# Patient Record
Sex: Female | Born: 1980 | Race: White | Hispanic: No | Smoking: Current every day smoker
Health system: Southern US, Community
[De-identification: ages and names within clinical notes are randomized; demographics above are authoritative.]

## PROBLEM LIST (undated history)

## (undated) DIAGNOSIS — M797 Fibromyalgia: Secondary | ICD-10-CM

## (undated) DIAGNOSIS — J45909 Unspecified asthma, uncomplicated: Secondary | ICD-10-CM

## (undated) DIAGNOSIS — F319 Bipolar disorder, unspecified: Secondary | ICD-10-CM

## (undated) DIAGNOSIS — F4310 Post-traumatic stress disorder, unspecified: Secondary | ICD-10-CM

## (undated) DIAGNOSIS — G2581 Restless legs syndrome: Secondary | ICD-10-CM

## (undated) DIAGNOSIS — F603 Borderline personality disorder: Secondary | ICD-10-CM

## (undated) DIAGNOSIS — M329 Systemic lupus erythematosus, unspecified: Secondary | ICD-10-CM

## (undated) DIAGNOSIS — F419 Anxiety disorder, unspecified: Secondary | ICD-10-CM

## (undated) DIAGNOSIS — J020 Streptococcal pharyngitis: Secondary | ICD-10-CM

## (undated) DIAGNOSIS — F32A Depression, unspecified: Secondary | ICD-10-CM

## (undated) DIAGNOSIS — R42 Dizziness and giddiness: Secondary | ICD-10-CM

## (undated) DIAGNOSIS — H6690 Otitis media, unspecified, unspecified ear: Secondary | ICD-10-CM

## (undated) DIAGNOSIS — IMO0002 Reserved for concepts with insufficient information to code with codable children: Secondary | ICD-10-CM

## (undated) DIAGNOSIS — F329 Major depressive disorder, single episode, unspecified: Secondary | ICD-10-CM

## (undated) DIAGNOSIS — M1990 Unspecified osteoarthritis, unspecified site: Secondary | ICD-10-CM

## (undated) DIAGNOSIS — N920 Excessive and frequent menstruation with regular cycle: Secondary | ICD-10-CM

## (undated) HISTORY — PX: TYMPANOSTOMY: SHX2586

## (undated) HISTORY — PX: TUBAL LIGATION: SHX77

## (undated) HISTORY — DX: Excessive and frequent menstruation with regular cycle: N92.0

## (undated) HISTORY — PX: CHOLECYSTECTOMY: SHX55

## (undated) HISTORY — PX: ABDOMINAL HYSTERECTOMY: SHX81

## (undated) HISTORY — PX: TONSILLECTOMY: SUR1361

## (undated) HISTORY — DX: Fibromyalgia: M79.7

## (undated) HISTORY — DX: Restless legs syndrome: G25.81

---

## 2008-07-17 ENCOUNTER — Emergency Department (HOSPITAL_BASED_OUTPATIENT_CLINIC_OR_DEPARTMENT_OTHER): Admission: EM | Admit: 2008-07-17 | Discharge: 2008-07-17 | Payer: Self-pay | Admitting: Emergency Medicine

## 2008-07-17 ENCOUNTER — Ambulatory Visit: Payer: Self-pay | Admitting: Diagnostic Radiology

## 2009-02-28 ENCOUNTER — Emergency Department (HOSPITAL_BASED_OUTPATIENT_CLINIC_OR_DEPARTMENT_OTHER): Admission: EM | Admit: 2009-02-28 | Discharge: 2009-03-01 | Payer: Self-pay | Admitting: Emergency Medicine

## 2009-03-01 ENCOUNTER — Ambulatory Visit: Payer: Self-pay | Admitting: Interventional Radiology

## 2010-01-23 ENCOUNTER — Emergency Department (HOSPITAL_BASED_OUTPATIENT_CLINIC_OR_DEPARTMENT_OTHER): Admission: EM | Admit: 2010-01-23 | Discharge: 2010-01-24 | Payer: Self-pay | Admitting: Emergency Medicine

## 2010-01-23 ENCOUNTER — Ambulatory Visit: Payer: Self-pay | Admitting: Diagnostic Radiology

## 2010-06-04 ENCOUNTER — Emergency Department (HOSPITAL_BASED_OUTPATIENT_CLINIC_OR_DEPARTMENT_OTHER)
Admission: EM | Admit: 2010-06-04 | Discharge: 2010-06-04 | Disposition: A | Discharge status: Home / Self Care | Payer: Self-pay | Attending: Emergency Medicine | Admitting: Emergency Medicine

## 2010-06-04 DIAGNOSIS — F319 Bipolar disorder, unspecified: Secondary | ICD-10-CM | POA: Insufficient documentation

## 2010-06-04 DIAGNOSIS — J069 Acute upper respiratory infection, unspecified: Secondary | ICD-10-CM | POA: Insufficient documentation

## 2010-06-04 DIAGNOSIS — J029 Acute pharyngitis, unspecified: Secondary | ICD-10-CM | POA: Insufficient documentation

## 2010-06-04 DIAGNOSIS — J3489 Other specified disorders of nose and nasal sinuses: Secondary | ICD-10-CM | POA: Insufficient documentation

## 2010-06-04 DIAGNOSIS — F17200 Nicotine dependence, unspecified, uncomplicated: Secondary | ICD-10-CM | POA: Insufficient documentation

## 2010-06-06 LAB — URINALYSIS, ROUTINE W REFLEX MICROSCOPIC
Bilirubin Urine: NEGATIVE
Glucose, UA: NEGATIVE mg/dL
Hgb urine dipstick: NEGATIVE
Nitrite: NEGATIVE
Specific Gravity, Urine: 1.01 (ref 1.005–1.030)
pH: 5.5 (ref 5.0–8.0)

## 2010-06-06 LAB — URINE MICROSCOPIC-ADD ON

## 2010-06-06 LAB — DIFFERENTIAL
Basophils Relative: 1 % (ref 0–1)
Eosinophils Absolute: 0.5 10*3/uL (ref 0.0–0.7)
Lymphs Abs: 2.8 10*3/uL (ref 0.7–4.0)
Neutrophils Relative %: 60 % (ref 43–77)

## 2010-06-06 LAB — COMPREHENSIVE METABOLIC PANEL
ALT: 20 U/L (ref 0–35)
AST: 20 U/L (ref 0–37)
CO2: 24 mEq/L (ref 19–32)
Calcium: 10.1 mg/dL (ref 8.4–10.5)
GFR calc Af Amer: >60 mL/min (ref >60)
Sodium: 146 mEq/L — ABNORMAL HIGH (ref 135–145)
Total Protein: 8.7 g/dL — ABNORMAL HIGH (ref 6.0–8.3)

## 2010-06-06 LAB — PREGNANCY, URINE: Preg Test, Ur: NEGATIVE

## 2010-06-06 LAB — WET PREP, GENITAL: Trich, Wet Prep: NONE SEEN

## 2010-06-06 LAB — CBC
MCV: 90.5 fL (ref 78.0–100.0)
Platelets: 326 10*3/uL (ref 150–400)
WBC: 10.4 10*3/uL (ref 4.0–10.5)

## 2010-06-10 ENCOUNTER — Emergency Department (HOSPITAL_BASED_OUTPATIENT_CLINIC_OR_DEPARTMENT_OTHER)
Admission: EM | Admit: 2010-06-10 | Discharge: 2010-06-10 | Disposition: A | Discharge status: Home / Self Care | Payer: Self-pay | Attending: Emergency Medicine | Admitting: Emergency Medicine

## 2010-06-10 DIAGNOSIS — F319 Bipolar disorder, unspecified: Secondary | ICD-10-CM | POA: Insufficient documentation

## 2010-06-10 DIAGNOSIS — H9209 Otalgia, unspecified ear: Secondary | ICD-10-CM | POA: Insufficient documentation

## 2010-07-28 ENCOUNTER — Emergency Department (HOSPITAL_BASED_OUTPATIENT_CLINIC_OR_DEPARTMENT_OTHER)
Admission: EM | Admit: 2010-07-28 | Discharge: 2010-07-28 | Disposition: A | Discharge status: Home / Self Care | Payer: Self-pay | Attending: Emergency Medicine | Admitting: Emergency Medicine

## 2010-07-28 DIAGNOSIS — R51 Headache: Secondary | ICD-10-CM | POA: Insufficient documentation

## 2010-07-28 DIAGNOSIS — F319 Bipolar disorder, unspecified: Secondary | ICD-10-CM | POA: Insufficient documentation

## 2010-10-31 ENCOUNTER — Emergency Department (HOSPITAL_BASED_OUTPATIENT_CLINIC_OR_DEPARTMENT_OTHER)
Admission: EM | Admit: 2010-10-31 | Discharge: 2010-10-31 | Disposition: A | Discharge status: Home / Self Care | Payer: Self-pay | Attending: Emergency Medicine | Admitting: Emergency Medicine

## 2010-10-31 DIAGNOSIS — R110 Nausea: Secondary | ICD-10-CM | POA: Insufficient documentation

## 2010-10-31 DIAGNOSIS — F17200 Nicotine dependence, unspecified, uncomplicated: Secondary | ICD-10-CM | POA: Insufficient documentation

## 2010-10-31 DIAGNOSIS — R51 Headache: Secondary | ICD-10-CM | POA: Insufficient documentation

## 2010-10-31 DIAGNOSIS — F3289 Other specified depressive episodes: Secondary | ICD-10-CM | POA: Insufficient documentation

## 2010-10-31 DIAGNOSIS — F329 Major depressive disorder, single episode, unspecified: Secondary | ICD-10-CM | POA: Insufficient documentation

## 2010-10-31 HISTORY — DX: Depression, unspecified: F32.A

## 2010-10-31 HISTORY — DX: Major depressive disorder, single episode, unspecified: F32.9

## 2010-10-31 MED ORDER — METOCLOPRAMIDE HCL 5 MG/ML IJ SOLN
10.0000 mg | Freq: Once | INTRAMUSCULAR | Status: AC
Start: 2010-10-31 — End: 2010-10-31
  Administered 2010-10-31: 10 mg via INTRAMUSCULAR
  Filled 2010-10-31: qty 2

## 2010-10-31 MED ORDER — KETOROLAC TROMETHAMINE 60 MG/2ML IM SOLN
60.0000 mg | Freq: Once | INTRAMUSCULAR | Status: AC
Start: 2010-10-31 — End: 2010-10-31
  Administered 2010-10-31: 60 mg via INTRAMUSCULAR
  Filled 2010-10-31: qty 2

## 2010-10-31 MED ORDER — DIPHENHYDRAMINE HCL 50 MG/ML IJ SOLN
25.0000 mg | Freq: Once | INTRAMUSCULAR | Status: AC
  Administered 2010-10-31: 50 mg via INTRAMUSCULAR
  Filled 2010-10-31: qty 1

## 2010-10-31 NOTE — ED Provider Notes (Signed)
Medical screening examination/treatment/procedure(s) were performed by non-physician practitioner and as supervising physician I was immediately available for consultation/collaboration.   Eraina Winnie B. Bernette [name], MD 10/31/10 1610

## 2010-10-31 NOTE — ED Notes (Signed)
Migraine headache since yesterday; nausea and light sensitivity

## 2010-10-31 NOTE — ED Provider Notes (Signed)
History     CSN: [id number] Arrival date & time: 10/31/2010  5:40 PM  Chief Complaint  Patient presents with  . Migraine   Patient is a 30 y.o. female presenting with headaches. The history is provided by the patient. No language interpreter was used.  Headache  This is a recurrent problem. The current episode started 6 to 12 hours ago. The problem occurs constantly. The problem has not changed since onset.The headache is associated with nothing. The pain is located in the right unilateral region. The quality of the pain is described as throbbing. The pain is moderate. The pain does not radiate. Associated symptoms include a fever, chest pressure and nausea. Pertinent negatives include no syncope and no vomiting. She has tried aspirin for the symptoms. The treatment provided no relief.    Past Medical History  Diagnosis Date  . Migraine   . Depression     Past Surgical History  Procedure Date  . Tonsillectomy   . Cholecystectomy   . Exploratory laparotomy   . Tubal ligation     No family history on file.  History  Substance Use Topics  . Smoking status: Current Everyday Smoker -- 1.0 packs/day  . Smokeless tobacco: Not on file  . Alcohol Use: No    OB History    Grav Para Term Preterm Abortions TAB SAB Ect Mult Living                  Review of Systems  Constitutional: Positive for fever.  Cardiovascular: Negative for syncope.  Gastrointestinal: Positive for nausea. Negative for vomiting.  Neurological: Positive for headaches.  All other systems reviewed and are negative.    Physical Exam  BP 128/70  Pulse 81  Temp(Src) 99.2 F (37.3 C) (Oral)  Resp 16  Ht 5\' 1"  (1.549 m)  Wt 165 lb (74.844 kg)  BMI 31.18 kg/m2  SpO2 99%  LMP 10/24/2010  Physical Exam  Nursing note and vitals reviewed. HENT:  Head: Normocephalic and atraumatic.  Eyes: Pupils are equal, round, and reactive to light.  Neck: Normal range of motion. Neck supple.  Cardiovascular:  Regular rhythm.   Pulmonary/Chest: Effort normal and breath sounds normal.  Abdominal: Soft.  Musculoskeletal: Normal range of motion.  Neurological: She is alert.  Skin: Skin is warm and dry.    ED Course  Procedures  MDM Pt is feeling a lot better at this time      Teressa Lower, NP 10/31/10 1840

## 2011-03-05 ENCOUNTER — Emergency Department (HOSPITAL_BASED_OUTPATIENT_CLINIC_OR_DEPARTMENT_OTHER)
Admission: EM | Admit: 2011-03-05 | Discharge: 2011-03-05 | Disposition: A | Discharge status: Home / Self Care | Payer: Self-pay | Attending: Emergency Medicine | Admitting: Emergency Medicine

## 2011-03-05 ENCOUNTER — Encounter (HOSPITAL_BASED_OUTPATIENT_CLINIC_OR_DEPARTMENT_OTHER): Payer: Self-pay

## 2011-03-05 DIAGNOSIS — H9209 Otalgia, unspecified ear: Secondary | ICD-10-CM | POA: Insufficient documentation

## 2011-03-05 DIAGNOSIS — F17200 Nicotine dependence, unspecified, uncomplicated: Secondary | ICD-10-CM | POA: Insufficient documentation

## 2011-03-05 DIAGNOSIS — J069 Acute upper respiratory infection, unspecified: Secondary | ICD-10-CM | POA: Insufficient documentation

## 2011-03-05 MED ORDER — OXYMETAZOLINE HCL 0.05 % NA SOLN
NASAL | Status: AC
  Administered 2011-03-05: 1 via NASAL
  Filled 2011-03-05: qty 15

## 2011-03-05 MED ORDER — OXYMETAZOLINE HCL 0.05 % NA SOLN
1.0000 | Freq: Once | NASAL | Status: AC
  Administered 2011-03-05: 1 via NASAL

## 2011-03-05 NOTE — ED Provider Notes (Signed)
History    This chart was scribed for Hilario Quarry, MD, MD by Smitty Pluck. The patient was seen in room MHCT1 and the patient's care was started at 3:09PM.   CSN: [id number]  Arrival date & time 03/05/11  1419   First MD Initiated Contact with Patient 03/05/11 1506      Chief Complaint  Patient presents with  . Otalgia  . URI    (Consider location/radiation/quality/duration/timing/severity/associated sxs/prior treatment) The history is provided by the patient.   Tiffany Whitney is a 30 y.o. female who presents to the Emergency Department complaining of moderate right otalgia and persistent cough onset 6 days ago. Pt  Has been using Mucinex for cough with minor relief. The symptoms have been constant since the onset. There is no radiation. Pt is taking Abilify, clafifil and prestik.   Past Medical History  Diagnosis Date  . Migraine   . Depression     Past Surgical History  Procedure Date  . Tonsillectomy   . Cholecystectomy   . Exploratory laparotomy   . Tubal ligation     No family history on file.  History  Substance Use Topics  . Smoking status: Current Everyday Smoker -- 1.0 packs/day  . Smokeless tobacco: Not on file  . Alcohol Use: No    OB History    Grav Para Term Preterm Abortions TAB SAB Ect Mult Living                  Review of Systems  All other systems reviewed and are negative.  10 Systems reviewed and are negative for acute change except as noted in the HPI.   Allergies  Morphine and related  Home Medications   Current Outpatient Rx  Name Route Sig Dispense Refill  . ARIPIPRAZOLE 5 MG PO TABS Oral Take 5 mg by mouth daily.      Marlin Canary HEADACHE PO Oral Take 1 packet by mouth once.      . DESVENLAFAXINE SUCCINATE ER 50 MG PO TB24 Oral Take 50 mg by mouth daily.      . QUETIAPINE FUMARATE 100 MG PO TABS Oral Take 150 mg by mouth at bedtime.        BP 137/85  Pulse 96  Temp(Src) 97.7 F (36.5 C) (Oral)  Resp 16  Ht 5\' 1"  (1.549  m)  Wt 200 lb (90.719 kg)  BMI 37.79 kg/m2  SpO2 97%  LMP 03/03/2011  Physical Exam  Nursing note and vitals reviewed. Constitutional: She is oriented to person, place, and time. She appears well-developed and well-nourished. No distress.  HENT:  Head: Normocephalic and atraumatic.  Eyes: Conjunctivae and EOM are normal. Pupils are equal, round, and reactive to light.  Neck: Normal range of motion. Neck supple.  Cardiovascular: Normal rate, regular rhythm and normal heart sounds.   Pulmonary/Chest: Effort normal and breath sounds normal. No respiratory distress.  Abdominal: She exhibits no distension.  Musculoskeletal: Normal range of motion. She exhibits no edema.  Neurological: She is alert and oriented to person, place, and time.  Skin: Skin is warm and dry.  Psychiatric: She has a normal mood and affect. Her behavior is normal.    ED Course  Procedures (including critical care time)  DIAGNOSTIC STUDIES: Oxygen Saturation is 98% on room air, normal by my interpretation.    COORDINATION OF CARE:    Labs Reviewed - No data to display No results found.   No diagnosis found.    Hilario Quarry, MD  I personally performed the services described in this documentation, which was scribed in my presence. The recorded information has been reviewed and considered.      Hilario Quarry, MD 03/05/11 [phone]

## 2011-03-05 NOTE — ED Notes (Signed)
Right earache, cough started 12/25

## 2011-05-04 ENCOUNTER — Emergency Department (HOSPITAL_BASED_OUTPATIENT_CLINIC_OR_DEPARTMENT_OTHER)
Admission: EM | Admit: 2011-05-04 | Discharge: 2011-05-04 | Disposition: A | Discharge status: Home / Self Care | Payer: Self-pay | Attending: Emergency Medicine | Admitting: Emergency Medicine

## 2011-05-04 ENCOUNTER — Encounter (HOSPITAL_BASED_OUTPATIENT_CLINIC_OR_DEPARTMENT_OTHER): Payer: Self-pay | Admitting: Emergency Medicine

## 2011-05-04 DIAGNOSIS — H9209 Otalgia, unspecified ear: Secondary | ICD-10-CM | POA: Insufficient documentation

## 2011-05-04 DIAGNOSIS — H6690 Otitis media, unspecified, unspecified ear: Secondary | ICD-10-CM | POA: Insufficient documentation

## 2011-05-04 DIAGNOSIS — F17200 Nicotine dependence, unspecified, uncomplicated: Secondary | ICD-10-CM | POA: Insufficient documentation

## 2011-05-04 DIAGNOSIS — J029 Acute pharyngitis, unspecified: Secondary | ICD-10-CM | POA: Insufficient documentation

## 2011-05-04 MED ORDER — AMOXICILLIN 500 MG PO CAPS: 500.0000 mg | ORAL_CAPSULE | Freq: Three times a day (TID) | ORAL | Status: AC

## 2011-05-04 MED ORDER — HYDROCODONE-ACETAMINOPHEN 5-325 MG PO TABS: 2.0000 | ORAL_TABLET | ORAL | Status: AC | PRN

## 2011-05-04 NOTE — ED Notes (Signed)
Pt c/o sore throat, right ear pain, congestion and fever since mon.

## 2011-05-04 NOTE — ED Provider Notes (Signed)
History     CSN: [id number]  Arrival date & time 05/04/11  1859   First MD Initiated Contact with Patient 05/04/11 1925      Chief Complaint  Patient presents with  . Sore Throat  . Fever  . Nasal Congestion  . Otalgia    (Consider location/radiation/quality/duration/timing/severity/associated sxs/prior treatment) Patient is a 31 y.o. female presenting with pharyngitis, fever, and ear pain. The history is provided by the patient. No language interpreter was used.  Sore Throat This is a new problem. The current episode started in the past 7 days. The problem occurs daily. The problem has been gradually worsening. Associated symptoms include a fever and a sore throat. The symptoms are aggravated by nothing. She has tried nothing for the symptoms. The treatment provided moderate relief.  Fever Primary symptoms of the febrile illness include fever.  Otalgia Associated symptoms include sore throat.   Pt complains of pain in bilat ears right greater than left.   Pt complains of a sorethroat Past Medical History  Diagnosis Date  . Migraine   . Depression     Past Surgical History  Procedure Date  . Tonsillectomy   . Cholecystectomy   . Exploratory laparotomy   . Tubal ligation   . Exploratory laparotomy     No family history on file.  History  Substance Use Topics  . Smoking status: Current Everyday Smoker -- 1.0 packs/day  . Smokeless tobacco: Not on file  . Alcohol Use: No    OB History    Grav Para Term Preterm Abortions TAB SAB Ect Mult Living                  Review of Systems  Constitutional: Positive for fever.  HENT: Positive for ear pain and sore throat.   All other systems reviewed and are negative.    Allergies  Morphine and related  Home Medications   Current Outpatient Rx  Name Route Sig Dispense Refill  . ARIPIPRAZOLE 5 MG PO TABS Oral Take 5 mg by mouth daily.      Marlin Canary HEADACHE PO Oral Take 1 packet by mouth once.      . ASPIRIN  EFFERVESCENT 325 MG PO TBEF Oral Take 325 mg by mouth every 6 (six) hours as needed. Patient took this medication for flu symptoms.    . DESVENLAFAXINE SUCCINATE ER 50 MG PO TB24 Oral Take 50 mg by mouth daily.      . QUETIAPINE FUMARATE 100 MG PO TABS Oral Take 150 mg by mouth at bedtime.        BP 117/76  Pulse 100  Temp(Src) 98.5 F (36.9 C) (Oral)  Resp 18  Wt 192 lb (87.091 kg)  SpO2 100%  Physical Exam  Vitals reviewed. Constitutional: She is oriented to person, place, and time. She appears well-developed and well-nourished.  HENT:  Head: Normocephalic and atraumatic.  Nose: Nose normal.  Mouth/Throat: No oropharyngeal exudate.       Right tm erythematous, fluid,  Throat erythematous  Eyes: Conjunctivae and EOM are normal. Pupils are equal, round, and reactive to light.  Neck: Normal range of motion. Neck supple.  Cardiovascular: Normal rate.   Pulmonary/Chest: Effort normal.  Abdominal: Soft.  Musculoskeletal: Normal range of motion.  Neurological: She is alert and oriented to person, place, and time. She has normal reflexes.  Skin: Skin is warm.  Psychiatric: She has a normal mood and affect.    ED Course  Procedures (including critical care time)  Labs Reviewed - No data to display No results found.   No diagnosis found.    MDM   Pt given 2 vicodin for pain.  Pt given rx for amoxicillian and hydrocodone  Langston Masker, [place] 05/04/11 2013

## 2011-05-04 NOTE — Discharge Instructions (Signed)

## 2011-05-04 NOTE — ED Provider Notes (Signed)
Medical screening examination/treatment/procedure(s) were performed by non-physician practitioner and as supervising physician I was immediately available for consultation/collaboration.   Denissa Cozart A Winfred Redel, MD 05/04/11 2349 

## 2011-06-03 ENCOUNTER — Encounter (HOSPITAL_BASED_OUTPATIENT_CLINIC_OR_DEPARTMENT_OTHER): Payer: Self-pay | Admitting: *Deleted

## 2011-06-03 ENCOUNTER — Emergency Department (HOSPITAL_BASED_OUTPATIENT_CLINIC_OR_DEPARTMENT_OTHER)
Admission: EM | Admit: 2011-06-03 | Discharge: 2011-06-03 | Disposition: A | Discharge status: Home / Self Care | Payer: Self-pay | Attending: Emergency Medicine | Admitting: Emergency Medicine

## 2011-06-03 DIAGNOSIS — G43909 Migraine, unspecified, not intractable, without status migrainosus: Secondary | ICD-10-CM | POA: Insufficient documentation

## 2011-06-03 MED ORDER — DIPHENHYDRAMINE HCL 50 MG/ML IJ SOLN
25.0000 mg | Freq: Once | INTRAMUSCULAR | Status: AC
  Administered 2011-06-03: 25 mg via INTRAVENOUS
  Filled 2011-06-03: qty 1

## 2011-06-03 MED ORDER — SODIUM CHLORIDE 0.9 % IV SOLN
Freq: Once | INTRAVENOUS | Status: AC
  Administered 2011-06-03: 03:00:00 via INTRAVENOUS

## 2011-06-03 MED ORDER — METOCLOPRAMIDE HCL 5 MG/ML IJ SOLN
10.0000 mg | Freq: Once | INTRAMUSCULAR | Status: AC
  Administered 2011-06-03: 10 mg via INTRAVENOUS
  Filled 2011-06-03: qty 2

## 2011-06-03 MED ORDER — KETOROLAC TROMETHAMINE 30 MG/ML IJ SOLN
30.0000 mg | Freq: Once | INTRAMUSCULAR | Status: AC
  Administered 2011-06-03: 30 mg via INTRAVENOUS
  Filled 2011-06-03: qty 1

## 2011-06-03 NOTE — Discharge Instructions (Signed)

## 2011-06-03 NOTE — ED Notes (Signed)
Patient states that she has a migraine. Took her migraine m.edicine at 8pm and that it has not helped her pain.

## 2011-06-03 NOTE — ED Provider Notes (Signed)
History     CSN: [id number]  Arrival date & time 06/03/11  0211   First MD Initiated Contact with Patient 06/03/11 0245      Chief Complaint  Patient presents with  . Headache    (Consider location/radiation/quality/duration/timing/severity/associated sxs/prior treatment) HPI Is a 31 year old white female with a history of migraines. She developed a migraine headache yesterday evening about 6:30 PM. It is on the right side of her head and is characterized as a throbbing pain. It is moderate to severe. She took Fioricet at 8 PM and went to bed. When she awoke the headache was still present so she presents here for further treatment. The pain is exacerbated by to light. She is nauseated but not vomiting. There's no focal neurologic deficit.  Past Medical History  Diagnosis Date  . Migraine   . Depression     Past Surgical History  Procedure Date  . Tonsillectomy   . Cholecystectomy   . Exploratory laparotomy   . Tubal ligation   . Exploratory laparotomy     No family history on file.  History  Substance Use Topics  . Smoking status: Current Everyday Smoker -- 1.0 packs/day  . Smokeless tobacco: Not on file  . Alcohol Use: No    OB History    Grav Para Term Preterm Abortions TAB SAB Ect Mult Living                  Review of Systems  All other systems reviewed and are negative.    Allergies  Morphine and related  Home Medications   Current Outpatient Rx  Name Route Sig Dispense Refill  . BUTALBITAL-APAP-CAFF-COD 50-325-40-30 MG PO CAPS Oral Take 1 capsule by mouth every 4 (four) hours as needed.    Marland Kitchen PROCHLORPERAZINE MALEATE 5 MG PO TABS Oral Take 5 mg by mouth every 6 (six) hours as needed.    . ARIPIPRAZOLE 5 MG PO TABS Oral Take 5 mg by mouth daily.      Marlin Canary HEADACHE PO Oral Take 1 packet by mouth once.      . ASPIRIN EFFERVESCENT 325 MG PO TBEF Oral Take 325 mg by mouth every 6 (six) hours as needed. Patient took this medication for flu symptoms.     . DESVENLAFAXINE SUCCINATE ER 50 MG PO TB24 Oral Take 50 mg by mouth daily.      . QUETIAPINE FUMARATE 100 MG PO TABS Oral Take 150 mg by mouth at bedtime.        BP 119/74  Pulse 89  Temp(Src) 98.2 F (36.8 C) (Oral)  Resp 18  SpO2 99%  Physical Exam General: Well-developed, well-nourished female in no acute distress; appearance consistent with age of record HENT: normocephalic, atraumatic Eyes: pupils equal round and reactive to light; extraocular muscles intact; photophobia Neck: supple Heart: regular rate and rhythm Lungs: clear to auscultation bilaterally Abdomen: soft; nondistended; nontender Extremities: No deformity; full range of motion Neurologic: Awake, alert and oriented; motor function intact in all extremities and symmetric; no facial droop Skin: Warm and dry     ED Course  Procedures (including critical care time)    MDM  3:31 AM Feels better after IV meds.   Hanley Seamen, MD 06/03/11 [phone]

## 2011-07-15 ENCOUNTER — Emergency Department (HOSPITAL_BASED_OUTPATIENT_CLINIC_OR_DEPARTMENT_OTHER)
Admission: EM | Admit: 2011-07-15 | Discharge: 2011-07-15 | Disposition: A | Discharge status: Home / Self Care | Payer: Self-pay | Attending: Emergency Medicine | Admitting: Emergency Medicine

## 2011-07-15 ENCOUNTER — Encounter (HOSPITAL_BASED_OUTPATIENT_CLINIC_OR_DEPARTMENT_OTHER): Payer: Self-pay | Admitting: *Deleted

## 2011-07-15 DIAGNOSIS — F17200 Nicotine dependence, unspecified, uncomplicated: Secondary | ICD-10-CM | POA: Insufficient documentation

## 2011-07-15 DIAGNOSIS — R112 Nausea with vomiting, unspecified: Secondary | ICD-10-CM | POA: Insufficient documentation

## 2011-07-15 DIAGNOSIS — R51 Headache: Secondary | ICD-10-CM | POA: Insufficient documentation

## 2011-07-15 LAB — URINALYSIS, ROUTINE W REFLEX MICROSCOPIC
Bilirubin Urine: NEGATIVE
Nitrite: NEGATIVE
Protein, ur: NEGATIVE mg/dL
Specific Gravity, Urine: 1.022 (ref 1.005–1.030)
Urobilinogen, UA: 0.2 mg/dL (ref 0.0–1.0)

## 2011-07-15 LAB — URINE MICROSCOPIC-ADD ON

## 2011-07-15 LAB — BASIC METABOLIC PANEL
BUN: 10 mg/dL (ref 6–23)
Calcium: 10.3 mg/dL (ref 8.4–10.5)
GFR calc Af Amer: >90 mL/min (ref >90)
GFR calc non Af Amer: 84 mL/min — ABNORMAL LOW (ref >90)
Potassium: 3.6 mEq/L (ref 3.5–5.1)
Sodium: 140 mEq/L (ref 135–145)

## 2011-07-15 LAB — DIFFERENTIAL
Basophils Relative: 1 % (ref 0–1)
Eosinophils Absolute: 0.9 10*3/uL — ABNORMAL HIGH (ref 0.0–0.7)
Neutrophils Relative %: 57 % (ref 43–77)

## 2011-07-15 LAB — CBC
MCH: 31.2 pg (ref 26.0–34.0)
MCHC: 35.1 g/dL (ref 30.0–36.0)
Platelets: 352 10*3/uL (ref 150–400)

## 2011-07-15 MED ORDER — KETOROLAC TROMETHAMINE 30 MG/ML IJ SOLN
30.0000 mg | Freq: Once | INTRAMUSCULAR | Status: AC
  Administered 2011-07-15: 30 mg via INTRAVENOUS
  Filled 2011-07-15: qty 1

## 2011-07-15 MED ORDER — ONDANSETRON HCL 4 MG/2ML IJ SOLN
4.0000 mg | Freq: Once | INTRAMUSCULAR | Status: AC
  Administered 2011-07-15: 4 mg via INTRAVENOUS
  Filled 2011-07-15: qty 2

## 2011-07-15 MED ORDER — KETOROLAC TROMETHAMINE 60 MG/2ML IM SOLN: 60.0000 mg | Freq: Once | INTRAMUSCULAR | Status: DC

## 2011-07-15 MED ORDER — DIPHENHYDRAMINE HCL 50 MG/ML IJ SOLN
25.0000 mg | Freq: Once | INTRAMUSCULAR | Status: AC
  Administered 2011-07-15: 25 mg via INTRAVENOUS
  Filled 2011-07-15: qty 1

## 2011-07-15 MED ORDER — ONDANSETRON 8 MG PO TBDP: 8.0000 mg | ORAL_TABLET | Freq: Three times a day (TID) | ORAL | Status: AC | PRN

## 2011-07-15 MED ORDER — SODIUM CHLORIDE 0.9 % IV BOLUS (SEPSIS)
1000.0000 mL | Freq: Once | INTRAVENOUS | Status: AC
  Administered 2011-07-15: 1000 mL via INTRAVENOUS

## 2011-07-15 NOTE — Discharge Instructions (Signed)
Nausea and Vomiting  Nausea is a sick feeling that often comes before throwing up (vomiting). Vomiting is a reflex where stomach contents come out of your mouth. Vomiting can cause severe loss of body fluids (dehydration). Children and elderly adults can become dehydrated quickly, especially if they also have diarrhea. Nausea and vomiting are symptoms of a condition or disease. It is important to find the cause of your symptoms.  CAUSES    Direct irritation of the stomach lining. This irritation can result from increased acid production (gastroesophageal reflux disease), infection, food poisoning, taking certain medicines (such as nonsteroidal anti-inflammatory drugs), alcohol use, or tobacco use.   Signals from the brain.These signals could be caused by a headache, heat exposure, an inner ear disturbance, increased pressure in the brain from injury, infection, a tumor, or a concussion, pain, emotional stimulus, or metabolic problems.   An obstruction in the gastrointestinal tract (bowel obstruction).   Illnesses such as diabetes, hepatitis, gallbladder problems, appendicitis, kidney problems, cancer, sepsis, atypical symptoms of a heart attack, or eating disorders.   Medical treatments such as chemotherapy and radiation.   Receiving medicine that makes you sleep (general anesthetic) during surgery.  DIAGNOSIS  Your caregiver may ask for tests to be done if the problems do not improve after a few days. Tests may also be done if symptoms are severe or if the reason for the nausea and vomiting is not clear. Tests may include:   Urine tests.   Blood tests.   Stool tests.   Cultures (to look for evidence of infection).   X-rays or other imaging studies.  Test results can help your caregiver make decisions about treatment or the need for additional tests.  TREATMENT  You need to stay well hydrated. Drink frequently but in small amounts.You may wish to drink water, sports drinks, clear broth, or eat frozen  ice pops or gelatin dessert to help stay hydrated.When you eat, eating slowly may help prevent nausea.There are also some antinausea medicines that may help prevent nausea.  HOME CARE INSTRUCTIONS    Take all medicine as directed by your caregiver.   If you do not have an appetite, do not force yourself to eat. However, you must continue to drink fluids.   If you have an appetite, eat a normal diet unless your caregiver tells you differently.   Eat a variety of complex carbohydrates (rice, wheat, potatoes, bread), lean meats, yogurt, fruits, and vegetables.   Avoid high-fat foods because they are more difficult to digest.   Drink enough water and fluids to keep your urine clear or pale yellow.   If you are dehydrated, ask your caregiver for specific rehydration instructions. Signs of dehydration may include:   Severe thirst.   Dry lips and mouth.   Dizziness.   Dark urine.   Decreasing urine frequency and amount.   Confusion.   Rapid breathing or pulse.  SEEK IMMEDIATE MEDICAL CARE IF:    You have blood or brown flecks (like coffee grounds) in your vomit.   You have black or bloody stools.   You have a severe headache or stiff neck.   You are confused.   You have severe abdominal pain.   You have chest pain or trouble breathing.   You do not urinate at least once every 8 hours.   You develop cold or clammy skin.   You continue to vomit for longer than 24 to 48 hours.   You have a fever.  MAKE SURE YOU:      Understand these instructions.   Will watch your condition.   Will get help right away if you are not doing well or get worse.  Document Released: 02/20/2005 Document Revised: 02/09/2011 Document Reviewed: 07/20/2010  [employer] Patient Information 2012 [employer]

## 2011-07-15 NOTE — ED Notes (Signed)
Pt states that she has been vomiting since last PM and today has" a really bad headache" states that "i took a pregnancy test last night and it was negative" her MP is not late

## 2011-07-15 NOTE — ED Provider Notes (Signed)
History   .This chart was scribed for Tiffany Quarry, MD by Shari Heritage. The patient was seen in room MH02/MH02. Patient's care was started at 2118.  CSN: [id number]  Arrival date & time 07/15/11  2118   First MD Initiated Contact with Patient 07/15/11 2149      Chief Complaint  Patient presents with  . Emesis  . Headache     Patient is a 31 y.o. female presenting with vomiting and headaches. The history is provided by the patient. No language interpreter was used.  Emesis  Associated symptoms include headaches.  Headache  Associated symptoms include nausea and vomiting.   Shaquasha Gerstel is a 31 y.o. female who presents to the Emergency Department complaining of sudden, persistent, episodic emesis onset yesterday evening. Patient reports an associated nHA onset 1 hour ago. Patient says she has been taking Lamictal recently for her bipolar episodes, but her physician advised her to stop because it was causing stiff neck. Patient denies diarrhea, chest pain, abdominal pain, neck pain, fever, visual disturbance and chills. Patient with h/o of migraine, depression and bipolar disorder. Patient is a current everyday smoker who reports that she smokes 1 pack a day.  Past Medical History  Diagnosis Date  . Migraine   . Depression     Past Surgical History  Procedure Date  . Tonsillectomy   . Cholecystectomy   . Exploratory laparotomy   . Tubal ligation   . Exploratory laparotomy     History reviewed. No pertinent family history.  History  Substance Use Topics  . Smoking status: Current Everyday Smoker -- 1.0 packs/day  . Smokeless tobacco: Not on file  . Alcohol Use: No    OB History    Grav Para Term Preterm Abortions TAB SAB Ect Mult Living                  Review of Systems  Gastrointestinal: Positive for nausea and vomiting.  Neurological: Positive for headaches.  All other systems reviewed and are negative.   Allergies  Morphine and related  Home Medications    Current Outpatient Rx  Name Route Sig Dispense Refill  . ARIPIPRAZOLE 5 MG PO TABS Oral Take 5 mg by mouth daily.      Marlin Canary HEADACHE PO Oral Take 1 packet by mouth once.      . ASPIRIN EFFERVESCENT 325 MG PO TBEF Oral Take 325 mg by mouth every 6 (six) hours as needed. Patient took this medication for flu symptoms.    . DESVENLAFAXINE SUCCINATE ER 50 MG PO TB24 Oral Take 50 mg by mouth daily.      Marland Kitchen LAMOTRIGINE 25 MG PO TABS Oral Take 25 mg by mouth daily.    . QUETIAPINE FUMARATE 100 MG PO TABS Oral Take 150 mg by mouth at bedtime.       BP 137/83  Pulse 94  Temp(Src) 99 F (37.2 C) (Oral)  Resp 20  SpO2 100%  Physical Exam  Nursing note and vitals reviewed. Constitutional: She is oriented to person, place, and time. She appears well-developed and well-nourished.  HENT:  Head: Normocephalic and atraumatic.  Eyes: Conjunctivae and EOM are normal. Pupils are equal, round, and reactive to light.  Neck: Normal range of motion. Neck supple.  Cardiovascular: Normal rate and regular rhythm.   Pulmonary/Chest: Effort normal and breath sounds normal.  Abdominal: Soft. Bowel sounds are normal.  Musculoskeletal: Normal range of motion.  Neurological: She is alert and oriented to person, place,  and time.  Skin: Skin is warm and dry.  Psychiatric: She has a normal mood and affect.    ED Course  Procedures (including critical care time) DIAGNOSTIC STUDIES: Oxygen Saturation is 100% on room air, normal by my interpretation.    COORDINATION OF CARE: 9:52PM- Patient informed of current plan for treatment and evaluation and agrees with plan at this time.     Labs Reviewed  CBC - Abnormal; Notable for the following:    WBC 15.4 (*)    All other components within normal limits  DIFFERENTIAL - Abnormal; Notable for the following:    Neutro Abs 8.8 (*)    Lymphs Abs 4.4 (*)    Monocytes Absolute 1.2 (*)    Eosinophils Relative 6 (*)    Eosinophils Absolute 0.9 (*)    All other  components within normal limits  BASIC METABOLIC PANEL - Abnormal; Notable for the following:    GFR calc non Af Amer 84 (*)    All other components within normal limits  URINALYSIS, ROUTINE W REFLEX MICROSCOPIC  PREGNANCY, URINE   No results found.   No diagnosis found.    Patient feels improved after medications. She has not any further vomiting here. She will be given Zofran for use at home. I personally performed the services described in this documentation, which was scribed in my presence. The recorded information has been reviewed and considered.        Tiffany Quarry, MD 07/15/11 [phone]

## 2011-08-25 ENCOUNTER — Encounter (HOSPITAL_BASED_OUTPATIENT_CLINIC_OR_DEPARTMENT_OTHER): Payer: Self-pay | Admitting: Emergency Medicine

## 2011-08-25 ENCOUNTER — Emergency Department (HOSPITAL_BASED_OUTPATIENT_CLINIC_OR_DEPARTMENT_OTHER)
Admission: EM | Admit: 2011-08-25 | Discharge: 2011-08-25 | Disposition: A | Discharge status: Home / Self Care | Payer: Self-pay | Attending: Emergency Medicine | Admitting: Emergency Medicine

## 2011-08-25 DIAGNOSIS — Z7982 Long term (current) use of aspirin: Secondary | ICD-10-CM | POA: Insufficient documentation

## 2011-08-25 DIAGNOSIS — N72 Inflammatory disease of cervix uteri: Secondary | ICD-10-CM | POA: Insufficient documentation

## 2011-08-25 DIAGNOSIS — F17200 Nicotine dependence, unspecified, uncomplicated: Secondary | ICD-10-CM | POA: Insufficient documentation

## 2011-08-25 DIAGNOSIS — F319 Bipolar disorder, unspecified: Secondary | ICD-10-CM | POA: Insufficient documentation

## 2011-08-25 HISTORY — DX: Bipolar disorder, unspecified: F31.9

## 2011-08-25 LAB — URINALYSIS, ROUTINE W REFLEX MICROSCOPIC
Glucose, UA: NEGATIVE mg/dL
Hgb urine dipstick: NEGATIVE
Specific Gravity, Urine: 1.018 (ref 1.005–1.030)
pH: 6.5 (ref 5.0–8.0)

## 2011-08-25 LAB — WET PREP, GENITAL: Trich, Wet Prep: NONE SEEN

## 2011-08-25 LAB — GC/CHLAMYDIA PROBE AMP, GENITAL
Chlamydia, DNA Probe: NEGATIVE
GC Probe Amp, Genital: NEGATIVE

## 2011-08-25 LAB — DIFFERENTIAL
Eosinophils Absolute: 0.9 10*3/uL — ABNORMAL HIGH (ref 0.0–0.7)
Lymphocytes Relative: 29 % (ref 12–46)
Lymphs Abs: 4.2 10*3/uL — ABNORMAL HIGH (ref 0.7–4.0)
Monocytes Relative: 8 % (ref 3–12)
Neutro Abs: 8.5 10*3/uL — ABNORMAL HIGH (ref 1.7–7.7)
Neutrophils Relative %: 57 % (ref 43–77)

## 2011-08-25 LAB — BASIC METABOLIC PANEL
CO2: 23 mEq/L (ref 19–32)
Chloride: 105 mEq/L (ref 96–112)
Glucose, Bld: 118 mg/dL — ABNORMAL HIGH (ref 70–99)
Potassium: 3.3 mEq/L — ABNORMAL LOW (ref 3.5–5.1)
Sodium: 138 mEq/L (ref 135–145)

## 2011-08-25 LAB — URINE MICROSCOPIC-ADD ON

## 2011-08-25 LAB — RPR: RPR Ser Ql: NONREACTIVE

## 2011-08-25 LAB — CBC
Hemoglobin: 14.8 g/dL (ref 12.0–15.0)
MCH: 31.9 pg (ref 26.0–34.0)
RBC: 4.64 MIL/uL (ref 3.87–5.11)
WBC: 14.9 10*3/uL — ABNORMAL HIGH (ref 4.0–10.5)

## 2011-08-25 MED ORDER — ONDANSETRON HCL 4 MG/2ML IJ SOLN
4.0000 mg | Freq: Once | INTRAMUSCULAR | Status: AC
  Administered 2011-08-25: 4 mg via INTRAVENOUS
  Filled 2011-08-25: qty 2

## 2011-08-25 MED ORDER — SODIUM CHLORIDE 0.9 % IV SOLN: 1000.0000 mL | INTRAVENOUS | Status: DC

## 2011-08-25 MED ORDER — HYDROCODONE-ACETAMINOPHEN 5-325 MG PO TABS: 1.0000 | ORAL_TABLET | Freq: Four times a day (QID) | ORAL | Status: AC | PRN

## 2011-08-25 MED ORDER — FENTANYL CITRATE 0.05 MG/ML IJ SOLN
100.0000 ug | Freq: Once | INTRAMUSCULAR | Status: AC
  Administered 2011-08-25: 100 ug via INTRAVENOUS
  Filled 2011-08-25: qty 2

## 2011-08-25 MED ORDER — DOXYCYCLINE HYCLATE 100 MG PO TABS: 100.0000 mg | ORAL_TABLET | Freq: Two times a day (BID) | ORAL | Status: AC

## 2011-08-25 MED ORDER — LIDOCAINE HCL (PF) 1 % IJ SOLN
INTRAMUSCULAR | Status: AC
  Administered 2011-08-25: 5 mL
  Filled 2011-08-25: qty 5

## 2011-08-25 MED ORDER — SODIUM CHLORIDE 0.9 % IV SOLN
1000.0000 mL | Freq: Once | INTRAVENOUS | Status: AC
  Administered 2011-08-25: 1000 mL via INTRAVENOUS

## 2011-08-25 MED ORDER — NAPROXEN 500 MG PO TABS: 500.0000 mg | ORAL_TABLET | Freq: Two times a day (BID) | ORAL | Status: DC

## 2011-08-25 MED ORDER — CEFTRIAXONE SODIUM 250 MG IJ SOLR
250.0000 mg | Freq: Once | INTRAMUSCULAR | Status: AC
  Administered 2011-08-25: 250 mg via INTRAMUSCULAR
  Filled 2011-08-25: qty 250

## 2011-08-25 NOTE — ED Notes (Signed)
Pt reports left sided abdominal pain, that resolved. Pt went to sleep then awoke from sleep with severe left lower abdominal pain

## 2011-08-25 NOTE — ED Provider Notes (Signed)
History     CSN: [id number]  Arrival date & time 08/25/11  0134   First MD Initiated Contact with Patient 08/25/11 0150      Chief Complaint  Patient presents with  . Abdominal Pain    (Consider location/radiation/quality/duration/timing/severity/associated sxs/prior treatment) Patient is a 31 y.o. female presenting with abdominal pain. The history is provided by the patient.  Abdominal Pain The primary symptoms of the illness include abdominal pain. The primary symptoms of the illness do not include nausea, vomiting, diarrhea, dysuria, vaginal discharge or vaginal bleeding. The onset of the illness was sudden.  The patient states that she believes she is currently not pregnant. The patient has not had a change in bowel habit. Symptoms associated with the illness do not include urgency, frequency or back pain.  Pt has had trouble with ovarian cysts as well as "scar tissue".  Past Medical History  Diagnosis Date  . Migraine   . Depression   . Bipolar 1 disorder     Past Surgical History  Procedure Date  . Tonsillectomy   . Cholecystectomy   . Exploratory laparotomy   . Tubal ligation   . Exploratory laparotomy     History reviewed. No pertinent family history.  History  Substance Use Topics  . Smoking status: Current Everyday Smoker -- 1.0 packs/day  . Smokeless tobacco: Not on file  . Alcohol Use: No    OB History    Grav Para Term Preterm Abortions TAB SAB Ect Mult Living                  Review of Systems  Gastrointestinal: Positive for abdominal pain. Negative for nausea, vomiting and diarrhea.  Genitourinary: Negative for dysuria, urgency, frequency, vaginal bleeding and vaginal discharge.  Musculoskeletal: Negative for back pain.  All other systems reviewed and are negative.    Allergies  Morphine and related  Home Medications   Current Outpatient Rx  Name Route Sig Dispense Refill  . ARIPIPRAZOLE 5 MG PO TABS Oral Take 5 mg by mouth daily.        Marlin Canary HEADACHE PO Oral Take 1 packet by mouth once.      . ASPIRIN EFFERVESCENT 325 MG PO TBEF Oral Take 325 mg by mouth every 6 (six) hours as needed. Patient took this medication for flu symptoms.    . DESVENLAFAXINE SUCCINATE ER 50 MG PO TB24 Oral Take 50 mg by mouth daily.      Marland Kitchen LAMOTRIGINE 25 MG PO TABS Oral Take 25 mg by mouth daily.    . QUETIAPINE FUMARATE 100 MG PO TABS Oral Take 150 mg by mouth at bedtime.       BP 128/78  Pulse 89  Temp 98.6 F (37 C) (Oral)  Resp 18  SpO2 100%  LMP 08/04/2011  Physical Exam  Nursing note and vitals reviewed. Constitutional: She appears well-developed and well-nourished. No distress.  HENT:  Head: Normocephalic and atraumatic.  Right Ear: External ear normal.  Left Ear: External ear normal.  Eyes: Conjunctivae are normal. Right eye exhibits no discharge. Left eye exhibits no discharge. No scleral icterus.  Neck: Neck supple. No tracheal deviation present.  Cardiovascular: Normal rate, regular rhythm and intact distal pulses.   Pulmonary/Chest: Effort normal and breath sounds normal. No stridor. No respiratory distress. She has no wheezes. She has no rales.  Abdominal: Soft. Bowel sounds are normal. She exhibits no distension. There is tenderness in the left lower quadrant. There is no rigidity, no rebound,  no guarding and no CVA tenderness. No hernia.  Genitourinary: Uterus normal. There is no rash on the right labia. There is no rash on the left labia. Cervix exhibits discharge. Cervix exhibits no motion tenderness. Right adnexum displays no mass, no tenderness and no fullness. Left adnexum displays no mass, no tenderness and no fullness. No erythema or tenderness around the vagina. No signs of injury around the vagina. Vaginal discharge found.  Musculoskeletal: She exhibits no edema and no tenderness.  Neurological: She is alert. She has normal strength. No sensory deficit. Cranial nerve deficit:  no gross defecits noted. She exhibits  normal muscle tone. She displays no seizure activity. Coordination normal.  Skin: Skin is warm and dry. No rash noted.  Psychiatric: She has a normal mood and affect.    ED Course  Procedures (including critical care time)  Medications  0.9 %  sodium chloride infusion (1000 mL Intravenous New Bag/Given 08/25/11 0208)    Followed by  0.9 %  sodium chloride infusion (not administered)  HYDROcodone-acetaminophen (NORCO) 5-325 MG per tablet (not administered)  naproxen (NAPROSYN) 500 MG tablet (not administered)  doxycycline (VIBRA-TABS) 100 MG tablet (not administered)  ondansetron (ZOFRAN) injection 4 mg (4 mg Intravenous Given 08/25/11 0208)  fentaNYL (SUBLIMAZE) injection 100 mcg (100 mcg Intravenous Given 08/25/11 0208)  cefTRIAXone (ROCEPHIN) injection 250 mg (250 mg Intramuscular Given 08/25/11 0313)  lidocaine (XYLOCAINE) 1 % injection (5 mL  Given 08/25/11 0313)    Labs Reviewed  CBC - Abnormal; Notable for the following:    WBC 14.9 (*)     MCHC 36.1 (*)     All other components within normal limits  DIFFERENTIAL - Abnormal; Notable for the following:    Neutro Abs 8.5 (*)     Lymphs Abs 4.2 (*)     Monocytes Absolute 1.1 (*)     Eosinophils Relative 6 (*)     Eosinophils Absolute 0.9 (*)     All other components within normal limits  URINALYSIS, ROUTINE W REFLEX MICROSCOPIC - Abnormal; Notable for the following:    APPearance CLOUDY (*)     Leukocytes, UA MODERATE (*)     All other components within normal limits  BASIC METABOLIC PANEL - Abnormal; Notable for the following:    Potassium 3.3 (*)     Glucose, Bld 118 (*)     GFR calc non Af Amer 74 (*)     GFR calc Af Amer 86 (*)     All other components within normal limits  WET PREP, GENITAL - Abnormal; Notable for the following:    Yeast Wet Prep HPF POC FEW (*)     Clue Cells Wet Prep HPF POC TOO NUMEROUS TO COUNT (*)     WBC, Wet Prep HPF POC TOO NUMEROUS TO COUNT (*)  BACTERIA- TOO NUMEROUS TO COUNT   All other  components within normal limits  URINE MICROSCOPIC-ADD ON - Abnormal; Notable for the following:    Squamous Epithelial / LPF MANY (*)     Bacteria, UA MANY (*)     All other components within normal limits  PREGNANCY, URINE  GC/CHLAMYDIA PROBE AMP, GENITAL  RPR   No results found.   1. Cervicitis       MDM  Suspect her symptoms are related to cervicitis.  Doubt diverticulitis.  Not consistent with TOA, torsion.  Findings discussed with patient as well as need for follow up.        Celene Kras, MD 08/25/11  0315 

## 2011-08-25 NOTE — Discharge Instructions (Signed)
Cervicitis   Cervicitis is a soreness and swelling (inflammation) of the cervix. Your cervix is located at the bottom of your uterus which opens up to the vagina.   CAUSES   Sexually transmitted infections (STIs).   Allergic reaction.   Medicines or birth control devices that are put in the vagina.   Injury to the cervix.   Bacterial infections.   SYMPTOMS   There may be no symptoms. If symptoms occur, they may include:   Grey, white, yellow, or bad smelling vaginal discharge.   Pain or itching of the area outside the vagina.   Painful sexual intercourse.   Lower abdominal or lower back pain, especially during intercourse.   Frequent urination.   Abnormal vaginal bleeding between periods, after sexual intercourse, or after menopause.   Pressure or a heavy feeling in the pelvis.   DIAGNOSIS   Diagnosis is made after a pelvic exam. Other tests may include:   Examination of any discharge under a microscope (wet prep).   A Pap test.   TREATMENT   Treatment will depend on the cause of cervicitis. If it is caused by an STI, both you and your partner will need to be treated. Antibiotic medicines will be given.   HOME CARE INSTRUCTIONS   Do not have sexual intercourse until your caregiver says it is okay.   Do not have sexual intercourse until your partner has been treated if your cervicitis is caused by an STI.   Take your antibiotics as directed. Finish them even if you start to feel better.   SEEK IMMEDIATE MEDICAL CARE IF:   Your symptoms come back.   You have a fever.   You experience any problems that may be related to the medicine you are taking.   MAKE SURE YOU:   Understand these instructions.   Will watch your condition.   Will get help right away if you are not doing well or get worse.   Document Released: 02/20/2005 Document Revised: 02/09/2011 Document Reviewed: 09/19/2010   [employer] Patient Information 2012 [employer].

## 2011-09-13 ENCOUNTER — Emergency Department (HOSPITAL_BASED_OUTPATIENT_CLINIC_OR_DEPARTMENT_OTHER)
Admission: EM | Admit: 2011-09-13 | Discharge: 2011-09-14 | Disposition: A | Discharge status: Home / Self Care | Payer: Self-pay | Attending: Emergency Medicine | Admitting: Emergency Medicine

## 2011-09-13 ENCOUNTER — Encounter (HOSPITAL_BASED_OUTPATIENT_CLINIC_OR_DEPARTMENT_OTHER): Payer: Self-pay | Admitting: *Deleted

## 2011-09-13 DIAGNOSIS — F3130 Bipolar disorder, current episode depressed, mild or moderate severity, unspecified: Secondary | ICD-10-CM | POA: Insufficient documentation

## 2011-09-13 DIAGNOSIS — F17200 Nicotine dependence, unspecified, uncomplicated: Secondary | ICD-10-CM | POA: Insufficient documentation

## 2011-09-13 DIAGNOSIS — G43909 Migraine, unspecified, not intractable, without status migrainosus: Secondary | ICD-10-CM | POA: Insufficient documentation

## 2011-09-13 MED ORDER — KETOROLAC TROMETHAMINE 30 MG/ML IJ SOLN
30.0000 mg | Freq: Once | INTRAMUSCULAR | Status: AC
  Administered 2011-09-13: 30 mg via INTRAVENOUS
  Filled 2011-09-13: qty 1

## 2011-09-13 MED ORDER — ONDANSETRON HCL 4 MG/2ML IJ SOLN
4.0000 mg | Freq: Once | INTRAMUSCULAR | Status: AC
  Administered 2011-09-13: 4 mg via INTRAVENOUS
  Filled 2011-09-13: qty 2

## 2011-09-13 MED ORDER — SODIUM CHLORIDE 0.9 % IV BOLUS (SEPSIS)
1000.0000 mL | Freq: Once | INTRAVENOUS | Status: AC
  Administered 2011-09-13: 1000 mL via INTRAVENOUS

## 2011-09-13 NOTE — ED Provider Notes (Signed)
History     CSN: [id number]  Arrival date & time 09/13/11  2122   First MD Initiated Contact with Patient 09/13/11 2322      Chief Complaint  Patient presents with  . Migraine    (Consider location/radiation/quality/duration/timing/severity/associated sxs/prior treatment) HPI This is a 31 year old white female with a history of a migraine headache since yesterday. The headache is described as throbbing or pulsating pain initiating behind her right ear but radiating to the entire head. She rates it about a 7/10. There's been associated nausea but no vomiting. She has had photophobia. Consistent with prior migraines. She has no focal neurologic deficit associated with it. She has not taken anything for it but requests IV Toradol.  Past Medical History  Diagnosis Date  . Migraine   . Depression   . Bipolar 1 disorder     Past Surgical History  Procedure Date  . Tonsillectomy   . Cholecystectomy   . Exploratory laparotomy   . Tubal ligation   . Exploratory laparotomy     History reviewed. No pertinent family history.  History  Substance Use Topics  . Smoking status: Current Everyday Smoker -- 1.0 packs/day  . Smokeless tobacco: Not on file  . Alcohol Use: No    OB History    Grav Para Term Preterm Abortions TAB SAB Ect Mult Living                  Review of Systems  All other systems reviewed and are negative.    Allergies  Morphine and related  Home Medications   Current Outpatient Rx  Name Route Sig Dispense Refill  . ARIPIPRAZOLE 5 MG PO TABS Oral Take 5 mg by mouth daily.     Marlin Canary HEADACHE PO Oral Take 1 packet by mouth once. Patient used this medication for pain.    Marland Kitchen CETIRIZINE HCL 10 MG PO TABS Oral Take 10 mg by mouth daily. Patient uses this medication for her allergies.    . TOPIRAMATE 25 MG PO TABS Oral Take 25 mg by mouth 2 (two) times daily.      Pulse 72  Temp 98.1 F (36.7 C) (Oral)  Resp 16  Ht 5\' 1"  (1.549 m)  Wt 180 lb (81.647  kg)  BMI 34.01 kg/m2  SpO2 99%  LMP 08/30/2011  Physical Exam General: Well-developed, well-nourished female in no acute distress; appearance consistent with age of record HENT: normocephalic, atraumatic Eyes: pupils equal round and reactive to light; extraocular muscles intact Neck: supple Heart: regular rate and rhythm Lungs: clear to auscultation bilaterally Abdomen: soft; nondistended Extremities: No deformity; full range of motion; pulses normal Neurologic: Awake, alert and oriented; motor function intact in all extremities and symmetric; no facial droop Skin: Warm and dry Psychiatric: Flat affect    ED Course  Procedures (including critical care time)    MDM  12:51 AM Pain now a 1/10 after IV fluid bolus and IV Toradol.        Hanley Seamen, MD 09/14/11 [phone]

## 2011-09-13 NOTE — ED Notes (Signed)
Pt c/o migraine x 2 days.  ?

## 2011-09-13 NOTE — ED Notes (Signed)
MD at bedside. 

## 2011-09-14 NOTE — ED Notes (Signed)
MD at bedside. 

## 2011-10-15 ENCOUNTER — Encounter (HOSPITAL_BASED_OUTPATIENT_CLINIC_OR_DEPARTMENT_OTHER): Payer: Self-pay | Admitting: *Deleted

## 2011-10-15 ENCOUNTER — Emergency Department (HOSPITAL_BASED_OUTPATIENT_CLINIC_OR_DEPARTMENT_OTHER)
Admission: EM | Admit: 2011-10-15 | Discharge: 2011-10-15 | Disposition: A | Discharge status: Home / Self Care | Payer: Self-pay | Attending: Emergency Medicine | Admitting: Emergency Medicine

## 2011-10-15 DIAGNOSIS — K0889 Other specified disorders of teeth and supporting structures: Secondary | ICD-10-CM

## 2011-10-15 DIAGNOSIS — K089 Disorder of teeth and supporting structures, unspecified: Secondary | ICD-10-CM | POA: Insufficient documentation

## 2011-10-15 DIAGNOSIS — F319 Bipolar disorder, unspecified: Secondary | ICD-10-CM | POA: Insufficient documentation

## 2011-10-15 DIAGNOSIS — K029 Dental caries, unspecified: Secondary | ICD-10-CM

## 2011-10-15 DIAGNOSIS — F17200 Nicotine dependence, unspecified, uncomplicated: Secondary | ICD-10-CM | POA: Insufficient documentation

## 2011-10-15 MED ORDER — NAPROXEN 250 MG PO TABS
500.0000 mg | ORAL_TABLET | Freq: Once | ORAL | Status: AC
  Administered 2011-10-15: 500 mg via ORAL
  Filled 2011-10-15: qty 2

## 2011-10-15 MED ORDER — HYDROCODONE-ACETAMINOPHEN 5-325 MG PO TABS
1.0000 | ORAL_TABLET | Freq: Once | ORAL | Status: AC
  Administered 2011-10-15: 1 via ORAL
  Filled 2011-10-15: qty 1

## 2011-10-15 MED ORDER — HYDROCODONE-ACETAMINOPHEN 5-325 MG PO TABS: 1.0000 | ORAL_TABLET | Freq: Four times a day (QID) | ORAL | Status: AC | PRN

## 2011-10-15 MED ORDER — PENICILLIN V POTASSIUM 500 MG PO TABS: 500.0000 mg | ORAL_TABLET | Freq: Four times a day (QID) | ORAL | Status: AC

## 2011-10-15 NOTE — ED Notes (Signed)
Pt woke an hour ago with tooth pain and gum swelling on right upper side denies fevers or N/V

## 2011-10-15 NOTE — ED Provider Notes (Signed)
History     CSN: [id number]  Arrival date & time 10/15/11  0143   First MD Initiated Contact with Patient 10/15/11 0210      Chief Complaint  Patient presents with  . Dental Pain    (Consider location/radiation/quality/duration/timing/severity/associated sxs/prior treatment) HPI This is a 31 year old white female who woke up about an hour ago with severe toothache. Toothache is located in the right upper jaw. It is worse with palpation or percussion of the tooth adjacent jaw. There is no associated lymphadenopathy. She denies fever or chills. There's been no nausea or vomiting. She has a Education officer, community but cannot afford to see him. She has not taken anything for this pain.  Past Medical History  Diagnosis Date  . Migraine   . Depression   . Bipolar 1 disorder     Past Surgical History  Procedure Date  . Tonsillectomy   . Cholecystectomy   . Exploratory laparotomy   . Tubal ligation   . Exploratory laparotomy     History reviewed. No pertinent family history.  History  Substance Use Topics  . Smoking status: Current Everyday Smoker -- 1.0 packs/day  . Smokeless tobacco: Not on file  . Alcohol Use: No    OB History    Grav Para Term Preterm Abortions TAB SAB Ect Mult Living                  Review of Systems  All other systems reviewed and are negative.    Allergies  Morphine and related  Home Medications   Current Outpatient Rx  Name Route Sig Dispense Refill  . DESVENLAFAXINE SUCCINATE ER 50 MG PO TB24 Oral Take 50 mg by mouth daily.    . ARIPIPRAZOLE 5 MG PO TABS Oral Take 5 mg by mouth daily.     Marlin Canary HEADACHE PO Oral Take 1 packet by mouth once. Patient used this medication for pain.    Marland Kitchen CETIRIZINE HCL 10 MG PO TABS Oral Take 10 mg by mouth daily. Patient uses this medication for her allergies.    . TOPIRAMATE 25 MG PO TABS Oral Take 25 mg by mouth 2 (two) times daily.      BP 119/75  Pulse 80  Temp 97.7 F (36.5 C) (Oral)  Resp 18  SpO2 100%   LMP 10/01/2011  Physical Exam General: Well-developed, well-nourished female in no acute distress; appearance consistent with age of record HENT: normocephalic, atraumatic; multiple missing teeth and widespread delta K. notably of the right upper teeth; tenderness to percussion of right upper jaw Eyes: pupils equal round and reactive to light; extraocular muscles intact Neck: supple; no Heart: regular rate and rhythm Lungs: Normal respiratory effort and excursion Abdomen: soft; nondistended Extremities: No deformity; full range of motion Neurologic: Awake, alert and oriented; motor function intact in all extremities and symmetric; no facial droop Skin: Warm and dry     ED Course  Procedures (including critical care time)     MDM          Hanley Seamen, MD 10/15/11 4098

## 2011-11-14 ENCOUNTER — Encounter (HOSPITAL_BASED_OUTPATIENT_CLINIC_OR_DEPARTMENT_OTHER): Payer: Self-pay | Admitting: Emergency Medicine

## 2011-11-14 ENCOUNTER — Emergency Department (HOSPITAL_BASED_OUTPATIENT_CLINIC_OR_DEPARTMENT_OTHER)
Admission: EM | Admit: 2011-11-14 | Discharge: 2011-11-14 | Disposition: A | Discharge status: Home / Self Care | Payer: Self-pay | Attending: Emergency Medicine | Admitting: Emergency Medicine

## 2011-11-14 DIAGNOSIS — Z885 Allergy status to narcotic agent status: Secondary | ICD-10-CM | POA: Insufficient documentation

## 2011-11-14 DIAGNOSIS — H9209 Otalgia, unspecified ear: Secondary | ICD-10-CM

## 2011-11-14 DIAGNOSIS — F319 Bipolar disorder, unspecified: Secondary | ICD-10-CM | POA: Insufficient documentation

## 2011-11-14 DIAGNOSIS — F17200 Nicotine dependence, unspecified, uncomplicated: Secondary | ICD-10-CM | POA: Insufficient documentation

## 2011-11-14 DIAGNOSIS — K130 Diseases of lips: Secondary | ICD-10-CM

## 2011-11-14 HISTORY — DX: Streptococcal pharyngitis: J02.0

## 2011-11-14 HISTORY — DX: Otitis media, unspecified, unspecified ear: H66.90

## 2011-11-14 MED ORDER — ANTIPYRINE-BENZOCAINE 5.4-1.4 % OT SOLN
3.0000 [drp] | Freq: Once | OTIC | Status: AC
  Administered 2011-11-14: 4 [drp] via OTIC
  Filled 2011-11-14: qty 10

## 2011-11-14 MED ORDER — AMOXICILLIN 500 MG PO CAPS: 500.0000 mg | ORAL_CAPSULE | Freq: Three times a day (TID) | ORAL | Status: AC

## 2011-11-14 NOTE — ED Provider Notes (Addendum)
History     CSN: [id number]  Arrival date & time 11/14/11  0241   First MD Initiated Contact with Patient 11/14/11 0335      Chief Complaint  Patient presents with  . Otalgia    (Consider location/radiation/quality/duration/timing/severity/associated sxs/prior treatment) HPI Comments: Swallowing makes ear hurt worse.  No recent swimming.  Patient is a 31 y.o. female presenting with ear pain. The history is provided by the patient.  Otalgia The current episode started yesterday. There is pain in the right ear. The problem occurs constantly. The problem has been gradually worsening. The pain is moderate. Pertinent negatives include no ear discharge, no headaches, no sore throat, no vomiting, no neck pain, no cough and no rash. Her past medical history is significant for tympanostomy tube.    Past Medical History  Diagnosis Date  . Migraine   . Depression   . Bipolar 1 disorder   . Otitis media   . Strep throat     Past Surgical History  Procedure Date  . Tonsillectomy   . Cholecystectomy   . Exploratory laparotomy   . Tubal ligation   . Exploratory laparotomy   . Tympanostomy     History reviewed. No pertinent family history.  History  Substance Use Topics  . Smoking status: Current Everyday Smoker -- 1.0 packs/day  . Smokeless tobacco: Not on file  . Alcohol Use: No    OB History    Grav Para Term Preterm Abortions TAB SAB Ect Mult Living                  Review of Systems  Constitutional: Positive for fever. Negative for chills.  HENT: Positive for ear pain. Negative for sore throat, trouble swallowing, neck pain and ear discharge.   Respiratory: Negative for cough.   Gastrointestinal: Negative for nausea and vomiting.  Skin: Negative for rash.  Neurological: Negative for headaches.    Allergies  Morphine and related  Home Medications   Current Outpatient Rx  Name Route Sig Dispense Refill  . AMOXICILLIN 500 MG PO CAPS Oral Take 1 capsule (500  mg total) by mouth 3 (three) times daily. 21 capsule 0  . ARIPIPRAZOLE 5 MG PO TABS Oral Take 5 mg by mouth daily.     Marlin Canary HEADACHE PO Oral Take 1 packet by mouth once. Patient used this medication for pain.    Marland Kitchen CETIRIZINE HCL 10 MG PO TABS Oral Take 10 mg by mouth daily. Patient uses this medication for her allergies.    . DESVENLAFAXINE SUCCINATE ER 50 MG PO TB24 Oral Take 50 mg by mouth daily.    . TOPIRAMATE 25 MG PO TABS Oral Take 25 mg by mouth 2 (two) times daily.      BP 107/75  Pulse 72  Temp 97.6 F (36.4 C) (Oral)  Resp 18  Ht 5\' 1"  (1.549 m)  Wt 200 lb (90.719 kg)  BMI 37.79 kg/m2  SpO2 99%  Physical Exam  Nursing note and vitals reviewed. Constitutional: She appears well-developed and well-nourished. No distress.  HENT:  Head: Normocephalic and atraumatic.  Right Ear: Hearing and tympanic membrane normal. No drainage. No mastoid tenderness. No middle ear effusion.  Left Ear: Hearing and tympanic membrane normal. No drainage. No mastoid tenderness.  No middle ear effusion.  Mouth/Throat:    Eyes: Pupils are equal, round, and reactive to light. Right eye exhibits no discharge.  Neck: Normal range of motion.  Lymphadenopathy:    She has no cervical  adenopathy.  Skin: She is not diaphoretic.    ED Course  Procedures (including critical care time)  Labs Reviewed - No data to display No results found.   1. Pain in ear   2. Angular cheilitis       MDM  Auralgan, will give amox.  No fever, no mastoid tenderness.        Gavin Pound. Oletta Lamas, MD 11/14/11 1610  Gavin Pound. Oletta Lamas, MD 11/14/11 [phone]

## 2011-11-14 NOTE — ED Notes (Signed)
MD at bedside. 

## 2011-11-14 NOTE — Discharge Instructions (Signed)
 Eardrops You have been diagnosed with a condition requiring you to put drops of medication into your outer ear. HOME CARE INSTRUCTIONS   Put drops in the affected ear as instructed. After putting the drops in, you will need to lay down with the affected ear facing up for ten minutes so the drops will remain in the ear canal and run down and fill the canal. Continue using eardrops for as long as directed by your caregiver.   Prior to getting up, put a cotton ball gently in your ear canal. Leave this plug out far enough so it can be easily removed. Do not attempt to push this down into the canal with a Q-tip or other instrument.   Do not irrigate or wash out your ears if you have had a perforated eardrum or mastoid surgery, or unless instructed to do so by your caregiver.   Keep appointments with your caregiver as instructed.   Finish all medications, or use for the length of time as instructed. Continue the drops even if your problem seems to be doing well after a couple days, or continue as instructed.  Return to see your caregiver if you are unsuccessful with the above instructions for home care.  SEEK MEDICAL CARE IF:  You become worse or develop increasing pain.   You notice any unusual drainage from your ear.   You develop hearing difficulties.   You have any other questions or concerns.  MAKE SURE YOU:   Understand these instructions.   Will watch your condition.   Will get help right away if you are not doing well or get worse.  Document Released: 02/14/2001 Document Revised: 02/09/2011 Document Reviewed: 09/08/2008 [facility] Patient Information 2012 [place], [place].    Otalgia The most common reason for this in children is an infection of the middle ear. Pain from the middle ear is usually caused by a build-up of fluid and pressure behind the eardrum. Pain from an earache can be sharp, dull, or burning. The pain may be temporary or constant. The middle ear is connected to  the nasal passages by a short narrow tube called the Eustachian tube. The Eustachian tube allows fluid to drain out of the middle ear, and helps keep the pressure in your ear equalized. CAUSES  A cold or allergy can block the Eustachian tube with inflammation and the build-up of secretions. This is especially likely in small children, because their Eustachian tube is shorter and more horizontal. When the Eustachian tube closes, the normal flow of fluid from the middle ear is stopped. Fluid can accumulate and cause stuffiness, pain, hearing loss, and an ear infection if germs start growing in this area. SYMPTOMS  The symptoms of an ear infection may include fever, ear pain, fussiness, increased crying, and irritability. Many children will have temporary and minor hearing loss during and right after an ear infection. Permanent hearing loss is rare, but the risk increases the more infections a child has. Other causes of ear pain include retained water in the outer ear canal from swimming and bathing. Ear pain in adults is less likely to be from an ear infection. Ear pain may be referred from other locations. Referred pain may be from the joint between your jaw and the skull. It may also come from a tooth problem or problems in the neck. Other causes of ear pain include:  A foreign body in the ear.   Outer ear infection.   Sinus infections.   Impacted ear wax.  Ear injury.   Arthritis of the jaw or TMJ problems.   Middle ear infection.   Tooth infections.   Sore throat with pain to the ears.  DIAGNOSIS  Your caregiver can usually make the diagnosis by examining you. Sometimes other special studies, including x-rays and lab work may be necessary. TREATMENT   If antibiotics were prescribed, use them as directed and finish them even if you or your child's symptoms seem to be improved.   Sometimes PE tubes are needed in children. These are little plastic tubes which are put into the eardrum  during a simple surgical procedure. They allow fluid to drain easier and allow the pressure in the middle ear to equalize. This helps relieve the ear pain caused by pressure changes.  HOME CARE INSTRUCTIONS   Only take over-the-counter or prescription medicines for pain, discomfort, or fever as directed by your caregiver. DO NOT GIVE CHILDREN ASPIRIN because of the association of Reye's Syndrome in children taking aspirin.   Use a cold pack applied to the outer ear for 15 to 20 minutes, 3 to 4 times per day or as needed may reduce pain. Do not apply ice directly to the skin. You may cause frost bite.   Over-the-counter ear drops used as directed may be effective. Your caregiver may sometimes prescribe ear drops.   Resting in an upright position may help reduce pressure in the middle ear and relieve pain.   Ear pain caused by rapidly descending from high altitudes can be relieved by swallowing or chewing gum. Allowing infants to suck on a bottle during airplane travel can help.   Do not smoke in the house or near children. If you are unable to quit smoking, smoke outside.   Control allergies.  SEEK IMMEDIATE MEDICAL CARE IF:   You or your child are becoming sicker.   Pain or fever relief is not obtained with medicine.   You or your child's symptoms (pain, fever, or irritability) do not improve within 24 to 48 hours or as instructed.   Severe pain suddenly stops hurting. This may indicate a ruptured eardrum.   You or your children develop new problems such as severe headaches, stiff neck, difficulty swallowing, or swelling of the face or around the ear.  Document Released: 10/08/2003 Document Revised: 02/09/2011 Document Reviewed: 02/12/2008 [facility] Patient Information 2012 [place], [place].     Try Lamisil cream from pharmacy on corners of lips.

## 2011-11-14 NOTE — ED Notes (Signed)
Pt c/o right sided ear pain

## 2011-11-20 ENCOUNTER — Emergency Department (HOSPITAL_BASED_OUTPATIENT_CLINIC_OR_DEPARTMENT_OTHER)
Admission: EM | Admit: 2011-11-20 | Discharge: 2011-11-20 | Disposition: A | Discharge status: Home / Self Care | Payer: Self-pay | Attending: Emergency Medicine | Admitting: Emergency Medicine

## 2011-11-20 ENCOUNTER — Encounter (HOSPITAL_BASED_OUTPATIENT_CLINIC_OR_DEPARTMENT_OTHER): Payer: Self-pay | Admitting: *Deleted

## 2011-11-20 DIAGNOSIS — Z885 Allergy status to narcotic agent status: Secondary | ICD-10-CM | POA: Insufficient documentation

## 2011-11-20 DIAGNOSIS — F319 Bipolar disorder, unspecified: Secondary | ICD-10-CM | POA: Insufficient documentation

## 2011-11-20 DIAGNOSIS — F17200 Nicotine dependence, unspecified, uncomplicated: Secondary | ICD-10-CM | POA: Insufficient documentation

## 2011-11-20 DIAGNOSIS — J069 Acute upper respiratory infection, unspecified: Secondary | ICD-10-CM | POA: Insufficient documentation

## 2011-11-20 NOTE — ED Notes (Signed)
Pt amb to room 6 with quick steady gait in nad. Pt reports cough, congestion since Friday.

## 2011-11-20 NOTE — ED Provider Notes (Signed)
History     CSN: [id number]  Arrival date & time 11/20/11  0907   First MD Initiated Contact with Patient 11/20/11 [phone]      Chief Complaint  Patient presents with  . Cough  . Nasal Congestion    (Consider location/radiation/quality/duration/timing/severity/associated sxs/prior treatment) Patient is a 31 y.o. female presenting with cough. The history is provided by the patient.  Cough This is a new problem. Episode onset: 3 days ago. The problem occurs constantly. The problem has not changed since onset.The cough is non-productive. The maximum temperature recorded prior to her arrival was 100 to 100.9 F. Associated symptoms include ear congestion and rhinorrhea. Pertinent negatives include no chills. She has tried decongestants and cough syrup for the symptoms. The treatment provided mild relief. She is a smoker.    Past Medical History  Diagnosis Date  . Migraine   . Depression   . Bipolar 1 disorder   . Otitis media   . Strep throat     Past Surgical History  Procedure Date  . Tonsillectomy   . Cholecystectomy   . Exploratory laparotomy   . Tubal ligation   . Exploratory laparotomy   . Tympanostomy     History reviewed. No pertinent family history.  History  Substance Use Topics  . Smoking status: Current Every Day Smoker -- 1.0 packs/day  . Smokeless tobacco: Not on file  . Alcohol Use: No    OB History    Grav Para Term Preterm Abortions TAB SAB Ect Mult Living                  Review of Systems  Constitutional: Negative for chills.  HENT: Positive for rhinorrhea.   Respiratory: Positive for cough.   All other systems reviewed and are negative.    Allergies  Morphine and related  Home Medications   Current Outpatient Rx  Name Route Sig Dispense Refill  . AMOXICILLIN 500 MG PO CAPS Oral Take 1 capsule (500 mg total) by mouth 3 (three) times daily. 21 capsule 0  . ARIPIPRAZOLE 5 MG PO TABS Oral Take 5 mg by mouth daily.     Marlin Canary HEADACHE PO  Oral Take 1 packet by mouth once. Patient used this medication for pain.    Marland Kitchen CETIRIZINE HCL 10 MG PO TABS Oral Take 10 mg by mouth daily. Patient uses this medication for her allergies.    . DESVENLAFAXINE SUCCINATE ER 50 MG PO TB24 Oral Take 50 mg by mouth daily.    . TOPIRAMATE 25 MG PO TABS Oral Take 25 mg by mouth 2 (two) times daily.      BP 127/67  Pulse 90  Temp 98.6 F (37 C)  Resp 20  LMP 10/30/2011  Physical Exam  Nursing note and vitals reviewed. Constitutional: She is oriented to person, place, and time. She appears well-developed and well-nourished. No distress.  HENT:  Head: Normocephalic and atraumatic.  Mouth/Throat: Oropharynx is clear and moist. No oropharyngeal exudate.       Bilateral TM's clear  Neck: Normal range of motion. Neck supple.  Cardiovascular: Normal rate and regular rhythm.  Exam reveals no gallop and no friction rub.   No murmur heard. Pulmonary/Chest: Effort normal and breath sounds normal. No respiratory distress. She has no wheezes.  Abdominal: Soft. Bowel sounds are normal. She exhibits no distension. There is no tenderness.  Musculoskeletal: Normal range of motion.  Neurological: She is alert and oriented to person, place, and time.  Skin: Skin  is warm and dry. She is not diaphoretic.    ED Course  Procedures (including critical care time)  Labs Reviewed - No data to display No results found.   No diagnosis found.    MDM  Symptoms most likely viral in nature.  Patient advised to take otc meds, rest, and give time.  Follow up with pcp if not improving in the next week.        Geoffery Lyons, MD 11/20/11 1010

## 2011-12-02 ENCOUNTER — Emergency Department (HOSPITAL_BASED_OUTPATIENT_CLINIC_OR_DEPARTMENT_OTHER)
Admission: EM | Admit: 2011-12-02 | Discharge: 2011-12-02 | Disposition: A | Discharge status: Home / Self Care | Payer: Self-pay | Attending: Emergency Medicine | Admitting: Emergency Medicine

## 2011-12-02 ENCOUNTER — Encounter (HOSPITAL_BASED_OUTPATIENT_CLINIC_OR_DEPARTMENT_OTHER): Payer: Self-pay | Admitting: *Deleted

## 2011-12-02 DIAGNOSIS — G43909 Migraine, unspecified, not intractable, without status migrainosus: Secondary | ICD-10-CM | POA: Insufficient documentation

## 2011-12-02 DIAGNOSIS — F319 Bipolar disorder, unspecified: Secondary | ICD-10-CM | POA: Insufficient documentation

## 2011-12-02 DIAGNOSIS — F17200 Nicotine dependence, unspecified, uncomplicated: Secondary | ICD-10-CM | POA: Insufficient documentation

## 2011-12-02 MED ORDER — DIPHENHYDRAMINE HCL 50 MG/ML IJ SOLN
25.0000 mg | Freq: Once | INTRAMUSCULAR | Status: AC
  Administered 2011-12-02: 25 mg via INTRAVENOUS
  Filled 2011-12-02: qty 1

## 2011-12-02 MED ORDER — DEXAMETHASONE SODIUM PHOSPHATE 10 MG/ML IJ SOLN
10.0000 mg | Freq: Once | INTRAMUSCULAR | Status: AC
Start: 2011-12-02 — End: 2011-12-02
  Administered 2011-12-02: 10 mg via INTRAVENOUS
  Filled 2011-12-02: qty 1

## 2011-12-02 MED ORDER — SODIUM CHLORIDE 0.9 % IV BOLUS (SEPSIS)
1000.0000 mL | Freq: Once | INTRAVENOUS | Status: AC
  Administered 2011-12-02: 1000 mL via INTRAVENOUS

## 2011-12-02 MED ORDER — METOCLOPRAMIDE HCL 5 MG/ML IJ SOLN
10.0000 mg | Freq: Once | INTRAMUSCULAR | Status: AC
  Administered 2011-12-02: 10 mg via INTRAVENOUS
  Filled 2011-12-02: qty 2

## 2011-12-02 NOTE — ED Notes (Signed)
D/c home with ride- states she feels better, did not want remainder of NS bolus

## 2011-12-02 NOTE — ED Provider Notes (Addendum)
History  This chart was scribed for Tiffany Sprout, MD by Shari Heritage. [name] was seen in room MH02/MH02. Tiffany Whitney's care was started at 1817.     CSN: [id number]  Arrival date & time 12/02/11  1710   First MD Initiated Contact with Tiffany Whitney 12/02/11 1817      Chief Complaint  Tiffany Whitney presents with  . Migraine    Tiffany Whitney is a 31 y.o. female presenting with migraines. The history is provided by [name]. No language interpreter was used.  Migraine This is a recurrent problem. The current episode started 6 to 12 hours ago. The problem occurs constantly. The problem has not changed since onset.Associated symptoms include headaches. Nothing aggravates the symptoms. Nothing relieves the symptoms. She has tried nothing for the symptoms.    Tiffany Whitney is a 31 y.o. female who presents to the Emergency Department complaining of a moderate to severe, left-sided, non-radiating migraine onset 7.5 hours ago. There is associated photophobia and nausea. Tiffany Whitney says that she gets migraines often and that this HA is similar in severity to past migraines, but that they are usually on the right side. Tiffany Whitney denies numbness, weakness or vomiting. Other medical history includes depression and bipolar 1 disorder.  Past Medical History  Diagnosis Date  . Migraine   . Depression   . Bipolar 1 disorder   . Otitis media   . Strep throat     Past Surgical History  Procedure Date  . Tonsillectomy   . Cholecystectomy   . Exploratory laparotomy   . Tubal ligation   . Exploratory laparotomy   . Tympanostomy     No family history on file.  History  Substance Use Topics  . Smoking status: Current Every Day Smoker -- 1.0 packs/day    Types: Cigarettes  . Smokeless tobacco: Never Used  . Alcohol Use: No    OB History    Grav Para Term Preterm Abortions TAB SAB Ect Mult Living                  Review of Systems  Constitutional: Negative.   Eyes: Positive for photophobia.    Respiratory: Negative.   Cardiovascular: Negative.   Gastrointestinal: Positive for nausea. Negative for vomiting.  Genitourinary: Negative.   Musculoskeletal: Negative.   Skin: Negative.   Neurological: Positive for headaches. Negative for weakness and numbness.  Psychiatric/Behavioral: Negative.   All other systems reviewed and are negative.    Allergies  Morphine and related  Home Medications   Current Outpatient Rx  Name Route Sig Dispense Refill  . ARIPIPRAZOLE 5 MG PO TABS Oral Take 5 mg by mouth daily.     Marlin Canary HEADACHE PO Oral Take 1 packet by mouth once. Tiffany Whitney used this medication for pain.    Marland Kitchen CETIRIZINE HCL 10 MG PO TABS Oral Take 10 mg by mouth daily. Tiffany Whitney uses this medication for her allergies.    . DESVENLAFAXINE SUCCINATE ER 50 MG PO TB24 Oral Take 100 mg by mouth daily.     . TOPIRAMATE 25 MG PO TABS Oral Take 50 mg by mouth 2 (two) times daily.       BP 117/65  Pulse 78  Temp 98 F (36.7 C) (Oral)  Resp 18  Ht 5\' 1"  (1.549 m)  Wt 200 lb (90.719 kg)  BMI 37.79 kg/m2  SpO2 100%  LMP 11/28/2011  Physical Exam  Constitutional: She is oriented to person, place, and time. She appears well-developed and well-nourished.  HENT:  Head: Normocephalic and atraumatic.  Right Ear: Hearing and external ear normal.  Left Ear: Hearing and external ear normal.  Eyes: Conjunctivae normal and EOM are normal. Pupils are equal, round, and reactive to light.       Photophobia.  Neck: Normal range of motion. Neck supple. No spinous process tenderness and no muscular tenderness present.  Cardiovascular: Normal rate, normal heart sounds and intact distal pulses.   Pulmonary/Chest: Effort normal. She has no wheezes. She has no rales.  Abdominal: Soft. She exhibits no distension. There is no tenderness. There is no rebound and no guarding.  Lymphadenopathy:    She has no cervical adenopathy.  Neurological: She is alert and oriented to person, place, and time. She  has normal strength. No cranial nerve deficit or sensory deficit. Coordination and gait normal. GCS eye subscore is 4. GCS verbal subscore is 5. GCS motor subscore is 6.       Normal sensation. Normal motor.   Skin: Skin is warm and dry.    ED Course  Procedures (including critical care time) DIAGNOSTIC STUDIES: Oxygen Saturation is 100% on room air, normal by my interpretation.    COORDINATION OF CARE: 6:27pm- Tiffany Whitney informed of current plan for treatment and evaluation and agrees with plan at this time.      Labs Reviewed - No data to display No results found.   No diagnosis found.    MDM   Pt with typical migraine HA without sx suggestive of SAH(sudden onset, worst of life, or deficits), infection, or cavernous vein thrombosis.  Normal neuro exam and vital signs. Will give HA cocktail and on re-eval.  6:59 PM Pt significantly improved and pt is requesting to go home.   I personally performed the services described in this documentation, which was scribed in my presence.  The recorded information has been reviewed and considered.    Tiffany Sprout, MD 12/02/11 1610  Tiffany Sprout, MD 12/02/11 9604  Tiffany Sprout, MD 12/02/11 1859

## 2011-12-02 NOTE — ED Notes (Signed)
Pt reports mha since 1100 with nausea and light senstivity- denies vomiting

## 2011-12-17 ENCOUNTER — Emergency Department (HOSPITAL_BASED_OUTPATIENT_CLINIC_OR_DEPARTMENT_OTHER)
Admission: EM | Admit: 2011-12-17 | Discharge: 2011-12-17 | Disposition: A | Discharge status: Home / Self Care | Payer: Self-pay | Attending: Emergency Medicine | Admitting: Emergency Medicine

## 2011-12-17 ENCOUNTER — Encounter (HOSPITAL_BASED_OUTPATIENT_CLINIC_OR_DEPARTMENT_OTHER): Payer: Self-pay | Admitting: *Deleted

## 2011-12-17 DIAGNOSIS — N72 Inflammatory disease of cervix uteri: Secondary | ICD-10-CM | POA: Insufficient documentation

## 2011-12-17 DIAGNOSIS — F319 Bipolar disorder, unspecified: Secondary | ICD-10-CM | POA: Insufficient documentation

## 2011-12-17 DIAGNOSIS — Z9089 Acquired absence of other organs: Secondary | ICD-10-CM | POA: Insufficient documentation

## 2011-12-17 LAB — WET PREP, GENITAL: Trich, Wet Prep: NONE SEEN

## 2011-12-17 LAB — URINALYSIS, ROUTINE W REFLEX MICROSCOPIC
Glucose, UA: NEGATIVE mg/dL
pH: 6.5 (ref 5.0–8.0)

## 2011-12-17 LAB — BASIC METABOLIC PANEL
BUN: 10 mg/dL (ref 6–23)
Chloride: 107 mEq/L (ref 96–112)
GFR calc Af Amer: 86 mL/min — ABNORMAL LOW (ref >90)
Potassium: 3.6 mEq/L (ref 3.5–5.1)

## 2011-12-17 LAB — CBC WITH DIFFERENTIAL/PLATELET
Basophils Relative: 1 % (ref 0–1)
Hemoglobin: 13.6 g/dL (ref 12.0–15.0)
Lymphs Abs: 2 10*3/uL (ref 0.7–4.0)
MCHC: 34.9 g/dL (ref 30.0–36.0)
Monocytes Relative: 8 % (ref 3–12)
Neutro Abs: 7.4 10*3/uL (ref 1.7–7.7)
Neutrophils Relative %: 67 % (ref 43–77)
RBC: 4.44 MIL/uL (ref 3.87–5.11)

## 2011-12-17 LAB — PREGNANCY, URINE: Preg Test, Ur: NEGATIVE

## 2011-12-17 LAB — URINE MICROSCOPIC-ADD ON

## 2011-12-17 MED ORDER — ONDANSETRON HCL 4 MG/2ML IJ SOLN
4.0000 mg | Freq: Once | INTRAMUSCULAR | Status: AC
  Administered 2011-12-17: 4 mg via INTRAVENOUS
  Filled 2011-12-17: qty 2

## 2011-12-17 MED ORDER — SODIUM CHLORIDE 0.9 % IV SOLN
INTRAVENOUS | Status: DC
  Administered 2011-12-17: 10:00:00 via INTRAVENOUS

## 2011-12-17 MED ORDER — DOXYCYCLINE HYCLATE 100 MG PO CAPS: 100.0000 mg | ORAL_CAPSULE | Freq: Two times a day (BID) | ORAL | Status: DC

## 2011-12-17 MED ORDER — DEXTROSE 5 % IV SOLN
1.0000 g | INTRAVENOUS | Status: DC
  Administered 2011-12-17 (×2): 1 g via INTRAVENOUS
  Filled 2011-12-17: qty 10

## 2011-12-17 MED ORDER — FLUCONAZOLE 100 MG PO TABS
150.0000 mg | ORAL_TABLET | Freq: Once | ORAL | Status: AC
  Administered 2011-12-17: 150 mg via ORAL
  Filled 2011-12-17: qty 1

## 2011-12-17 MED ORDER — NAPROXEN 500 MG PO TABS: 500.0000 mg | ORAL_TABLET | Freq: Two times a day (BID) | ORAL | Status: DC

## 2011-12-17 MED ORDER — FENTANYL CITRATE 0.05 MG/ML IJ SOLN
50.0000 ug | Freq: Once | INTRAMUSCULAR | Status: AC
  Administered 2011-12-17: 50 ug via INTRAVENOUS
  Filled 2011-12-17: qty 2

## 2011-12-17 NOTE — ED Notes (Signed)
Pt presents to ED today with RLQ pain that started around 830 this am.  Pt reports "unable to stand up straight" Pt reports no changes in meses and no injury

## 2011-12-17 NOTE — ED Provider Notes (Signed)
History     CSN: [id number] Arrival date & time 12/17/11  0906  First MD Initiated Contact with Patient 12/17/11 [phone]    Chief Complaint  Patient presents with  . Abdominal Pain    HPI Patient presents to emergency room with moderate to severe right lower abdominal pain that began this morning. Patient states she felt fine last night. When she woke up this morning and tried to stand up she had severe pain in her right lower abdomen. It increased with movement. She has not had any trouble with nausea, vomiting, or diarrhea. She has had some slight vaginal discharge. Her last menstrual period was 2 weeks ago and somewhat heavier than normal. She does have history of gallbladder surgery no history of appendectomy Past Medical History  Diagnosis Date  . Migraine   . Depression   . Bipolar 1 disorder   . Otitis media   . Strep throat     Past Surgical History  Procedure Date  . Tonsillectomy   . Cholecystectomy   . Exploratory laparotomy   . Tubal ligation   . Exploratory laparotomy   . Tympanostomy     History reviewed. No pertinent family history.  History  Substance Use Topics  . Smoking status: Current Every Day Smoker -- 1.0 packs/day    Types: Cigarettes  . Smokeless tobacco: Never Used  . Alcohol Use: No    OB History    Grav Para Term Preterm Abortions TAB SAB Ect Mult Living                  Review of Systems  Gastrointestinal: Negative for diarrhea and constipation.  Genitourinary: Negative for dysuria.  All other systems reviewed and are negative.    Allergies  Morphine and related  Home Medications   Current Outpatient Rx  Name Route Sig Dispense Refill  . CLONAZEPAM 0.5 MG PO TABS Oral Take 0.5 mg by mouth 2 (two) times daily as needed.    . ARIPIPRAZOLE 5 MG PO TABS Oral Take 5 mg by mouth daily.     Marlin Canary HEADACHE PO Oral Take 1 packet by mouth once. Patient used this medication for pain.    Marland Kitchen CETIRIZINE HCL 10 MG PO TABS Oral Take 10 mg by  mouth daily. Patient uses this medication for her allergies.    . DESVENLAFAXINE SUCCINATE ER 50 MG PO TB24 Oral Take 100 mg by mouth daily.     . TOPIRAMATE 25 MG PO TABS Oral Take 75 mg by mouth 2 (two) times daily.       BP 123/64  Pulse 87  Temp 98.4 F (36.9 C) (Oral)  Resp 18  Ht 5\' 1"  (1.549 m)  Wt 179 lb (81.194 kg)  BMI 33.82 kg/m2  SpO2 97%  LMP 11/28/2011  Physical Exam  Nursing note and vitals reviewed. Constitutional: No distress.       Obese  HENT:  Head: Normocephalic and atraumatic.  Right Ear: External ear normal.  Left Ear: External ear normal.  Eyes: Conjunctivae normal are normal. Right eye exhibits no discharge. Left eye exhibits no discharge. No scleral icterus.  Neck: Neck supple. No tracheal deviation present.  Cardiovascular: Normal rate, regular rhythm and intact distal pulses.   Pulmonary/Chest: Effort normal and breath sounds normal. No stridor. No respiratory distress. She has no wheezes. She has no rales.  Abdominal: Soft. Bowel sounds are normal. She exhibits no distension and no mass. There is tenderness in the right lower quadrant. There  is no rigidity, no rebound and no guarding. No hernia. Hernia confirmed negative in the right inguinal area and confirmed negative in the left inguinal area.  Genitourinary: Uterus is tender. Cervix exhibits discharge. Cervix exhibits no motion tenderness. Right adnexum displays tenderness. Right adnexum displays no mass and no fullness. Left adnexum displays tenderness. Left adnexum displays no mass and no fullness. No tenderness around the vagina. No foreign body around the vagina. Vaginal discharge found.  Musculoskeletal: She exhibits no edema and no tenderness.  Neurological: She is alert. She has normal strength. No sensory deficit. Cranial nerve deficit:  no gross defecits noted. She exhibits normal muscle tone. She displays no seizure activity. Coordination normal.  Skin: Skin is warm and dry. No rash noted.    Psychiatric: She has a normal mood and affect.    ED Course  Procedures (including critical care time)  Labs Reviewed  URINALYSIS, ROUTINE W REFLEX MICROSCOPIC - Abnormal; Notable for the following:    Specific Gravity, Urine 1.004 (*)     Leukocytes, UA SMALL (*)     All other components within normal limits  CBC WITH DIFFERENTIAL - Abnormal; Notable for the following:    WBC 11.0 (*)     Eosinophils Relative 6 (*)     All other components within normal limits  BASIC METABOLIC PANEL - Abnormal; Notable for the following:    Glucose, Bld 120 (*)     GFR calc non Af Amer 74 (*)     GFR calc Af Amer 86 (*)     All other components within normal limits  WET PREP, GENITAL - Abnormal; Notable for the following:    Yeast Wet Prep HPF POC MODERATE (*)     Clue Cells Wet Prep HPF POC TOO NUMEROUS TO COUNT (*)     WBC, Wet Prep HPF POC MODERATE (*)     All other components within normal limits  URINE MICROSCOPIC-ADD ON - Abnormal; Notable for the following:    Squamous Epithelial / LPF FEW (*)     Bacteria, UA FEW (*)     All other components within normal limits  PREGNANCY, URINE  GC/CHLAMYDIA PROBE AMP, GENITAL   No results found.   1. Cervicitis       MDM  The patient's symptoms are consistent with a cervicitis and possible early PID. She has no adnexal fullness I doubt tubo-ovarian abscess. I doubt appendicitis. Patient was discharged home with prescriptions for antibiotics. I do recommend followup with a gynecologist in the next week. Warning signs were discussed with prompt return to the emergency department. At this time there does not appear to be any evidence of an acute emergency medical condition and the patient appears stable for discharge with appropriate outpatient follow up.         Celene Kras, MD 12/17/11 1055

## 2011-12-18 ENCOUNTER — Encounter (HOSPITAL_BASED_OUTPATIENT_CLINIC_OR_DEPARTMENT_OTHER): Payer: Self-pay | Admitting: *Deleted

## 2011-12-18 ENCOUNTER — Emergency Department (HOSPITAL_BASED_OUTPATIENT_CLINIC_OR_DEPARTMENT_OTHER)
Admission: EM | Admit: 2011-12-18 | Discharge: 2011-12-18 | Disposition: A | Discharge status: Home / Self Care | Payer: Self-pay | Attending: Emergency Medicine | Admitting: Emergency Medicine

## 2011-12-18 ENCOUNTER — Emergency Department (HOSPITAL_BASED_OUTPATIENT_CLINIC_OR_DEPARTMENT_OTHER): Payer: Self-pay

## 2011-12-18 DIAGNOSIS — F3130 Bipolar disorder, current episode depressed, mild or moderate severity, unspecified: Secondary | ICD-10-CM | POA: Insufficient documentation

## 2011-12-18 DIAGNOSIS — N739 Female pelvic inflammatory disease, unspecified: Secondary | ICD-10-CM | POA: Insufficient documentation

## 2011-12-18 DIAGNOSIS — R109 Unspecified abdominal pain: Secondary | ICD-10-CM | POA: Insufficient documentation

## 2011-12-18 DIAGNOSIS — E669 Obesity, unspecified: Secondary | ICD-10-CM | POA: Insufficient documentation

## 2011-12-18 DIAGNOSIS — Z79899 Other long term (current) drug therapy: Secondary | ICD-10-CM | POA: Insufficient documentation

## 2011-12-18 DIAGNOSIS — N730 Acute parametritis and pelvic cellulitis: Secondary | ICD-10-CM

## 2011-12-18 LAB — COMPREHENSIVE METABOLIC PANEL
BUN: 10 mg/dL (ref 6–23)
CO2: 21 mEq/L (ref 19–32)
Calcium: 9.7 mg/dL (ref 8.4–10.5)
Creatinine, Ser: 1.1 mg/dL (ref 0.50–1.10)
GFR calc Af Amer: 77 mL/min — ABNORMAL LOW (ref >90)
GFR calc non Af Amer: 66 mL/min — ABNORMAL LOW (ref >90)
Glucose, Bld: 95 mg/dL (ref 70–99)
Total Protein: 7.2 g/dL (ref 6.0–8.3)

## 2011-12-18 LAB — CBC WITH DIFFERENTIAL/PLATELET
Eosinophils Absolute: 0.9 10*3/uL — ABNORMAL HIGH (ref 0.0–0.7)
Eosinophils Relative: 8 % — ABNORMAL HIGH (ref 0–5)
HCT: 38.7 % (ref 36.0–46.0)
Hemoglobin: 13.5 g/dL (ref 12.0–15.0)
Lymphs Abs: 2.9 10*3/uL (ref 0.7–4.0)
MCH: 31 pg (ref 26.0–34.0)
MCV: 88.8 fL (ref 78.0–100.0)
Monocytes Absolute: 0.8 10*3/uL (ref 0.1–1.0)
Monocytes Relative: 8 % (ref 3–12)
Platelets: 301 10*3/uL (ref 150–400)
RBC: 4.36 MIL/uL (ref 3.87–5.11)

## 2011-12-18 LAB — URINALYSIS, ROUTINE W REFLEX MICROSCOPIC
Nitrite: NEGATIVE
Specific Gravity, Urine: 1.006 (ref 1.005–1.030)
Urobilinogen, UA: 0.2 mg/dL (ref 0.0–1.0)
pH: 5.5 (ref 5.0–8.0)

## 2011-12-18 LAB — GC/CHLAMYDIA PROBE AMP, GENITAL: Chlamydia, DNA Probe: NEGATIVE

## 2011-12-18 LAB — URINE MICROSCOPIC-ADD ON: RBC / HPF: NONE SEEN RBC/hpf (ref <3)

## 2011-12-18 MED ORDER — DEXTROSE 5 % IV SOLN
2.0000 g | Freq: Once | INTRAVENOUS | Status: AC
  Administered 2011-12-18: 2 g via INTRAVENOUS
  Filled 2011-12-18: qty 2

## 2011-12-18 MED ORDER — OXYCODONE-ACETAMINOPHEN 5-325 MG PO TABS: 1.0000 | ORAL_TABLET | ORAL | Status: DC | PRN

## 2011-12-18 MED ORDER — HYDROMORPHONE HCL PF 1 MG/ML IJ SOLN
1.0000 mg | Freq: Once | INTRAMUSCULAR | Status: AC
  Administered 2011-12-18: 1 mg via INTRAVENOUS
  Filled 2011-12-18: qty 1

## 2011-12-18 MED ORDER — ONDANSETRON HCL 4 MG/2ML IJ SOLN
4.0000 mg | Freq: Once | INTRAMUSCULAR | Status: AC
  Administered 2011-12-18: 4 mg via INTRAVENOUS
  Filled 2011-12-18: qty 2

## 2011-12-18 MED ORDER — SODIUM CHLORIDE 0.9 % IV BOLUS (SEPSIS)
1000.0000 mL | Freq: Once | INTRAVENOUS | Status: AC
  Administered 2011-12-18: 1000 mL via INTRAVENOUS

## 2011-12-18 NOTE — ED Provider Notes (Signed)
Ultrasound report shows no evidence of tubo-ovarian abscess. She is sent home with prescription for Percocet for pain and [facility]  Dione Booze, MD 12/18/11 1013

## 2011-12-18 NOTE — ED Notes (Signed)
Pt. C/o lower abd pain that started yesterday. Was seen here at [facility] yesterday for same symptoms. States pain is constant. Describes as sharp.  denies any vaginal bleeding, fevers, n/v.  Has been taking antibiotics and naproxen for same without relief. Denies any urinary symptoms.

## 2011-12-18 NOTE — ED Notes (Signed)
Patient transported to CT 

## 2011-12-18 NOTE — ED Notes (Signed)
Report given to next shift.

## 2011-12-18 NOTE — ED Notes (Signed)
Returned from u/s

## 2011-12-18 NOTE — ED Notes (Signed)
PAIN LEVEL HAS GONE UP --PER HUSBAND

## 2011-12-18 NOTE — ED Provider Notes (Signed)
History     CSN: [id number]  Arrival date & time 12/18/11  0614   First MD Initiated Contact with Patient 12/18/11 [phone]      Chief Complaint  Patient presents with  . Abdominal Pain    (Consider location/radiation/quality/duration/timing/severity/associated sxs/prior treatment) HPI The patient presents with abdominal pain. The pain began approximately one day ago, gradually.  Since onset the pain is become more pronounced, across the lower abdomen.  Pain is sharp.  The patient denies any concurrent vaginal bleeding, fever, nausea, vomiting. Notably, the patient was seen here less than 24 hours ago, following exam she was diagnosed with cervicitis, early PID.  She notes that since discharge she has been taking her doxycycline, and Naprosyn without any relief from her pain.  No clear exacerbating factors either.  Past Medical History  Diagnosis Date  . Migraine   . Depression   . Bipolar 1 disorder   . Otitis media   . Strep throat     Past Surgical History  Procedure Date  . Tonsillectomy   . Cholecystectomy   . Exploratory laparotomy   . Tubal ligation   . Exploratory laparotomy   . Tympanostomy     No family history on file.  History  Substance Use Topics  . Smoking status: Current Every Day Smoker -- 1.0 packs/day    Types: Cigarettes  . Smokeless tobacco: Never Used  . Alcohol Use: No    OB History    Grav Para Term Preterm Abortions TAB SAB Ect Mult Living                  Review of Systems  Constitutional:       HPI  HENT:       HPI otherwise negative  Eyes: Negative.   Respiratory:       HPI, otherwise negative  Cardiovascular:       HPI, otherwise nmegative  Gastrointestinal: Positive for abdominal pain. Negative for vomiting and diarrhea.  Genitourinary: Negative for dysuria, hematuria, flank pain, decreased urine volume, vaginal bleeding, vaginal discharge and pelvic pain.          Musculoskeletal:       HPI, otherwise negative  Skin:  Negative.   Neurological: Negative.     Allergies  Morphine and related  Home Medications   Current Outpatient Rx  Name Route Sig Dispense Refill  . ARIPIPRAZOLE 5 MG PO TABS Oral Take 5 mg by mouth daily.     Marlin Canary HEADACHE PO Oral Take 1 packet by mouth once. Patient used this medication for pain.    Marland Kitchen CETIRIZINE HCL 10 MG PO TABS Oral Take 10 mg by mouth daily. Patient uses this medication for her allergies.    Marland Kitchen CLONAZEPAM 0.5 MG PO TABS Oral Take 0.5 mg by mouth 2 (two) times daily as needed.    . DESVENLAFAXINE SUCCINATE ER 50 MG PO TB24 Oral Take 100 mg by mouth daily.     Marland Kitchen DOXYCYCLINE HYCLATE 100 MG PO CAPS Oral Take 1 capsule (100 mg total) by mouth 2 (two) times daily. 20 capsule 0  . NAPROXEN 500 MG PO TABS Oral Take 1 tablet (500 mg total) by mouth 2 (two) times daily. 30 tablet 0  . TOPIRAMATE 25 MG PO TABS Oral Take 75 mg by mouth 2 (two) times daily.       BP 125/72  Temp 98 F (36.7 C) (Oral)  Resp 18  Ht 5\' 1"  (1.549 m)  Wt 179 lb (  81.194 kg)  BMI 33.82 kg/m2  SpO2 97%  LMP 11/28/2011  Physical Exam  Nursing note and vitals reviewed. Constitutional: No distress.       Obese  HENT:  Head: Normocephalic and atraumatic.  Right Ear: External ear normal.  Left Ear: External ear normal.  Eyes: Conjunctivae normal are normal. Right eye exhibits no discharge. Left eye exhibits no discharge. No scleral icterus.  Neck: Neck supple. No tracheal deviation present.  Cardiovascular: Normal rate, regular rhythm and intact distal pulses.   Pulmonary/Chest: Effort normal and breath sounds normal. No stridor. No respiratory distress. She has no wheezes. She has no rales.  Abdominal: Soft. Bowel sounds are normal. She exhibits no distension and no mass. There is tenderness in the right lower quadrant, suprapubic area and left lower quadrant. There is no rigidity, no rebound and no guarding. No hernia.  Genitourinary:       We discussed the need for additional pelvic exam  for thoroughness.  The patient described pain during the last eval, and defers repeat exam. Given the patient's genital exam within 24 hours this was not repeated  Musculoskeletal: She exhibits no edema and no tenderness.  Lymphadenopathy:       Right: No inguinal adenopathy present.       Left: No inguinal adenopathy present.  Neurological: She is alert. She has normal strength. No sensory deficit. Cranial nerve deficit:  no gross defecits noted. She exhibits normal muscle tone. She displays no seizure activity. Coordination normal.  Skin: Skin is warm and dry. No rash noted.  Psychiatric: She has a normal mood and affect.    ED Course  Procedures (including critical care time)   Labs Reviewed  CBC WITH DIFFERENTIAL  COMPREHENSIVE METABOLIC PANEL  URINALYSIS, ROUTINE W REFLEX MICROSCOPIC   No results found.   No diagnosis found.    MDM  This patient presents with persistent pain after a recent evaluation resulted in a diagnosis of cervicitis with concern for early PID.  On exam the patient is very uncomfortable appearing, though she is unremarkable vital signs.  The patient's denial of any fever, no chills, no vomiting, and other new complaints is somewhat reassuring.  However given the persistency of her symptoms she will be evaluated for tubo-ovarian abscess, severe PID.  Patient signed out to Dr. Preston Fleeting.  On chart review the [place] was not c/w TOA, and the patient was d/c w Gyne F/U.    Gerhard Munch, MD 12/20/11 1505

## 2012-01-03 ENCOUNTER — Emergency Department (HOSPITAL_BASED_OUTPATIENT_CLINIC_OR_DEPARTMENT_OTHER)
Admission: EM | Admit: 2012-01-03 | Discharge: 2012-01-03 | Disposition: A | Discharge status: Home / Self Care | Payer: Self-pay | Attending: Emergency Medicine | Admitting: Emergency Medicine

## 2012-01-03 ENCOUNTER — Encounter (HOSPITAL_BASED_OUTPATIENT_CLINIC_OR_DEPARTMENT_OTHER): Payer: Self-pay | Admitting: *Deleted

## 2012-01-03 DIAGNOSIS — R110 Nausea: Secondary | ICD-10-CM | POA: Insufficient documentation

## 2012-01-03 DIAGNOSIS — Z8619 Personal history of other infectious and parasitic diseases: Secondary | ICD-10-CM | POA: Insufficient documentation

## 2012-01-03 DIAGNOSIS — F3289 Other specified depressive episodes: Secondary | ICD-10-CM | POA: Insufficient documentation

## 2012-01-03 DIAGNOSIS — G43909 Migraine, unspecified, not intractable, without status migrainosus: Secondary | ICD-10-CM | POA: Insufficient documentation

## 2012-01-03 DIAGNOSIS — F17200 Nicotine dependence, unspecified, uncomplicated: Secondary | ICD-10-CM | POA: Insufficient documentation

## 2012-01-03 DIAGNOSIS — F329 Major depressive disorder, single episode, unspecified: Secondary | ICD-10-CM | POA: Insufficient documentation

## 2012-01-03 DIAGNOSIS — F319 Bipolar disorder, unspecified: Secondary | ICD-10-CM | POA: Insufficient documentation

## 2012-01-03 MED ORDER — SODIUM CHLORIDE 0.9 % IV BOLUS (SEPSIS)
1000.0000 mL | Freq: Once | INTRAVENOUS | Status: AC
  Administered 2012-01-03: 1000 mL via INTRAVENOUS

## 2012-01-03 MED ORDER — METOCLOPRAMIDE HCL 5 MG/ML IJ SOLN
10.0000 mg | Freq: Once | INTRAMUSCULAR | Status: AC
  Administered 2012-01-03: 10 mg via INTRAVENOUS
  Filled 2012-01-03: qty 2

## 2012-01-03 MED ORDER — KETOROLAC TROMETHAMINE 30 MG/ML IJ SOLN
30.0000 mg | Freq: Once | INTRAMUSCULAR | Status: AC
  Administered 2012-01-03: 30 mg via INTRAVENOUS
  Filled 2012-01-03: qty 1

## 2012-01-03 MED ORDER — DIPHENHYDRAMINE HCL 50 MG/ML IJ SOLN
25.0000 mg | Freq: Once | INTRAMUSCULAR | Status: AC
  Administered 2012-01-03: 25 mg via INTRAVENOUS
  Filled 2012-01-03: qty 1

## 2012-01-03 NOTE — ED Notes (Signed)
C/o migraine headache since yesterday with no relief from meds. Denies any vomiting but does have periods of nausea.

## 2012-01-03 NOTE — ED Provider Notes (Signed)
History     CSN: [id number]  Arrival date & time 01/03/12  0307   First MD Initiated Contact with Patient 01/03/12 [phone]      Chief Complaint  Patient presents with  . Migraine    (Consider location/radiation/quality/duration/timing/severity/associated sxs/prior treatment) HPI This is a 31 year old female with history of migraines. She developed a headache yesterday consistent with previous migraines. She took Excedrin Migraine with only partial relief. Her pain continues to be moderate to severe. She has photophobia and nausea but no vomiting. She's had no focal neurologic deficits.  Past Medical History  Diagnosis Date  . Migraine   . Depression   . Bipolar 1 disorder   . Otitis media   . Strep throat     Past Surgical History  Procedure Date  . Tonsillectomy   . Cholecystectomy   . Exploratory laparotomy   . Tubal ligation   . Exploratory laparotomy   . Tympanostomy     History reviewed. No pertinent family history.  History  Substance Use Topics  . Smoking status: Current Every Day Smoker -- 1.0 packs/day    Types: Cigarettes  . Smokeless tobacco: Never Used  . Alcohol Use: No    OB History    Grav Para Term Preterm Abortions TAB SAB Ect Mult Living                  Review of Systems  All other systems reviewed and are negative.    Allergies  Morphine and related  Home Medications   Current Outpatient Rx  Name Route Sig Dispense Refill  . ARIPIPRAZOLE 5 MG PO TABS Oral Take 5 mg by mouth daily.     Marlin Canary HEADACHE PO Oral Take 1 packet by mouth once. Patient used this medication for pain.    Marland Kitchen CETIRIZINE HCL 10 MG PO TABS Oral Take 10 mg by mouth daily. Patient uses this medication for her allergies.    Marland Kitchen CLONAZEPAM 0.5 MG PO TABS Oral Take 0.5 mg by mouth 2 (two) times daily as needed.    . DESVENLAFAXINE SUCCINATE ER 50 MG PO TB24 Oral Take 100 mg by mouth daily.     . TOPIRAMATE 25 MG PO TABS Oral Take 75 mg by mouth 2 (two) times daily.      Marland Kitchen DOXYCYCLINE HYCLATE 100 MG PO CAPS Oral Take 1 capsule (100 mg total) by mouth 2 (two) times daily. 20 capsule 0  . NAPROXEN 500 MG PO TABS Oral Take 1 tablet (500 mg total) by mouth 2 (two) times daily. 30 tablet 0  . OXYCODONE-ACETAMINOPHEN 5-325 MG PO TABS Oral Take 1 tablet by mouth every 4 (four) hours as needed for pain. 20 tablet 0    BP 121/69  Pulse 75  Temp 97.8 F (36.6 C) (Oral)  Resp 18  Ht 5\' 1"  (1.549 m)  Wt 180 lb (81.647 kg)  BMI 34.01 kg/m2  SpO2 99%  Physical Exam General: Well-developed, well-nourished female in no acute distress; appearance consistent with age of record HENT: normocephalic, atraumatic Eyes: pupils equal round and reactive to light; extraocular muscles intact; photophobia Neck: supple Heart: regular rate and rhythm Lungs: clear to auscultation bilaterally Abdomen: soft; nondistended; nontender; bowel sounds present Extremities: No deformity; full range of motion; pulses normal; no edema Neurologic: Awake, alert and oriented; motor function intact in all extremities and symmetric; no facial droop Skin: Warm and dry     ED Course  Procedures (including critical care time)  MDM  4:18 AM Headache and nausea resolved after IV medications and IV fluid bolus.        Hanley Seamen, MD 01/03/12 [phone]

## 2012-01-03 NOTE — ED Notes (Signed)
Pt reports migraine since yesterday. Reports photophobia and nausea. States that she gets migraines around her menses cycle. Pt reports headache 8/10 at this time. Took Excedrin migraine with no relief.

## 2012-01-19 ENCOUNTER — Encounter (HOSPITAL_BASED_OUTPATIENT_CLINIC_OR_DEPARTMENT_OTHER): Payer: Self-pay | Admitting: Emergency Medicine

## 2012-01-19 ENCOUNTER — Emergency Department (HOSPITAL_BASED_OUTPATIENT_CLINIC_OR_DEPARTMENT_OTHER)
Admission: EM | Admit: 2012-01-19 | Discharge: 2012-01-19 | Disposition: A | Discharge status: Home / Self Care | Payer: Self-pay | Attending: Emergency Medicine | Admitting: Emergency Medicine

## 2012-01-19 DIAGNOSIS — Z79899 Other long term (current) drug therapy: Secondary | ICD-10-CM | POA: Insufficient documentation

## 2012-01-19 DIAGNOSIS — F3289 Other specified depressive episodes: Secondary | ICD-10-CM | POA: Insufficient documentation

## 2012-01-19 DIAGNOSIS — J069 Acute upper respiratory infection, unspecified: Secondary | ICD-10-CM | POA: Insufficient documentation

## 2012-01-19 DIAGNOSIS — F329 Major depressive disorder, single episode, unspecified: Secondary | ICD-10-CM | POA: Insufficient documentation

## 2012-01-19 DIAGNOSIS — Z9089 Acquired absence of other organs: Secondary | ICD-10-CM | POA: Insufficient documentation

## 2012-01-19 DIAGNOSIS — J029 Acute pharyngitis, unspecified: Secondary | ICD-10-CM | POA: Insufficient documentation

## 2012-01-19 DIAGNOSIS — F309 Manic episode, unspecified: Secondary | ICD-10-CM | POA: Insufficient documentation

## 2012-01-19 DIAGNOSIS — H9209 Otalgia, unspecified ear: Secondary | ICD-10-CM | POA: Insufficient documentation

## 2012-01-19 DIAGNOSIS — G43909 Migraine, unspecified, not intractable, without status migrainosus: Secondary | ICD-10-CM | POA: Insufficient documentation

## 2012-01-19 DIAGNOSIS — F17200 Nicotine dependence, unspecified, uncomplicated: Secondary | ICD-10-CM | POA: Insufficient documentation

## 2012-01-19 DIAGNOSIS — H9201 Otalgia, right ear: Secondary | ICD-10-CM

## 2012-01-19 LAB — RAPID STREP SCREEN (MED CTR MEBANE ONLY): Streptococcus, Group A Screen (Direct): NEGATIVE

## 2012-01-19 MED ORDER — ANTIPYRINE-BENZOCAINE 5.4-1.4 % OT SOLN: 3.0000 [drp] | Freq: Four times a day (QID) | OTIC | Status: DC | PRN

## 2012-01-19 MED ORDER — NAPROXEN 250 MG PO TABS
500.0000 mg | ORAL_TABLET | Freq: Once | ORAL | Status: AC
  Administered 2012-01-19: 500 mg via ORAL
  Filled 2012-01-19: qty 2

## 2012-01-19 MED ORDER — NAPROXEN 375 MG PO TABS: 375.0000 mg | ORAL_TABLET | Freq: Two times a day (BID) | ORAL | Status: DC

## 2012-01-19 NOTE — ED Notes (Signed)
MD at bedside. 

## 2012-01-19 NOTE — ED Provider Notes (Signed)
History     CSN: [id number]  Arrival date & time 01/19/12  0143   None     No chief complaint on file.   (Consider location/radiation/quality/duration/timing/severity/associated sxs/prior treatment) Patient is a 31 y.o. female presenting with pharyngitis and ear pain. The history is provided by the patient. No language interpreter was used.  Sore Throat This is a new problem. The current episode started 6 to 12 hours ago. The problem occurs constantly. The problem has not changed since onset.Pertinent negatives include no chest pain, no abdominal pain, no headaches and no shortness of breath. Nothing aggravates the symptoms. Nothing relieves the symptoms. She has tried nothing for the symptoms. The treatment provided no relief.  Otalgia This is a new problem. The current episode started 6 to 12 hours ago. There is pain in the right ear. The problem occurs constantly. The problem has not changed since onset.There has been no fever. The pain is severe. Associated symptoms include sore throat. Pertinent negatives include no ear discharge, no headaches, no rhinorrhea, no abdominal pain, no diarrhea, no vomiting, no neck pain, no cough and no rash. Her past medical history does not include chronic ear infection.  Also nasal congestion  Past Medical History  Diagnosis Date  . Migraine   . Depression   . Bipolar 1 disorder   . Otitis media   . Strep throat     Past Surgical History  Procedure Date  . Tonsillectomy   . Cholecystectomy   . Exploratory laparotomy   . Tubal ligation   . Exploratory laparotomy   . Tympanostomy     No family history on file.  History  Substance Use Topics  . Smoking status: Current Every Day Smoker -- 1.0 packs/day    Types: Cigarettes  . Smokeless tobacco: Never Used  . Alcohol Use: No    OB History    Grav Para Term Preterm Abortions TAB SAB Ect Mult Living                  Review of Systems  HENT: Positive for ear pain, congestion and  sore throat. Negative for facial swelling, rhinorrhea, trouble swallowing, neck pain, neck stiffness, voice change and ear discharge.   Respiratory: Negative for cough and shortness of breath.   Cardiovascular: Negative for chest pain.  Gastrointestinal: Negative for vomiting, abdominal pain and diarrhea.  Skin: Negative for rash.  Neurological: Negative for headaches.  All other systems reviewed and are negative.    Allergies  Morphine and related and Reglan  Home Medications   Current Outpatient Rx  Name  Route  Sig  Dispense  Refill  . ARIPIPRAZOLE 5 MG PO TABS   Oral   Take 5 mg by mouth daily.          Marlin Canary HEADACHE PO   Oral   Take 1 packet by mouth once. Patient used this medication for pain.         Marland Kitchen CETIRIZINE HCL 10 MG PO TABS   Oral   Take 10 mg by mouth daily. Patient uses this medication for her allergies.         Marland Kitchen CLONAZEPAM 0.5 MG PO TABS   Oral   Take 0.5 mg by mouth 2 (two) times daily as needed.         . DESVENLAFAXINE SUCCINATE ER 50 MG PO TB24   Oral   Take 100 mg by mouth daily.          Marland Kitchen DOXYCYCLINE  HYCLATE 100 MG PO CAPS   Oral   Take 1 capsule (100 mg total) by mouth 2 (two) times daily.   20 capsule   0   . NAPROXEN 500 MG PO TABS   Oral   Take 1 tablet (500 mg total) by mouth 2 (two) times daily.   30 tablet   0   . OXYCODONE-ACETAMINOPHEN 5-325 MG PO TABS   Oral   Take 1 tablet by mouth every 4 (four) hours as needed for pain.   20 tablet   0   . TOPIRAMATE 25 MG PO TABS   Oral   Take 75 mg by mouth 2 (two) times daily.            There were no vitals taken for this visit.  Physical Exam  Constitutional: She is oriented to person, place, and time. She appears well-developed and well-nourished. No distress.  HENT:  Head: Normocephalic and atraumatic.  Right Ear: External ear normal. No mastoid tenderness. Tympanic membrane is not injected. No hemotympanum.  Left Ear: External ear normal. No mastoid  tenderness. Tympanic membrane is not injected. No hemotympanum.  Mouth/Throat: Oropharynx is clear and moist. No oropharyngeal exudate.  Eyes: Conjunctivae normal are normal. Pupils are equal, round, and reactive to light.  Neck: Normal range of motion. Neck supple. No tracheal deviation present.  Cardiovascular: Normal rate and regular rhythm.   Pulmonary/Chest: Effort normal and breath sounds normal. No stridor. She has no wheezes. She has no rales.  Abdominal: Soft. Bowel sounds are normal. There is no tenderness. There is no rebound and no guarding.  Musculoskeletal: Normal range of motion.  Lymphadenopathy:    She has no cervical adenopathy.  Neurological: She is alert and oriented to person, place, and time.  Skin: Skin is warm and dry. No rash noted.  Psychiatric: She has a normal mood and affect.    ED Course  Procedures (including critical care time)  Labs Reviewed - No data to display No results found.   No diagnosis found.    MDM   Will provide pain relief, symptomatic relief.  Lots of fluids.  Symptoms viral in nature.          Jasmine Awe, MD 01/19/12 [phone]

## 2012-01-19 NOTE — ED Notes (Signed)
Pt c/o sore throat, right ear pain and congestion.

## 2012-01-26 ENCOUNTER — Encounter (HOSPITAL_BASED_OUTPATIENT_CLINIC_OR_DEPARTMENT_OTHER): Payer: Self-pay | Admitting: *Deleted

## 2012-01-26 ENCOUNTER — Emergency Department (HOSPITAL_BASED_OUTPATIENT_CLINIC_OR_DEPARTMENT_OTHER)
Admission: EM | Admit: 2012-01-26 | Discharge: 2012-01-26 | Disposition: A | Discharge status: Home / Self Care | Payer: Self-pay | Attending: Emergency Medicine | Admitting: Emergency Medicine

## 2012-01-26 ENCOUNTER — Emergency Department (HOSPITAL_BASED_OUTPATIENT_CLINIC_OR_DEPARTMENT_OTHER): Payer: Self-pay

## 2012-01-26 DIAGNOSIS — F319 Bipolar disorder, unspecified: Secondary | ICD-10-CM | POA: Insufficient documentation

## 2012-01-26 DIAGNOSIS — Z79899 Other long term (current) drug therapy: Secondary | ICD-10-CM | POA: Insufficient documentation

## 2012-01-26 DIAGNOSIS — F3289 Other specified depressive episodes: Secondary | ICD-10-CM | POA: Insufficient documentation

## 2012-01-26 DIAGNOSIS — F329 Major depressive disorder, single episode, unspecified: Secondary | ICD-10-CM | POA: Insufficient documentation

## 2012-01-26 DIAGNOSIS — J40 Bronchitis, not specified as acute or chronic: Secondary | ICD-10-CM | POA: Insufficient documentation

## 2012-01-26 DIAGNOSIS — H6690 Otitis media, unspecified, unspecified ear: Secondary | ICD-10-CM | POA: Insufficient documentation

## 2012-01-26 DIAGNOSIS — F17200 Nicotine dependence, unspecified, uncomplicated: Secondary | ICD-10-CM | POA: Insufficient documentation

## 2012-01-26 DIAGNOSIS — R059 Cough, unspecified: Secondary | ICD-10-CM | POA: Insufficient documentation

## 2012-01-26 DIAGNOSIS — G43909 Migraine, unspecified, not intractable, without status migrainosus: Secondary | ICD-10-CM | POA: Insufficient documentation

## 2012-01-26 DIAGNOSIS — J45901 Unspecified asthma with (acute) exacerbation: Secondary | ICD-10-CM | POA: Insufficient documentation

## 2012-01-26 DIAGNOSIS — Z9889 Other specified postprocedural states: Secondary | ICD-10-CM | POA: Insufficient documentation

## 2012-01-26 DIAGNOSIS — R112 Nausea with vomiting, unspecified: Secondary | ICD-10-CM | POA: Insufficient documentation

## 2012-01-26 DIAGNOSIS — Z791 Long term (current) use of non-steroidal anti-inflammatories (NSAID): Secondary | ICD-10-CM | POA: Insufficient documentation

## 2012-01-26 DIAGNOSIS — R05 Cough: Secondary | ICD-10-CM | POA: Insufficient documentation

## 2012-01-26 DIAGNOSIS — H53149 Visual discomfort, unspecified: Secondary | ICD-10-CM | POA: Insufficient documentation

## 2012-01-26 HISTORY — DX: Unspecified asthma, uncomplicated: J45.909

## 2012-01-26 MED ORDER — GUAIFENESIN-CODEINE 100-10 MG/5ML PO SOLN
10.0000 mL | ORAL | Status: DC | PRN
  Administered 2012-01-26: 10 mL via ORAL
  Filled 2012-01-26: qty 10

## 2012-01-26 MED ORDER — ALBUTEROL SULFATE (5 MG/ML) 0.5% IN NEBU
5.0000 mg | INHALATION_SOLUTION | Freq: Once | RESPIRATORY_TRACT | Status: AC
  Administered 2012-01-26: 5 mg via RESPIRATORY_TRACT
  Filled 2012-01-26: qty 1

## 2012-01-26 MED ORDER — PROMETHAZINE HCL 25 MG/ML IJ SOLN
25.0000 mg | Freq: Once | INTRAMUSCULAR | Status: AC
  Administered 2012-01-26: 25 mg via INTRAVENOUS
  Filled 2012-01-26: qty 1

## 2012-01-26 MED ORDER — BENZONATATE 200 MG PO CAPS: 200.0000 mg | ORAL_CAPSULE | Freq: Three times a day (TID) | ORAL | Status: DC | PRN

## 2012-01-26 MED ORDER — KETOROLAC TROMETHAMINE 60 MG/2ML IM SOLN
60.0000 mg | Freq: Once | INTRAMUSCULAR | Status: AC
  Administered 2012-01-26: 60 mg via INTRAMUSCULAR
  Filled 2012-01-26: qty 2

## 2012-01-26 MED ORDER — SODIUM CHLORIDE 0.9 % IV BOLUS (SEPSIS)
1000.0000 mL | Freq: Once | INTRAVENOUS | Status: AC
  Administered 2012-01-26: 1000 mL via INTRAVENOUS

## 2012-01-26 MED ORDER — KETOROLAC TROMETHAMINE 30 MG/ML IJ SOLN
30.0000 mg | Freq: Once | INTRAMUSCULAR | Status: AC
  Administered 2012-01-26: 30 mg via INTRAVENOUS
  Filled 2012-01-26: qty 1

## 2012-01-26 MED ORDER — PROMETHAZINE HCL 25 MG/ML IJ SOLN
25.0000 mg | Freq: Once | INTRAMUSCULAR | Status: AC
  Administered 2012-01-26: 25 mg via INTRAMUSCULAR
  Filled 2012-01-26: qty 1

## 2012-01-26 MED ORDER — GUAIFENESIN-CODEINE 100-10 MG/5ML PO SOLN: 5.0000 mL | Freq: Three times a day (TID) | ORAL | Status: DC | PRN

## 2012-01-26 MED ORDER — PREDNISONE 10 MG PO TABS: ORAL_TABLET | ORAL | Status: DC

## 2012-01-26 MED ORDER — ALBUTEROL SULFATE HFA 108 (90 BASE) MCG/ACT IN AERS
2.0000 | INHALATION_SPRAY | RESPIRATORY_TRACT | Status: DC | PRN
  Filled 2012-01-26: qty 6.7

## 2012-01-26 NOTE — Progress Notes (Signed)
RT discussed with patient the correct technique using a MDI with a spacer.  Patient demonstrated understanding of how to use and MDI with a spacer correctly.

## 2012-01-26 NOTE — ED Provider Notes (Signed)
History     CSN: [id number]  Arrival date & time 01/26/12  1722   First MD Initiated Contact with Patient 01/26/12 1800      Chief Complaint  Patient presents with  . Shortness of Breath    (Consider location/radiation/quality/duration/timing/severity/associated sxs/prior treatment) Patient is a 31 y.o. female presenting with shortness of breath. The history is provided by the patient. No language interpreter was used.  Shortness of Breath  The current episode started 2 days ago. The onset was gradual. The problem occurs continuously. The problem has been gradually worsening. The problem is moderate. Nothing relieves the symptoms. Nothing aggravates the symptoms. Associated symptoms include shortness of breath. There was no intake of a foreign body. Her past medical history does not include asthma or past wheezing. Urine output has been normal. There were no sick contacts.  Pt complains of shortness of breath and cough.  Pt reports cough is triggering her migranes.  Past Medical History  Diagnosis Date  . Migraine   . Depression   . Bipolar 1 disorder   . Otitis media   . Strep throat   . Asthma     Past Surgical History  Procedure Date  . Tonsillectomy   . Cholecystectomy   . Exploratory laparotomy   . Tubal ligation   . Exploratory laparotomy   . Tympanostomy     No family history on file.  History  Substance Use Topics  . Smoking status: Current Every Day Smoker -- 1.0 packs/day    Types: Cigarettes  . Smokeless tobacco: Never Used  . Alcohol Use: No    OB History    Grav Para Term Preterm Abortions TAB SAB Ect Mult Living                  Review of Systems  Respiratory: Positive for shortness of breath.   All other systems reviewed and are negative.    Allergies  Morphine and related and Reglan  Home Medications   Current Outpatient Rx  Name  Route  Sig  Dispense  Refill  . ALBUTEROL SULFATE HFA 108 (90 BASE) MCG/ACT IN AERS   Inhalation  Inhale 2 puffs into the lungs every 6 (six) hours as needed.         . ANTIPYRINE-BENZOCAINE 5.4-1.4 % OT SOLN   Right Ear   Place 3 drops into the right ear 4 (four) times daily as needed for pain.   10 mL   0   . ARIPIPRAZOLE 5 MG PO TABS   Oral   Take 5 mg by mouth daily.          Marlin Canary HEADACHE PO   Oral   Take 1 packet by mouth once. Patient used this medication for pain.         Marland Kitchen BENZONATATE 200 MG PO CAPS   Oral   Take 1 capsule (200 mg total) by mouth 3 (three) times daily as needed for cough.   30 capsule   0   . CETIRIZINE HCL 10 MG PO TABS   Oral   Take 10 mg by mouth daily. Patient uses this medication for her allergies.         Marland Kitchen CLONAZEPAM 0.5 MG PO TABS   Oral   Take 0.5 mg by mouth 2 (two) times daily as needed.         . DESVENLAFAXINE SUCCINATE ER 50 MG PO TB24   Oral   Take 100 mg by mouth daily.          Marland Kitchen  DOXYCYCLINE HYCLATE 100 MG PO CAPS   Oral   Take 1 capsule (100 mg total) by mouth 2 (two) times daily.   20 capsule   0   . NAPROXEN 375 MG PO TABS   Oral   Take 1 tablet (375 mg total) by mouth 2 (two) times daily.   20 tablet   0   . NAPROXEN 500 MG PO TABS   Oral   Take 1 tablet (500 mg total) by mouth 2 (two) times daily.   30 tablet   0   . OXYCODONE-ACETAMINOPHEN 5-325 MG PO TABS   Oral   Take 1 tablet by mouth every 4 (four) hours as needed for pain.   20 tablet   0   . TOPIRAMATE 25 MG PO TABS   Oral   Take 75 mg by mouth 2 (two) times daily.            BP 132/79  Pulse 97  Temp 98.2 F (36.8 C) (Oral)  Resp 20  SpO2 100%  LMP 12/31/2011  Physical Exam  Nursing note and vitals reviewed. Constitutional: She is oriented to person, place, and time. She appears well-developed and well-nourished.  HENT:  Head: Normocephalic and atraumatic.  Eyes: Conjunctivae normal are normal. Pupils are equal, round, and reactive to light.  Neck: Normal range of motion. Neck supple.  Cardiovascular: Normal  rate.   Pulmonary/Chest: Effort normal and breath sounds normal.  Abdominal: Soft.  Musculoskeletal: Normal range of motion.  Neurological: She is alert and oriented to person, place, and time. She has normal reflexes.  Skin: Skin is warm.  Psychiatric: She has a normal mood and affect.    ED Course  Procedures (including critical care time)  Labs Reviewed - No data to display Dg Chest 2 View  01/26/2012  *RADIOLOGY REPORT*  Clinical Data: Headache and cough  CHEST - 2 VIEW  Comparison: None.  Findings: Lungs are clear. No pleural effusion or pneumothorax. The cardiomediastinal contours are within normal limits. The visualized bones and soft tissues are without significant appreciable abnormality. Surgical clips right upper quadrant.  IMPRESSION: No radiographic evidence of acute cardiopulmonary process.   Original Report Authenticated By: Jearld Lesch, M.D.     Pt is on albuterol and tessalon pearls no fever, vitals normal.    Pt given albuterol no change in exam but decreased coughing.  Pt given torodol and phenergan.  I will start pt on prednisone and give hyrodone cough medication No diagnosis found.    MDM         Elson Areas, PA 01/26/12 [phone]

## 2012-01-26 NOTE — ED Provider Notes (Signed)
Medical screening examination/treatment/procedure(s) were performed by non-physician practitioner and as supervising physician I was immediately available for consultation/collaboration.  Raeford Razor, MD 01/26/12 2003

## 2012-01-26 NOTE — ED Notes (Signed)
Pt c/o non-productive cough since Thursday and migraine type HA since this am.

## 2012-01-26 NOTE — ED Notes (Signed)
Patient states she was seen last night and dx with bronchitis.  States today the coughing has increased and she feels sob. C/O productive cough with yellow secretions.

## 2012-01-26 NOTE — ED Notes (Signed)
Patient transported to X-ray 

## 2012-01-26 NOTE — ED Provider Notes (Signed)
History     CSN: [id number]  Arrival date & time 01/26/12  0151   First MD Initiated Contact with Patient 01/26/12 0209      Chief Complaint  Patient presents with  . Migraine    (Consider location/radiation/quality/duration/timing/severity/associated sxs/prior treatment) HPI This is a 31 year old female with a history of migraines. She has been coughing since the day before yesterday which she states triggered a migraine yesterday morning. The pain is on the right side of her head, throbbing and consistent with prior migraines. She describes it as severe. It has been associated with photophobia, nausea and vomiting. She's not had a fever. She has had no focal neurologic deficit.  Past Medical History  Diagnosis Date  . Migraine   . Depression   . Bipolar 1 disorder   . Otitis media   . Strep throat     Past Surgical History  Procedure Date  . Tonsillectomy   . Cholecystectomy   . Exploratory laparotomy   . Tubal ligation   . Exploratory laparotomy   . Tympanostomy     No family history on file.  History  Substance Use Topics  . Smoking status: Current Every Day Smoker -- 1.0 packs/day    Types: Cigarettes  . Smokeless tobacco: Never Used  . Alcohol Use: No    OB History    Grav Para Term Preterm Abortions TAB SAB Ect Mult Living                  Review of Systems  All other systems reviewed and are negative.    Allergies  Morphine and related and Reglan  Home Medications   Current Outpatient Rx  Name  Route  Sig  Dispense  Refill  . ANTIPYRINE-BENZOCAINE 5.4-1.4 % OT SOLN   Right Ear   Place 3 drops into the right ear 4 (four) times daily as needed for pain.   10 mL   0   . ARIPIPRAZOLE 5 MG PO TABS   Oral   Take 5 mg by mouth daily.          Marlin Canary HEADACHE PO   Oral   Take 1 packet by mouth once. Patient used this medication for pain.         Marland Kitchen CETIRIZINE HCL 10 MG PO TABS   Oral   Take 10 mg by mouth daily. Patient uses this  medication for her allergies.         Marland Kitchen CLONAZEPAM 0.5 MG PO TABS   Oral   Take 0.5 mg by mouth 2 (two) times daily as needed.         . DESVENLAFAXINE SUCCINATE ER 50 MG PO TB24   Oral   Take 100 mg by mouth daily.          Marland Kitchen DOXYCYCLINE HYCLATE 100 MG PO CAPS   Oral   Take 1 capsule (100 mg total) by mouth 2 (two) times daily.   20 capsule   0   . NAPROXEN 375 MG PO TABS   Oral   Take 1 tablet (375 mg total) by mouth 2 (two) times daily.   20 tablet   0   . NAPROXEN 500 MG PO TABS   Oral   Take 1 tablet (500 mg total) by mouth 2 (two) times daily.   30 tablet   0   . OXYCODONE-ACETAMINOPHEN 5-325 MG PO TABS   Oral   Take 1 tablet by mouth every 4 (four) hours as  needed for pain.   20 tablet   0   . TOPIRAMATE 25 MG PO TABS   Oral   Take 75 mg by mouth 2 (two) times daily.            BP 121/76  Pulse 103  Temp 98.7 F (37.1 C) (Oral)  Resp 18  Wt 180 lb (81.647 kg)  SpO2 99%  LMP 12/31/2011  Physical Exam General: Well-developed, well-nourished female in no acute distress; appearance consistent with age of record HENT: normocephalic, atraumatic Eyes: pupils equal round and reactive to light; extraocular muscles intact Neck: supple Heart: regular rate and rhythm; no murmurs, rubs or gallops Lungs: clear to auscultation bilaterally; shallow breaths with cough on deep breathing Abdomen: soft; nondistended; nontender; bowel sounds present Extremities: No deformity; full range of motion; pulses normal; no edema Neurologic: Awake, alert and oriented; motor function intact in all extremities and symmetric; no facial droop Skin: Warm and dry     ED Course  Procedures (including critical care time)     MDM  Nursing notes and vitals signs, including pulse oximetry, reviewed.  Summary of this visit's results, reviewed by myself:  Labs:  No results found for this or any previous visit (from the past 24 hour(s)).  Imaging Studies: Dg Chest 2  View  01/26/2012  *RADIOLOGY REPORT*  Clinical Data: Headache and cough  CHEST - 2 VIEW  Comparison: None.  Findings: Lungs are clear. No pleural effusion or pneumothorax. The cardiomediastinal contours are within normal limits. The visualized bones and soft tissues are without significant appreciable abnormality. Surgical clips right upper quadrant.  IMPRESSION: No radiographic evidence of acute cardiopulmonary process.   Original Report Authenticated By: Jearld Lesch, M.D.     3:26 AM Patient feeling better after IV fluids and medications.          Hanley Seamen, MD 01/26/12 [phone]

## 2012-02-06 ENCOUNTER — Emergency Department (HOSPITAL_BASED_OUTPATIENT_CLINIC_OR_DEPARTMENT_OTHER): Payer: Medicaid Other

## 2012-02-06 ENCOUNTER — Encounter (HOSPITAL_BASED_OUTPATIENT_CLINIC_OR_DEPARTMENT_OTHER): Payer: Self-pay

## 2012-02-06 ENCOUNTER — Emergency Department (HOSPITAL_BASED_OUTPATIENT_CLINIC_OR_DEPARTMENT_OTHER)
Admission: EM | Admit: 2012-02-06 | Discharge: 2012-02-06 | Disposition: A | Discharge status: Home / Self Care | Payer: Medicaid Other | Attending: Emergency Medicine | Admitting: Emergency Medicine

## 2012-02-06 DIAGNOSIS — J45909 Unspecified asthma, uncomplicated: Secondary | ICD-10-CM | POA: Insufficient documentation

## 2012-02-06 DIAGNOSIS — F17200 Nicotine dependence, unspecified, uncomplicated: Secondary | ICD-10-CM | POA: Insufficient documentation

## 2012-02-06 DIAGNOSIS — Z79899 Other long term (current) drug therapy: Secondary | ICD-10-CM | POA: Insufficient documentation

## 2012-02-06 DIAGNOSIS — Z9889 Other specified postprocedural states: Secondary | ICD-10-CM | POA: Insufficient documentation

## 2012-02-06 DIAGNOSIS — F319 Bipolar disorder, unspecified: Secondary | ICD-10-CM | POA: Insufficient documentation

## 2012-02-06 DIAGNOSIS — F3289 Other specified depressive episodes: Secondary | ICD-10-CM | POA: Insufficient documentation

## 2012-02-06 DIAGNOSIS — R109 Unspecified abdominal pain: Secondary | ICD-10-CM | POA: Insufficient documentation

## 2012-02-06 DIAGNOSIS — Z791 Long term (current) use of non-steroidal anti-inflammatories (NSAID): Secondary | ICD-10-CM | POA: Insufficient documentation

## 2012-02-06 DIAGNOSIS — Z3202 Encounter for pregnancy test, result negative: Secondary | ICD-10-CM | POA: Insufficient documentation

## 2012-02-06 DIAGNOSIS — F329 Major depressive disorder, single episode, unspecified: Secondary | ICD-10-CM | POA: Insufficient documentation

## 2012-02-06 DIAGNOSIS — G43909 Migraine, unspecified, not intractable, without status migrainosus: Secondary | ICD-10-CM | POA: Insufficient documentation

## 2012-02-06 LAB — URINALYSIS, ROUTINE W REFLEX MICROSCOPIC
Ketones, ur: NEGATIVE mg/dL
Leukocytes, UA: NEGATIVE
Nitrite: NEGATIVE
Protein, ur: NEGATIVE mg/dL
pH: 5.5 (ref 5.0–8.0)

## 2012-02-06 LAB — CBC WITH DIFFERENTIAL/PLATELET
Basophils Relative: 1 % (ref 0–1)
Eosinophils Absolute: 0.7 10*3/uL (ref 0.0–0.7)
MCH: 30.9 pg (ref 26.0–34.0)
MCHC: 35.3 g/dL (ref 30.0–36.0)
Neutrophils Relative %: 62 % (ref 43–77)
Platelets: 358 10*3/uL (ref 150–400)
RBC: 4.56 MIL/uL (ref 3.87–5.11)

## 2012-02-06 LAB — WET PREP, GENITAL

## 2012-02-06 LAB — COMPREHENSIVE METABOLIC PANEL
ALT: 48 U/L — ABNORMAL HIGH (ref 0–35)
AST: 22 U/L (ref 0–37)
Albumin: 3.9 g/dL (ref 3.5–5.2)
Alkaline Phosphatase: 77 U/L (ref 39–117)
Potassium: 3.2 mEq/L — ABNORMAL LOW (ref 3.5–5.1)
Sodium: 141 mEq/L (ref 135–145)
Total Protein: 7.4 g/dL (ref 6.0–8.3)

## 2012-02-06 MED ORDER — ONDANSETRON HCL 4 MG/2ML IJ SOLN
4.0000 mg | Freq: Once | INTRAMUSCULAR | Status: AC
  Administered 2012-02-06: 4 mg via INTRAVENOUS
  Filled 2012-02-06: qty 2

## 2012-02-06 MED ORDER — SODIUM CHLORIDE 0.9 % IV SOLN
Freq: Once | INTRAVENOUS | Status: AC
  Administered 2012-02-06: 17:00:00 via INTRAVENOUS

## 2012-02-06 MED ORDER — HYDROCODONE-ACETAMINOPHEN 5-325 MG PO TABS: 2.0000 | ORAL_TABLET | ORAL | Status: DC | PRN

## 2012-02-06 MED ORDER — IOHEXOL 300 MG/ML  SOLN
100.0000 mL | Freq: Once | INTRAMUSCULAR | Status: AC | PRN
  Administered 2012-02-06: 100 mL via INTRAVENOUS

## 2012-02-06 MED ORDER — HYDROMORPHONE HCL PF 1 MG/ML IJ SOLN
1.0000 mg | Freq: Once | INTRAMUSCULAR | Status: AC
  Administered 2012-02-06: 1 mg via INTRAVENOUS
  Filled 2012-02-06: qty 1

## 2012-02-06 MED ORDER — IOHEXOL 300 MG/ML  SOLN
50.0000 mL | Freq: Once | INTRAMUSCULAR | Status: AC | PRN
  Administered 2012-02-06: 50 mL via ORAL

## 2012-02-06 MED ORDER — PROMETHAZINE HCL 25 MG PO TABS: 25.0000 mg | ORAL_TABLET | Freq: Four times a day (QID) | ORAL | Status: DC | PRN

## 2012-02-06 NOTE — ED Notes (Signed)
Patient transported to CT via stretcher.

## 2012-02-06 NOTE — ED Notes (Signed)
Family at bedside. 

## 2012-02-06 NOTE — ED Provider Notes (Signed)
History     CSN: [id number]  Arrival date & time 02/06/12  1531   First MD Initiated Contact with Patient 02/06/12 1544      Chief Complaint  Patient presents with  . Abdominal Pain    (Consider location/radiation/quality/duration/timing/severity/associated sxs/prior treatment) Patient is a 31 y.o. female presenting with abdominal pain. The history is provided by the patient. No language interpreter was used.  Abdominal Pain The primary symptoms of the illness include abdominal pain. The current episode started 13 to 24 hours ago. The onset of the illness was sudden. The problem has been rapidly worsening.  Associated with: nothing. The patient states that she believes she is currently not pregnant. Change in bowel habit: one loose stool. Risk factors: hx of recent pid diagnosis  cultures were negative. Additional symptoms associated with the illness include back pain. Significant associated medical issues do not include GERD.   Pt complains of left lower abdominal pain.  Pt reports pain began this morning. Past Medical History  Diagnosis Date  . Migraine   . Depression   . Bipolar 1 disorder   . Otitis media   . Strep throat   . Asthma     Past Surgical History  Procedure Date  . Tonsillectomy   . Cholecystectomy   . Exploratory laparotomy   . Tubal ligation   . Exploratory laparotomy   . Tympanostomy     No family history on file.  History  Substance Use Topics  . Smoking status: Current Every Day Smoker -- 1.0 packs/day    Types: Cigarettes  . Smokeless tobacco: Never Used  . Alcohol Use: No    OB History    Grav Para Term Preterm Abortions TAB SAB Ect Mult Living                  Review of Systems  Gastrointestinal: Positive for abdominal pain.  Musculoskeletal: Positive for back pain.  All other systems reviewed and are negative.    Allergies  Morphine and related and Reglan  Home Medications   Current Outpatient Rx  Name  Route  Sig   Dispense  Refill  . ALBUTEROL SULFATE HFA 108 (90 BASE) MCG/ACT IN AERS   Inhalation   Inhale 2 puffs into the lungs every 6 (six) hours as needed.         . ANTIPYRINE-BENZOCAINE 5.4-1.4 % OT SOLN   Right Ear   Place 3 drops into the right ear 4 (four) times daily as needed for pain.   10 mL   0   . ARIPIPRAZOLE 5 MG PO TABS   Oral   Take 5 mg by mouth daily.          Marlin Canary HEADACHE PO   Oral   Take 1 packet by mouth once. Patient used this medication for pain.         Marland Kitchen BENZONATATE 200 MG PO CAPS   Oral   Take 1 capsule (200 mg total) by mouth 3 (three) times daily as needed for cough.   30 capsule   0   . CETIRIZINE HCL 10 MG PO TABS   Oral   Take 10 mg by mouth daily. Patient uses this medication for her allergies.         Marland Kitchen CLONAZEPAM 0.5 MG PO TABS   Oral   Take 0.5 mg by mouth 2 (two) times daily as needed.         . DESVENLAFAXINE SUCCINATE ER 50 MG PO  TB24   Oral   Take 100 mg by mouth daily.          Marland Kitchen DOXYCYCLINE HYCLATE 100 MG PO CAPS   Oral   Take 1 capsule (100 mg total) by mouth 2 (two) times daily.   20 capsule   0   . GUAIFENESIN-CODEINE 100-10 MG/5ML PO SOLN   Oral   Take 5 mLs by mouth 3 (three) times daily as needed for cough.   120 mL   0   . NAPROXEN 375 MG PO TABS   Oral   Take 1 tablet (375 mg total) by mouth 2 (two) times daily.   20 tablet   0   . NAPROXEN 500 MG PO TABS   Oral   Take 1 tablet (500 mg total) by mouth 2 (two) times daily.   30 tablet   0   . OXYCODONE-ACETAMINOPHEN 5-325 MG PO TABS   Oral   Take 1 tablet by mouth every 4 (four) hours as needed for pain.   20 tablet   0   . PREDNISONE 10 MG PO TABS      6,5,4,3,2,1 taper   21 tablet   0   . TOPIRAMATE 25 MG PO TABS   Oral   Take 75 mg by mouth 2 (two) times daily.            BP 121/87  Pulse 121  Temp 98.3 F (36.8 C) (Oral)  Resp 25  Ht 5\' 1"  (1.549 m)  Wt 180 lb (81.647 kg)  BMI 34.01 kg/m2  SpO2 99%  LMP  01/30/2012  Physical Exam  Nursing note and vitals reviewed. Constitutional: She is oriented to person, place, and time. She appears well-developed and well-nourished.  HENT:  Head: Normocephalic.  Right Ear: External ear normal.  Left Ear: External ear normal.  Nose: Nose normal.  Mouth/Throat: Oropharynx is clear and moist.  Eyes: Conjunctivae normal and EOM are normal. Pupils are equal, round, and reactive to light.  Neck: Normal range of motion. Neck supple.  Cardiovascular: Normal rate and normal heart sounds.   Pulmonary/Chest: Effort normal and breath sounds normal.  Abdominal: Soft. Bowel sounds are normal. There is tenderness.  Musculoskeletal: Normal range of motion.  Neurological: She is alert and oriented to person, place, and time. She has normal reflexes.  Skin: Skin is warm.  Psychiatric: She has a normal mood and affect.    ED Course  Procedures (including critical care time)  Labs Reviewed  URINALYSIS, ROUTINE W REFLEX MICROSCOPIC - Abnormal; Notable for the following:    APPearance CLOUDY (*)     All other components within normal limits  PREGNANCY, URINE   No results found.   No diagnosis found.    MDM   Results for orders placed during the hospital encounter of 02/06/12  URINALYSIS, ROUTINE W REFLEX MICROSCOPIC      Component Value Range   Color, Urine YELLOW  YELLOW   APPearance CLOUDY (*) CLEAR   Specific Gravity, Urine 1.019  1.005 - 1.030   pH 5.5  5.0 - 8.0   Glucose, UA NEGATIVE  NEGATIVE mg/dL   Hgb urine dipstick NEGATIVE  NEGATIVE   Bilirubin Urine NEGATIVE  NEGATIVE   Ketones, ur NEGATIVE  NEGATIVE mg/dL   Protein, ur NEGATIVE  NEGATIVE mg/dL   Urobilinogen, UA 0.2  0.0 - 1.0 mg/dL   Nitrite NEGATIVE  NEGATIVE   Leukocytes, UA NEGATIVE  NEGATIVE  PREGNANCY, URINE      Component  Value Range   Preg Test, Ur NEGATIVE  NEGATIVE  CBC WITH DIFFERENTIAL      Component Value Range   WBC 15.4 (*) 4.0 - 10.5 K/uL   RBC 4.56  3.87 - 5.11  MIL/uL   Hemoglobin 14.1  12.0 - 15.0 g/dL   HCT 16.1  09.6 - 04.5 %   MCV 87.7  78.0 - 100.0 fL   MCH 30.9  26.0 - 34.0 pg   MCHC 35.3  30.0 - 36.0 g/dL   RDW 40.9  81.1 - 91.4 %   Platelets 358  150 - 400 K/uL   Neutrophils Relative 62  43 - 77 %   Neutro Abs 9.5 (*) 1.7 - 7.7 K/uL   Lymphocytes Relative 25  12 - 46 %   Lymphs Abs 3.8  0.7 - 4.0 K/uL   Monocytes Relative 8  3 - 12 %   Monocytes Absolute 1.3 (*) 0.1 - 1.0 K/uL   Eosinophils Relative 5  0 - 5 %   Eosinophils Absolute 0.7  0.0 - 0.7 K/uL   Basophils Relative 1  0 - 1 %   Basophils Absolute 0.1  0.0 - 0.1 K/uL  COMPREHENSIVE METABOLIC PANEL      Component Value Range   Sodium 141  135 - 145 mEq/L   Potassium 3.2 (*) 3.5 - 5.1 mEq/L   Chloride 106  96 - 112 mEq/L   CO2 22  19 - 32 mEq/L   Glucose, Bld 96  70 - 99 mg/dL   BUN 11  6 - 23 mg/dL   Creatinine, Ser 7.82  0.50 - 1.10 mg/dL   Calcium 9.5  8.4 - 95.6 mg/dL   Total Protein 7.4  6.0 - 8.3 g/dL   Albumin 3.9  3.5 - 5.2 g/dL   AST 22  0 - 37 U/L   ALT 48 (*) 0 - 35 U/L   Alkaline Phosphatase 77  39 - 117 U/L   Total Bilirubin 0.2 (*) 0.3 - 1.2 mg/dL   GFR calc non Af Amer 66 (*) >90 mL/min   GFR calc Af Amer 76 (*) >90 mL/min  LIPASE, BLOOD      Component Value Range   Lipase 42  11 - 59 U/L  WET PREP, GENITAL      Component Value Range   Yeast Wet Prep HPF POC NONE SEEN  NONE SEEN   Trich, Wet Prep NONE SEEN  NONE SEEN   Clue Cells Wet Prep HPF POC FEW (*) NONE SEEN   WBC, Wet Prep HPF POC FEW (*) NONE SEEN   Dg Chest 2 View  01/26/2012  *RADIOLOGY REPORT*  Clinical Data: Headache and cough  CHEST - 2 VIEW  Comparison: None.  Findings: Lungs are clear. No pleural effusion or pneumothorax. The cardiomediastinal contours are within normal limits. The visualized bones and soft tissues are without significant appreciable abnormality. Surgical clips right upper quadrant.  IMPRESSION: No radiographic evidence of acute cardiopulmonary process.   Original  Report Authenticated By: Jearld Lesch, M.D.    Dg Abd 1 View  02/06/2012  *RADIOLOGY REPORT*  Clinical Data: Abdominal pain  ABDOMEN - 1 VIEW  Comparison: None.  Findings:  The bowel gas pattern is normal.  No obstruction or free air is seen on this supine examination.  There are clips in the pelvis.  There are phleboliths in the pelvis.  There are surgical clips in the gallbladder fossa region.  IMPRESSION: Postoperative changes.  Nonspecific gas  pattern.   Original Report Authenticated By: Bretta Bang, M.D.    Ct Abdomen Pelvis W Contrast  02/06/2012  *RADIOLOGY REPORT*  Clinical Data: Left lower quadrant pain.  Nausea.  CT ABDOMEN AND PELVIS WITH CONTRAST  Technique:  Multidetector CT imaging of the abdomen and pelvis was performed following the standard protocol during bolus administration of intravenous contrast.  Contrast: OMNIPAQUE IOHEXOL 300 MG/ML  SOLN  Comparison: None.  Findings: Surgical clips seen from prior cholecystectomy.  No evidence of biliary ductal dilatation.  The liver, pancreas, spleen, adrenal glands, and kidneys are normal in appearance.  No evidence of hydronephrosis.  No soft tissue masses or lymphadenopathy identified within the abdomen or pelvis.  Pelvic clips seen from previous bilateral tubal ligation.  Uterus and adnexa are otherwise unremarkable in appearance.  The normal appendix is visualized.  No evidence of inflammatory process or abnormal fluid collections.  No evidence of bowel wall thickening or dilatation.  IMPRESSION: Negative.  No acute findings or other significant abnormality identified.   Original Report Authenticated By: Myles Rosenthal, M.D.        Pt given IV fluids,  Pain medication and nausea medications,   Pt has elevated wbc's on cbc,   Ct obtained,  No abnormality.   Pt given rx for hydrocodone and phenergan.   I advised follow up with primary Md    Elson Areas, [place] 02/06/12 [address], [place] 02/06/12 2004

## 2012-02-06 NOTE — ED Notes (Signed)
Karen Sofia, PA-C at bedside 

## 2012-02-06 NOTE — ED Notes (Signed)
LLQ pain started this am-denies n/v/d,vaginal discgharge

## 2012-02-06 NOTE — ED Notes (Signed)
Patient transported to X-ray via stretcher 

## 2012-02-06 NOTE — ED Provider Notes (Signed)
Medical screening examination/treatment/procedure(s) were performed by non-physician practitioner and as supervising physician I was immediately available for consultation/collaboration.  Julienne Vogler, MD 02/06/12 2256 

## 2012-02-07 ENCOUNTER — Emergency Department (HOSPITAL_BASED_OUTPATIENT_CLINIC_OR_DEPARTMENT_OTHER)
Admission: EM | Admit: 2012-02-07 | Discharge: 2012-02-08 | Disposition: A | Discharge status: Home / Self Care | Payer: Medicaid Other | Attending: Emergency Medicine | Admitting: Emergency Medicine

## 2012-02-07 ENCOUNTER — Encounter (HOSPITAL_BASED_OUTPATIENT_CLINIC_OR_DEPARTMENT_OTHER): Payer: Self-pay | Admitting: *Deleted

## 2012-02-07 DIAGNOSIS — Z791 Long term (current) use of non-steroidal anti-inflammatories (NSAID): Secondary | ICD-10-CM | POA: Insufficient documentation

## 2012-02-07 DIAGNOSIS — F319 Bipolar disorder, unspecified: Secondary | ICD-10-CM | POA: Insufficient documentation

## 2012-02-07 DIAGNOSIS — J45909 Unspecified asthma, uncomplicated: Secondary | ICD-10-CM | POA: Insufficient documentation

## 2012-02-07 DIAGNOSIS — Z9889 Other specified postprocedural states: Secondary | ICD-10-CM | POA: Insufficient documentation

## 2012-02-07 DIAGNOSIS — Z79899 Other long term (current) drug therapy: Secondary | ICD-10-CM | POA: Insufficient documentation

## 2012-02-07 DIAGNOSIS — G43909 Migraine, unspecified, not intractable, without status migrainosus: Secondary | ICD-10-CM | POA: Insufficient documentation

## 2012-02-07 DIAGNOSIS — R112 Nausea with vomiting, unspecified: Secondary | ICD-10-CM | POA: Insufficient documentation

## 2012-02-07 DIAGNOSIS — F329 Major depressive disorder, single episode, unspecified: Secondary | ICD-10-CM | POA: Insufficient documentation

## 2012-02-07 DIAGNOSIS — F17200 Nicotine dependence, unspecified, uncomplicated: Secondary | ICD-10-CM | POA: Insufficient documentation

## 2012-02-07 DIAGNOSIS — F3289 Other specified depressive episodes: Secondary | ICD-10-CM | POA: Insufficient documentation

## 2012-02-07 LAB — GC/CHLAMYDIA PROBE AMP: GC Probe RNA: NEGATIVE

## 2012-02-07 NOTE — ED Notes (Signed)
Pt seen here yesterday for same , pt c/o phenergan not working

## 2012-02-08 MED ORDER — SODIUM CHLORIDE 0.9 % IV BOLUS (SEPSIS)
1000.0000 mL | Freq: Once | INTRAVENOUS | Status: AC
  Administered 2012-02-08: 1000 mL via INTRAVENOUS

## 2012-02-08 MED ORDER — ONDANSETRON HCL 4 MG/2ML IJ SOLN
4.0000 mg | Freq: Once | INTRAMUSCULAR | Status: AC
  Administered 2012-02-08: 4 mg via INTRAVENOUS
  Filled 2012-02-08: qty 2

## 2012-02-08 MED ORDER — ONDANSETRON 8 MG PO TBDP: 8.0000 mg | ORAL_TABLET | Freq: Three times a day (TID) | ORAL | Status: DC | PRN

## 2012-02-08 MED ORDER — FENTANYL CITRATE 0.05 MG/ML IJ SOLN
100.0000 ug | Freq: Once | INTRAMUSCULAR | Status: AC
  Administered 2012-02-08: 100 ug via INTRAVENOUS
  Filled 2012-02-08: qty 2

## 2012-02-08 NOTE — ED Provider Notes (Signed)
History     CSN: [id number]  Arrival date & time 02/07/12  2318   First MD Initiated Contact with Patient 02/08/12 0023      Chief Complaint  Patient presents with  . Vomiting    (Consider location/radiation/quality/duration/timing/severity/associated sxs/prior treatment) HPI This is a 31 year old female who was seen here 2 days ago for abdominal pain. It was accompanied by nausea. She was given Dilaudid and Zofran with improvement. A CT scan of the abdomen and pelvis was unremarkable and laboratory studies showed a leukocytosis. She is continued to be nauseated which is not been relieved by her prescribed Phenergan. She last took Phenergan 8 PM. She began vomiting at about 10 PM and has been vomiting persistently since. The vomiting has been severe and it exacerbates her abdominal pain which is in the left lower quadrant; she rates her pain an 8/10. She has had some mild diarrhea. She's complaining of dry mouth.  Past Medical History  Diagnosis Date  . Migraine   . Depression   . Bipolar 1 disorder   . Otitis media   . Strep throat   . Asthma     Past Surgical History  Procedure Date  . Tonsillectomy   . Cholecystectomy   . Exploratory laparotomy   . Tubal ligation   . Exploratory laparotomy   . Tympanostomy     History reviewed. No pertinent family history.  History  Substance Use Topics  . Smoking status: Current Every Day Smoker -- 1.0 packs/day    Types: Cigarettes  . Smokeless tobacco: Never Used  . Alcohol Use: No    OB History    Grav Para Term Preterm Abortions TAB SAB Ect Mult Living                  Review of Systems  All other systems reviewed and are negative.    Allergies  Morphine and related and Reglan  Home Medications   Current Outpatient Rx  Name  Route  Sig  Dispense  Refill  . ALBUTEROL SULFATE HFA 108 (90 BASE) MCG/ACT IN AERS   Inhalation   Inhale 2 puffs into the lungs every 6 (six) hours as needed.         .  ANTIPYRINE-BENZOCAINE 5.4-1.4 % OT SOLN   Right Ear   Place 3 drops into the right ear 4 (four) times daily as needed for pain.   10 mL   0   . ARIPIPRAZOLE 5 MG PO TABS   Oral   Take 5 mg by mouth daily.          Marlin Canary HEADACHE PO   Oral   Take 1 packet by mouth once. Patient used this medication for pain.         Marland Kitchen BENZONATATE 200 MG PO CAPS   Oral   Take 1 capsule (200 mg total) by mouth 3 (three) times daily as needed for cough.   30 capsule   0   . CETIRIZINE HCL 10 MG PO TABS   Oral   Take 10 mg by mouth daily. Patient uses this medication for her allergies.         Marland Kitchen CLONAZEPAM 0.5 MG PO TABS   Oral   Take 0.5 mg by mouth 2 (two) times daily as needed.         . DESVENLAFAXINE SUCCINATE ER 50 MG PO TB24   Oral   Take 100 mg by mouth daily.          Marland Kitchen  DOXYCYCLINE HYCLATE 100 MG PO CAPS   Oral   Take 1 capsule (100 mg total) by mouth 2 (two) times daily.   20 capsule   0   . GUAIFENESIN-CODEINE 100-10 MG/5ML PO SOLN   Oral   Take 5 mLs by mouth 3 (three) times daily as needed for cough.   120 mL   0   . HYDROCODONE-ACETAMINOPHEN 5-325 MG PO TABS   Oral   Take 2 tablets by mouth every 4 (four) hours as needed for pain.   10 tablet   0   . NAPROXEN 375 MG PO TABS   Oral   Take 1 tablet (375 mg total) by mouth 2 (two) times daily.   20 tablet   0   . NAPROXEN 500 MG PO TABS   Oral   Take 1 tablet (500 mg total) by mouth 2 (two) times daily.   30 tablet   0   . OXYCODONE-ACETAMINOPHEN 5-325 MG PO TABS   Oral   Take 1 tablet by mouth every 4 (four) hours as needed for pain.   20 tablet   0   . PREDNISONE 10 MG PO TABS      6,5,4,3,2,1 taper   21 tablet   0   . PROMETHAZINE HCL 25 MG PO TABS   Oral   Take 1 tablet (25 mg total) by mouth every 6 (six) hours as needed for nausea.   10 tablet   0   . TOPIRAMATE 25 MG PO TABS   Oral   Take 75 mg by mouth 2 (two) times daily.            BP 122/76  Pulse 96  Temp 99 F  (37.2 C) (Oral)  Resp 18  Ht 5\' 1"  (1.549 m)  Wt 180 lb (81.647 kg)  BMI 34.01 kg/m2  SpO2 99%  LMP 01/30/2012  Physical Exam General: Well-developed, well-nourished female in no acute distress; appearance consistent with age of record HENT: normocephalic, atraumatic; widespread dental decay; oral mucosae dry Eyes: pupils equal round and reactive to light; extraocular muscles intact Neck: supple Heart: regular rate and rhythm Lungs: clear to auscultation bilaterally Abdomen: soft; nondistended; left lower quadrant tenderness; no masses or hepatosplenomegaly; bowel sounds present Extremities: No deformity; full range of motion; pulses normal; no edema Neurologic: Awake, alert and oriented; motor function intact in all extremities and symmetric; no facial droop Skin: Warm and dry     ED Course  Procedures (including critical care time)     MDM  1:30 AM Symptoms abated with IV fluid bolus, IV Zofran and fentanyl.          Hanley Seamen, MD 02/08/12 0130

## 2012-02-25 ENCOUNTER — Encounter (HOSPITAL_BASED_OUTPATIENT_CLINIC_OR_DEPARTMENT_OTHER): Payer: Self-pay | Admitting: *Deleted

## 2012-02-25 ENCOUNTER — Emergency Department (HOSPITAL_BASED_OUTPATIENT_CLINIC_OR_DEPARTMENT_OTHER)
Admission: EM | Admit: 2012-02-25 | Discharge: 2012-02-25 | Disposition: A | Discharge status: Home / Self Care | Payer: Medicaid Other | Attending: Emergency Medicine | Admitting: Emergency Medicine

## 2012-02-25 DIAGNOSIS — Z87898 Personal history of other specified conditions: Secondary | ICD-10-CM | POA: Insufficient documentation

## 2012-02-25 DIAGNOSIS — Z8679 Personal history of other diseases of the circulatory system: Secondary | ICD-10-CM | POA: Insufficient documentation

## 2012-02-25 DIAGNOSIS — Z79899 Other long term (current) drug therapy: Secondary | ICD-10-CM | POA: Insufficient documentation

## 2012-02-25 DIAGNOSIS — H9209 Otalgia, unspecified ear: Secondary | ICD-10-CM

## 2012-02-25 DIAGNOSIS — F319 Bipolar disorder, unspecified: Secondary | ICD-10-CM | POA: Insufficient documentation

## 2012-02-25 DIAGNOSIS — Z9889 Other specified postprocedural states: Secondary | ICD-10-CM | POA: Insufficient documentation

## 2012-02-25 DIAGNOSIS — H9210 Otorrhea, unspecified ear: Secondary | ICD-10-CM | POA: Insufficient documentation

## 2012-02-25 DIAGNOSIS — F329 Major depressive disorder, single episode, unspecified: Secondary | ICD-10-CM | POA: Insufficient documentation

## 2012-02-25 DIAGNOSIS — J45909 Unspecified asthma, uncomplicated: Secondary | ICD-10-CM | POA: Insufficient documentation

## 2012-02-25 DIAGNOSIS — F17200 Nicotine dependence, unspecified, uncomplicated: Secondary | ICD-10-CM | POA: Insufficient documentation

## 2012-02-25 DIAGNOSIS — F3289 Other specified depressive episodes: Secondary | ICD-10-CM | POA: Insufficient documentation

## 2012-02-25 DIAGNOSIS — Z791 Long term (current) use of non-steroidal anti-inflammatories (NSAID): Secondary | ICD-10-CM | POA: Insufficient documentation

## 2012-02-25 MED ORDER — AMOXICILLIN 500 MG PO CAPS: 500.0000 mg | ORAL_CAPSULE | Freq: Three times a day (TID) | ORAL | Status: DC

## 2012-02-25 MED ORDER — IBUPROFEN 800 MG PO TABS
800.0000 mg | ORAL_TABLET | Freq: Once | ORAL | Status: AC
  Administered 2012-02-25: 800 mg via ORAL
  Filled 2012-02-25: qty 1

## 2012-02-25 MED ORDER — NAPROXEN 500 MG PO TABS: 500.0000 mg | ORAL_TABLET | Freq: Two times a day (BID) | ORAL | Status: DC

## 2012-02-25 NOTE — ED Provider Notes (Signed)
History     CSN: [id number]  Arrival date & time 02/25/12  1426   First MD Initiated Contact with Patient 02/25/12 1612      Chief Complaint  Patient presents with  . Otalgia    (Consider location/radiation/quality/duration/timing/severity/associated sxs/prior treatment) Patient is a 31 y.o. female presenting with ear pain. The history is provided by the patient. No language interpreter was used.  Otalgia This is a new problem. The current episode started 6 to 12 hours ago. There is pain in the right ear. The problem occurs constantly. The problem has been gradually worsening. There has been no fever. The pain is at a severity of 7/10. The pain is moderate. Associated symptoms include ear discharge. Her past medical history is significant for chronic ear infection.  Pt complains of pain in her right ear.  Pt reports she had drainage at home.  (Pt has had multiple ear infections)  Past Medical History  Diagnosis Date  . Migraine   . Depression   . Bipolar 1 disorder   . Otitis media   . Strep throat   . Asthma     Past Surgical History  Procedure Date  . Tonsillectomy   . Cholecystectomy   . Exploratory laparotomy   . Tubal ligation   . Exploratory laparotomy   . Tympanostomy     History reviewed. No pertinent family history.  History  Substance Use Topics  . Smoking status: Current Every Day Smoker -- 1.0 packs/day    Types: Cigarettes  . Smokeless tobacco: Never Used  . Alcohol Use: No    OB History    Grav Para Term Preterm Abortions TAB SAB Ect Mult Living                  Review of Systems  HENT: Positive for ear pain and ear discharge.   All other systems reviewed and are negative.    Allergies  Morphine and related and Reglan  Home Medications   Current Outpatient Rx  Name  Route  Sig  Dispense  Refill  . ALBUTEROL SULFATE HFA 108 (90 BASE) MCG/ACT IN AERS   Inhalation   Inhale 2 puffs into the lungs every 6 (six) hours as needed.          . AMOXICILLIN 500 MG PO CAPS   Oral   Take 1 capsule (500 mg total) by mouth 3 (three) times daily.   30 capsule   0   . ANTIPYRINE-BENZOCAINE 5.4-1.4 % OT SOLN   Right Ear   Place 3 drops into the right ear 4 (four) times daily as needed for pain.   10 mL   0   . ARIPIPRAZOLE 5 MG PO TABS   Oral   Take 5 mg by mouth daily.          Marlin Canary HEADACHE PO   Oral   Take 1 packet by mouth once. Patient used this medication for pain.         Marland Kitchen BENZONATATE 200 MG PO CAPS   Oral   Take 1 capsule (200 mg total) by mouth 3 (three) times daily as needed for cough.   30 capsule   0   . CETIRIZINE HCL 10 MG PO TABS   Oral   Take 10 mg by mouth daily. Patient uses this medication for her allergies.         Marland Kitchen CLONAZEPAM 0.5 MG PO TABS   Oral   Take 0.5 mg by mouth 2 (  two) times daily as needed.         . DESVENLAFAXINE SUCCINATE ER 50 MG PO TB24   Oral   Take 100 mg by mouth daily.          Marland Kitchen DOXYCYCLINE HYCLATE 100 MG PO CAPS   Oral   Take 1 capsule (100 mg total) by mouth 2 (two) times daily.   20 capsule   0   . GUAIFENESIN-CODEINE 100-10 MG/5ML PO SOLN   Oral   Take 5 mLs by mouth 3 (three) times daily as needed for cough.   120 mL   0   . HYDROCODONE-ACETAMINOPHEN 5-325 MG PO TABS   Oral   Take 2 tablets by mouth every 4 (four) hours as needed for pain.   10 tablet   0   . NAPROXEN 375 MG PO TABS   Oral   Take 1 tablet (375 mg total) by mouth 2 (two) times daily.   20 tablet   0   . NAPROXEN 500 MG PO TABS   Oral   Take 1 tablet (500 mg total) by mouth 2 (two) times daily.   20 tablet   0   . OXYCODONE-ACETAMINOPHEN 5-325 MG PO TABS   Oral   Take 1 tablet by mouth every 4 (four) hours as needed for pain.   20 tablet   0   . PREDNISONE 10 MG PO TABS      6,5,4,3,2,1 taper   21 tablet   0   . TOPIRAMATE 25 MG PO TABS   Oral   Take 75 mg by mouth 2 (two) times daily.            BP 135/76  Pulse 94  Temp 98.3 F (36.8 C)  (Oral)  Resp 20  Ht 5\' 1"  (1.549 m)  Wt 170 lb (77.111 kg)  BMI 32.12 kg/m2  SpO2 100%  LMP 01/30/2012  Physical Exam  Nursing note and vitals reviewed. Constitutional: She is oriented to person, place, and time. She appears well-developed and well-nourished.  HENT:  Head: Normocephalic and atraumatic.  Right Ear: External ear normal.  Left Ear: External ear normal.       Right tm dull,    Eyes: Conjunctivae normal and EOM are normal. Pupils are equal, round, and reactive to light.  Neck: Normal range of motion. Neck supple.  Cardiovascular: Normal rate, normal heart sounds and intact distal pulses.   Pulmonary/Chest: Effort normal and breath sounds normal.  Abdominal: Soft.  Musculoskeletal: Normal range of motion.  Neurological: She is alert and oriented to person, place, and time.  Skin: Skin is warm.  Psychiatric: She has a normal mood and affect.    ED Course  Procedures (including critical care time)  Labs Reviewed - No data to display No results found.   1. Ear pain       MDM  I will treat with antibiotic,   Pt advised to see her primary MD for recheck and referral to ENT for evaluation        Elson Areas, [place] 02/25/12 1656

## 2012-02-25 NOTE — ED Notes (Signed)
Pt c/o right ear pain since 7a. Used "numbing drops" and took a Advertising account executive without relief. Clr fluid draining from ear at 2p. "Feels clogged" Sounds muffled.

## 2012-02-27 NOTE — ED Provider Notes (Signed)
Medical screening examination/treatment/procedure(s) were performed by non-physician practitioner and as supervising physician I was immediately available for consultation/collaboration.   Treyshawn Muldrew, MD 02/27/12 0702 

## 2012-03-01 ENCOUNTER — Encounter (HOSPITAL_BASED_OUTPATIENT_CLINIC_OR_DEPARTMENT_OTHER): Payer: Self-pay | Admitting: *Deleted

## 2012-03-01 ENCOUNTER — Emergency Department (HOSPITAL_BASED_OUTPATIENT_CLINIC_OR_DEPARTMENT_OTHER)
Admission: EM | Admit: 2012-03-01 | Discharge: 2012-03-01 | Disposition: A | Discharge status: Home / Self Care | Payer: Medicaid Other | Attending: Emergency Medicine | Admitting: Emergency Medicine

## 2012-03-01 DIAGNOSIS — R51 Headache: Secondary | ICD-10-CM | POA: Insufficient documentation

## 2012-03-01 DIAGNOSIS — F329 Major depressive disorder, single episode, unspecified: Secondary | ICD-10-CM | POA: Insufficient documentation

## 2012-03-01 DIAGNOSIS — F17200 Nicotine dependence, unspecified, uncomplicated: Secondary | ICD-10-CM | POA: Insufficient documentation

## 2012-03-01 DIAGNOSIS — F3160 Bipolar disorder, current episode mixed, unspecified: Secondary | ICD-10-CM | POA: Insufficient documentation

## 2012-03-01 DIAGNOSIS — G43909 Migraine, unspecified, not intractable, without status migrainosus: Secondary | ICD-10-CM | POA: Insufficient documentation

## 2012-03-01 DIAGNOSIS — J45909 Unspecified asthma, uncomplicated: Secondary | ICD-10-CM | POA: Insufficient documentation

## 2012-03-01 DIAGNOSIS — Z8669 Personal history of other diseases of the nervous system and sense organs: Secondary | ICD-10-CM | POA: Insufficient documentation

## 2012-03-01 DIAGNOSIS — F3289 Other specified depressive episodes: Secondary | ICD-10-CM | POA: Insufficient documentation

## 2012-03-01 DIAGNOSIS — Z8709 Personal history of other diseases of the respiratory system: Secondary | ICD-10-CM | POA: Insufficient documentation

## 2012-03-01 LAB — PREGNANCY, URINE: Preg Test, Ur: NEGATIVE

## 2012-03-01 MED ORDER — PROMETHAZINE HCL 25 MG/ML IJ SOLN: 12.5000 mg | Freq: Once | INTRAMUSCULAR | Status: DC

## 2012-03-01 MED ORDER — KETOROLAC TROMETHAMINE 30 MG/ML IJ SOLN: 30.0000 mg | Freq: Once | INTRAMUSCULAR | Status: DC

## 2012-03-01 MED ORDER — DEXAMETHASONE SODIUM PHOSPHATE 10 MG/ML IJ SOLN
10.0000 mg | Freq: Once | INTRAMUSCULAR | Status: AC
  Administered 2012-03-01: 10 mg via INTRAVENOUS
  Filled 2012-03-01: qty 1

## 2012-03-01 MED ORDER — DIPHENHYDRAMINE HCL 50 MG/ML IJ SOLN
12.5000 mg | Freq: Once | INTRAMUSCULAR | Status: AC
  Administered 2012-03-01: 12.5 mg via INTRAVENOUS
  Filled 2012-03-01: qty 1

## 2012-03-01 NOTE — ED Provider Notes (Signed)
History     CSN: [id number]  Arrival date & time 03/01/12  0134   First MD Initiated Contact with Patient 03/01/12 0321      Chief Complaint  Patient presents with  . Migraine    (Consider location/radiation/quality/duration/timing/severity/associated sxs/prior treatment) Patient is a 31 y.o. female presenting with migraines. The history is provided by the patient. No language interpreter was used.  Migraine This is a recurrent problem. The current episode started 3 to 5 hours ago. The problem occurs constantly. The problem has not changed since onset.Associated symptoms include headaches. Pertinent negatives include no chest pain, no abdominal pain and no shortness of breath. Nothing aggravates the symptoms. Nothing relieves the symptoms. She has tried nothing for the symptoms. The treatment provided no relief.    Past Medical History  Diagnosis Date  . Migraine   . Depression   . Bipolar 1 disorder   . Otitis media   . Strep throat   . Asthma     Past Surgical History  Procedure Date  . Tonsillectomy   . Cholecystectomy   . Exploratory laparotomy   . Tubal ligation   . Exploratory laparotomy   . Tympanostomy     History reviewed. No pertinent family history.  History  Substance Use Topics  . Smoking status: Current Every Day Smoker -- 1.0 packs/day    Types: Cigarettes  . Smokeless tobacco: Never Used  . Alcohol Use: No    OB History    Grav Para Term Preterm Abortions TAB SAB Ect Mult Living                  Review of Systems  Constitutional: Negative for fever.  HENT: Negative for neck stiffness.   Respiratory: Negative for shortness of breath.   Cardiovascular: Negative for chest pain.  Gastrointestinal: Negative for abdominal pain.  Neurological: Positive for headaches. Negative for facial asymmetry, speech difficulty, weakness and numbness.  All other systems reviewed and are negative.    Allergies  Morphine and related and Reglan  Home  Medications   Current Outpatient Rx  Name  Route  Sig  Dispense  Refill  . AMOXICILLIN 500 MG PO CAPS   Oral   Take 1 capsule (500 mg total) by mouth 3 (three) times daily.   30 capsule   0   . ARIPIPRAZOLE 5 MG PO TABS   Oral   Take 5 mg by mouth daily.          Marlin Canary HEADACHE PO   Oral   Take 1 packet by mouth once. Patient used this medication for pain.         Marland Kitchen CETIRIZINE HCL 10 MG PO TABS   Oral   Take 10 mg by mouth daily. Patient uses this medication for her allergies.         Marland Kitchen CLONAZEPAM 0.5 MG PO TABS   Oral   Take 0.5 mg by mouth 2 (two) times daily as needed.         . DESVENLAFAXINE SUCCINATE ER 50 MG PO TB24   Oral   Take 100 mg by mouth daily.          Marland Kitchen KETOROLAC TROMETHAMINE 10 MG PO TABS   Oral   Take 10 mg by mouth every 6 (six) hours as needed.         Marland Kitchen PROMETHAZINE HCL 25 MG PO TABS   Oral   Take 25 mg by mouth every 6 (six) hours as needed.         Marland Kitchen  TOPIRAMATE 25 MG PO TABS   Oral   Take 75 mg by mouth 2 (two) times daily.          . ALBUTEROL SULFATE HFA 108 (90 BASE) MCG/ACT IN AERS   Inhalation   Inhale 2 puffs into the lungs every 6 (six) hours as needed.         . ANTIPYRINE-BENZOCAINE 5.4-1.4 % OT SOLN   Right Ear   Place 3 drops into the right ear 4 (four) times daily as needed for pain.   10 mL   0   . BENZONATATE 200 MG PO CAPS   Oral   Take 1 capsule (200 mg total) by mouth 3 (three) times daily as needed for cough.   30 capsule   0   . DOXYCYCLINE HYCLATE 100 MG PO CAPS   Oral   Take 1 capsule (100 mg total) by mouth 2 (two) times daily.   20 capsule   0   . GUAIFENESIN-CODEINE 100-10 MG/5ML PO SOLN   Oral   Take 5 mLs by mouth 3 (three) times daily as needed for cough.   120 mL   0   . HYDROCODONE-ACETAMINOPHEN 5-325 MG PO TABS   Oral   Take 2 tablets by mouth every 4 (four) hours as needed for pain.   10 tablet   0   . NAPROXEN 375 MG PO TABS   Oral   Take 1 tablet (375 mg total) by  mouth 2 (two) times daily.   20 tablet   0   . NAPROXEN 500 MG PO TABS   Oral   Take 1 tablet (500 mg total) by mouth 2 (two) times daily.   20 tablet   0   . OXYCODONE-ACETAMINOPHEN 5-325 MG PO TABS   Oral   Take 1 tablet by mouth every 4 (four) hours as needed for pain.   20 tablet   0   . PREDNISONE 10 MG PO TABS      6,5,4,3,2,1 taper   21 tablet   0     BP 120/78  Pulse 73  Temp 97.9 F (36.6 C) (Oral)  Resp 18  Ht 5\' 1"  (1.549 m)  Wt 170 lb (77.111 kg)  BMI 32.12 kg/m2  SpO2 100%  LMP 02/27/2012  Physical Exam  Constitutional: She is oriented to person, place, and time. She appears well-developed and well-nourished. No distress.  HENT:  Head: Normocephalic and atraumatic.  Mouth/Throat: Oropharynx is clear and moist.  Eyes: Conjunctivae normal and EOM are normal. Pupils are equal, round, and reactive to light.  Neck: Normal range of motion. Neck supple.  Cardiovascular: Normal rate, regular rhythm and intact distal pulses.   Pulmonary/Chest: Effort normal and breath sounds normal. She has no wheezes. She has no rales.  Abdominal: Soft. Bowel sounds are normal. There is no tenderness.  Musculoskeletal: Normal range of motion.  Lymphadenopathy:    She has no cervical adenopathy.  Neurological: She is alert and oriented to person, place, and time. She has normal reflexes. No cranial nerve deficit.  Skin: Skin is warm and dry.    ED Course  Procedures (including critical care time)   Labs Reviewed  PREGNANCY, URINE   No results found.   1. Migraine       MDM  Improved post meds.  Exam and vitals reassuring no indication for imaging at this time.  Return for worsening symptoms       Aysen Shieh K Franny Selvage-Rasch, MD 03/01/12 [phone]

## 2012-03-01 NOTE — ED Notes (Signed)
C/o migraine headache since yesterday, no relief from toradol or phenergan at home. Was seen here last month for same and did not follow up with neurologist.

## 2012-03-01 NOTE — Discharge Instructions (Signed)
Migraine Headache °A migraine headache is very bad, throbbing pain on one or both sides of your head. Talk to your doctor about what things may bring on (trigger) your migraine headaches. °HOME CARE °· Only take medicines as told by your doctor. °· Lie down in a dark, quiet room when you have a migraine. °· Keep a journal to find out if certain things bring on migraine headaches. For example, write down: °· What you eat and drink. °· How much sleep you get. °· Any change to your diet or medicines. °· Lessen how much alcohol you drink. °· Quit smoking if you smoke. °· Get enough sleep. °· Lessen any stress in your life. °· Keep lights dim if bright lights bother you or make your migraines worse. °GET HELP RIGHT AWAY IF:  °· Your migraine becomes really bad. °· You have a fever. °· You have a stiff neck. °· You have trouble seeing. °· Your muscles are weak, or you lose muscle control. °· You lose your balance or have trouble walking. °· You feel like you will pass out (faint), or you pass out. °· You have really bad symptoms that are different than your first symptoms. °MAKE SURE YOU:  °· Understand these instructions. °· Will watch your condition. °· Will get help right away if you are not doing well or get worse. °Document Released: 11/30/2007 Document Revised: 05/15/2011 Document Reviewed: 02/10/2011 °[employer]® Patient Information ©2013 [employer]. ° °

## 2012-03-13 ENCOUNTER — Encounter (HOSPITAL_BASED_OUTPATIENT_CLINIC_OR_DEPARTMENT_OTHER): Payer: Self-pay | Admitting: Family Medicine

## 2012-03-13 ENCOUNTER — Emergency Department (HOSPITAL_BASED_OUTPATIENT_CLINIC_OR_DEPARTMENT_OTHER)
Admission: EM | Admit: 2012-03-13 | Discharge: 2012-03-13 | Disposition: A | Discharge status: Home / Self Care | Payer: Medicaid Other | Attending: Emergency Medicine | Admitting: Emergency Medicine

## 2012-03-13 DIAGNOSIS — Z9089 Acquired absence of other organs: Secondary | ICD-10-CM | POA: Insufficient documentation

## 2012-03-13 DIAGNOSIS — F319 Bipolar disorder, unspecified: Secondary | ICD-10-CM | POA: Insufficient documentation

## 2012-03-13 DIAGNOSIS — D72829 Elevated white blood cell count, unspecified: Secondary | ICD-10-CM | POA: Insufficient documentation

## 2012-03-13 DIAGNOSIS — Z8742 Personal history of other diseases of the female genital tract: Secondary | ICD-10-CM | POA: Insufficient documentation

## 2012-03-13 DIAGNOSIS — F3289 Other specified depressive episodes: Secondary | ICD-10-CM | POA: Insufficient documentation

## 2012-03-13 DIAGNOSIS — F329 Major depressive disorder, single episode, unspecified: Secondary | ICD-10-CM | POA: Insufficient documentation

## 2012-03-13 DIAGNOSIS — N949 Unspecified condition associated with female genital organs and menstrual cycle: Secondary | ICD-10-CM | POA: Insufficient documentation

## 2012-03-13 DIAGNOSIS — J45909 Unspecified asthma, uncomplicated: Secondary | ICD-10-CM | POA: Insufficient documentation

## 2012-03-13 DIAGNOSIS — Z8619 Personal history of other infectious and parasitic diseases: Secondary | ICD-10-CM | POA: Insufficient documentation

## 2012-03-13 DIAGNOSIS — Z79899 Other long term (current) drug therapy: Secondary | ICD-10-CM | POA: Insufficient documentation

## 2012-03-13 DIAGNOSIS — Z3202 Encounter for pregnancy test, result negative: Secondary | ICD-10-CM | POA: Insufficient documentation

## 2012-03-13 DIAGNOSIS — F17200 Nicotine dependence, unspecified, uncomplicated: Secondary | ICD-10-CM | POA: Insufficient documentation

## 2012-03-13 DIAGNOSIS — Z8669 Personal history of other diseases of the nervous system and sense organs: Secondary | ICD-10-CM | POA: Insufficient documentation

## 2012-03-13 DIAGNOSIS — Z8679 Personal history of other diseases of the circulatory system: Secondary | ICD-10-CM | POA: Insufficient documentation

## 2012-03-13 DIAGNOSIS — Z9851 Tubal ligation status: Secondary | ICD-10-CM | POA: Insufficient documentation

## 2012-03-13 DIAGNOSIS — R102 Pelvic and perineal pain: Secondary | ICD-10-CM

## 2012-03-13 DIAGNOSIS — IMO0002 Reserved for concepts with insufficient information to code with codable children: Secondary | ICD-10-CM | POA: Insufficient documentation

## 2012-03-13 LAB — LIPASE, BLOOD: Lipase: 30 U/L (ref 11–59)

## 2012-03-13 LAB — CBC WITH DIFFERENTIAL/PLATELET
Eosinophils Absolute: 0.6 10*3/uL (ref 0.0–0.7)
Eosinophils Relative: 5 % (ref 0–5)
HCT: 43.2 % (ref 36.0–46.0)
Lymphocytes Relative: 19 % (ref 12–46)
Lymphs Abs: 2.4 10*3/uL (ref 0.7–4.0)
MCH: 30.9 pg (ref 26.0–34.0)
MCV: 89.6 fL (ref 78.0–100.0)
Monocytes Absolute: 1 10*3/uL (ref 0.1–1.0)
RBC: 4.82 MIL/uL (ref 3.87–5.11)
WBC: 12.5 10*3/uL — ABNORMAL HIGH (ref 4.0–10.5)

## 2012-03-13 LAB — COMPREHENSIVE METABOLIC PANEL
BUN: 12 mg/dL (ref 6–23)
CO2: 21 mEq/L (ref 19–32)
Calcium: 10.1 mg/dL (ref 8.4–10.5)
Creatinine, Ser: 1.1 mg/dL (ref 0.50–1.10)
GFR calc Af Amer: 76 mL/min — ABNORMAL LOW (ref >90)
GFR calc non Af Amer: 66 mL/min — ABNORMAL LOW (ref >90)
Glucose, Bld: 102 mg/dL — ABNORMAL HIGH (ref 70–99)

## 2012-03-13 LAB — WET PREP, GENITAL: Trich, Wet Prep: NONE SEEN

## 2012-03-13 LAB — RPR: RPR Ser Ql: NONREACTIVE

## 2012-03-13 LAB — URINALYSIS, ROUTINE W REFLEX MICROSCOPIC
Bilirubin Urine: NEGATIVE
Hgb urine dipstick: NEGATIVE
Ketones, ur: NEGATIVE mg/dL
Specific Gravity, Urine: 1.011 (ref 1.005–1.030)
Urobilinogen, UA: 0.2 mg/dL (ref 0.0–1.0)
pH: 6 (ref 5.0–8.0)

## 2012-03-13 MED ORDER — CEFTRIAXONE SODIUM 1 G IJ SOLR
1.0000 g | INTRAMUSCULAR | Status: DC
  Administered 2012-03-13: 1 g via INTRAVENOUS
  Filled 2012-03-13: qty 10

## 2012-03-13 MED ORDER — ONDANSETRON HCL 4 MG/2ML IJ SOLN
4.0000 mg | Freq: Once | INTRAMUSCULAR | Status: AC
  Administered 2012-03-13: 4 mg via INTRAVENOUS
  Filled 2012-03-13: qty 2

## 2012-03-13 MED ORDER — DOXYCYCLINE HYCLATE 100 MG PO CAPS: 100.0000 mg | ORAL_CAPSULE | Freq: Two times a day (BID) | ORAL | Status: DC

## 2012-03-13 MED ORDER — SODIUM CHLORIDE 0.9 % IV SOLN
Freq: Once | INTRAVENOUS | Status: AC
  Administered 2012-03-13: 09:00:00 via INTRAVENOUS

## 2012-03-13 MED ORDER — OXYCODONE-ACETAMINOPHEN 5-325 MG PO TABS: 1.0000 | ORAL_TABLET | ORAL | Status: DC | PRN

## 2012-03-13 MED ORDER — DOXYCYCLINE HYCLATE 100 MG PO TABS
100.0000 mg | ORAL_TABLET | Freq: Once | ORAL | Status: AC
  Administered 2012-03-13: 100 mg via ORAL
  Filled 2012-03-13: qty 1

## 2012-03-13 MED ORDER — HYDROMORPHONE HCL PF 1 MG/ML IJ SOLN
1.0000 mg | Freq: Once | INTRAMUSCULAR | Status: AC
  Administered 2012-03-13: 1 mg via INTRAVENOUS
  Filled 2012-03-13: qty 1

## 2012-03-13 NOTE — ED Notes (Signed)
Pt c/o lower abdomen pain sharp in nature since 1am. Pt denies n/v/d. Pt reports last bowel movement "normal" and this morning. Pt sts she was recently treated for BV.

## 2012-03-13 NOTE — ED Provider Notes (Signed)
History     CSN: [id number]  Arrival date & time 03/13/12  1191   First MD Initiated Contact with Patient 03/13/12 [phone]      Chief Complaint  Patient presents with  . Abdominal Pain    (Consider location/radiation/quality/duration/timing/severity/associated sxs/prior treatment) HPI 32 year old female comes in with onset at 01 100 of severe suprapubic pain. Pain does not radiate and she describes it as sharp. Nothing makes it better nothing makes it worse. There is no associated nausea, vomiting, diarrhea. There is no associated dysuria or vaginal discharge. She has had similar pains in the past with episodes of PID. She tried taking acetaminophen with no relief. She states that she has been treated for a pelvic infection several times but that her sexual partner has not been treated. She is status post tubal ligation. Past Medical History  Diagnosis Date  . Migraine   . Depression   . Bipolar 1 disorder   . Otitis media   . Strep throat   . Asthma     Past Surgical History  Procedure Date  . Tonsillectomy   . Cholecystectomy   . Exploratory laparotomy   . Tubal ligation   . Exploratory laparotomy   . Tympanostomy     No family history on file.  History  Substance Use Topics  . Smoking status: Current Every Day Smoker -- 1.0 packs/day    Types: Cigarettes  . Smokeless tobacco: Never Used  . Alcohol Use: No    OB History    Grav Para Term Preterm Abortions TAB SAB Ect Mult Living                  Review of Systems  Allergies  Morphine and related and Reglan  Home Medications   Current Outpatient Rx  Name  Route  Sig  Dispense  Refill  . ALBUTEROL SULFATE HFA 108 (90 BASE) MCG/ACT IN AERS   Inhalation   Inhale 2 puffs into the lungs every 6 (six) hours as needed.         . AMOXICILLIN 500 MG PO CAPS   Oral   Take 1 capsule (500 mg total) by mouth 3 (three) times daily.   30 capsule   0   . ANTIPYRINE-BENZOCAINE 5.4-1.4 % OT SOLN   Right Ear  Place 3 drops into the right ear 4 (four) times daily as needed for pain.   10 mL   0   . ARIPIPRAZOLE 5 MG PO TABS   Oral   Take 5 mg by mouth daily.          Marlin Canary HEADACHE PO   Oral   Take 1 packet by mouth once. Patient used this medication for pain.         Marland Kitchen BENZONATATE 200 MG PO CAPS   Oral   Take 1 capsule (200 mg total) by mouth 3 (three) times daily as needed for cough.   30 capsule   0   . CETIRIZINE HCL 10 MG PO TABS   Oral   Take 10 mg by mouth daily. Patient uses this medication for her allergies.         Marland Kitchen CLONAZEPAM 0.5 MG PO TABS   Oral   Take 0.5 mg by mouth 2 (two) times daily as needed.         . DESVENLAFAXINE SUCCINATE ER 50 MG PO TB24   Oral   Take 100 mg by mouth daily.          Marland Kitchen  DOXYCYCLINE HYCLATE 100 MG PO CAPS   Oral   Take 1 capsule (100 mg total) by mouth 2 (two) times daily.   20 capsule   0   . GUAIFENESIN-CODEINE 100-10 MG/5ML PO SOLN   Oral   Take 5 mLs by mouth 3 (three) times daily as needed for cough.   120 mL   0   . HYDROCODONE-ACETAMINOPHEN 5-325 MG PO TABS   Oral   Take 2 tablets by mouth every 4 (four) hours as needed for pain.   10 tablet   0   . KETOROLAC TROMETHAMINE 10 MG PO TABS   Oral   Take 10 mg by mouth every 6 (six) hours as needed.         Marland Kitchen NAPROXEN 375 MG PO TABS   Oral   Take 1 tablet (375 mg total) by mouth 2 (two) times daily.   20 tablet   0   . NAPROXEN 500 MG PO TABS   Oral   Take 1 tablet (500 mg total) by mouth 2 (two) times daily.   20 tablet   0   . OXYCODONE-ACETAMINOPHEN 5-325 MG PO TABS   Oral   Take 1 tablet by mouth every 4 (four) hours as needed for pain.   20 tablet   0   . PREDNISONE 10 MG PO TABS      6,5,4,3,2,1 taper   21 tablet   0   . PROMETHAZINE HCL 25 MG PO TABS   Oral   Take 25 mg by mouth every 6 (six) hours as needed.         . TOPIRAMATE 25 MG PO TABS   Oral   Take 75 mg by mouth 2 (two) times daily.            BP 122/74  Pulse  99  Temp 98.7 F (37.1 C) (Oral)  Resp 20  Ht 5\' 1"  (1.549 m)  Wt 170 lb (77.111 kg)  BMI 32.12 kg/m2  SpO2 100%  LMP 02/27/2012  Physical Exam 32 year old female, who is sitting on her knees in the bed crying, but is in no acute distress. Vital signs are normal. Oxygen saturation is 100%, which is normal. Head is normocephalic and atraumatic. PERRLA, EOMI. Oropharynx is clear. Neck is nontender and supple without adenopathy or JVD. Back is nontender and there is no CVA tenderness. Lungs are clear without rales, wheezes, or rhonchi. Chest is nontender. Heart has regular rate and rhythm without murmur. Abdomen is soft, flat, with tenderness in the suprapubic area but no other areas of tenderness. There is no rebound or guarding. There are no masses or hepatosplenomegaly and peristalsis is hypoactive. Pelvic: Normal external female genitalia. Mild to moderate white vaginal discharge present. Cervix is nontender with no cervical motion tenderness. No localized right adnexal tenderness is noted without adnexal masses. Uterine fundus is nontender without focal area of tenderness. Extremities have no cyanosis or edema, full range of motion is present. Skin is warm and dry without rash. Neurologic: Mental status is normal, cranial nerves are intact, there are no motor or sensory deficits.  ED Course  Procedures (including critical care time)  Results for orders placed during the hospital encounter of 03/13/12  URINALYSIS, ROUTINE W REFLEX MICROSCOPIC      Component Value Range   Color, Urine YELLOW  YELLOW   APPearance CLEAR  CLEAR   Specific Gravity, Urine 1.011  1.005 - 1.030   pH 6.0  5.0 - 8.0  Glucose, UA NEGATIVE  NEGATIVE mg/dL   Hgb urine dipstick NEGATIVE  NEGATIVE   Bilirubin Urine NEGATIVE  NEGATIVE   Ketones, ur NEGATIVE  NEGATIVE mg/dL   Protein, ur NEGATIVE  NEGATIVE mg/dL   Urobilinogen, UA 0.2  0.0 - 1.0 mg/dL   Nitrite NEGATIVE  NEGATIVE   Leukocytes, UA NEGATIVE   NEGATIVE  PREGNANCY, URINE      Component Value Range   Preg Test, Ur NEGATIVE  NEGATIVE  CBC WITH DIFFERENTIAL      Component Value Range   WBC 12.5 (*) 4.0 - 10.5 K/uL   RBC 4.82  3.87 - 5.11 MIL/uL   Hemoglobin 14.9  12.0 - 15.0 g/dL   HCT 16.1  09.6 - 04.5 %   MCV 89.6  78.0 - 100.0 fL   MCH 30.9  26.0 - 34.0 pg   MCHC 34.5  30.0 - 36.0 g/dL   RDW 40.9  81.1 - 91.4 %   Platelets 298  150 - 400 K/uL   Neutrophils Relative 68  43 - 77 %   Neutro Abs 8.5 (*) 1.7 - 7.7 K/uL   Lymphocytes Relative 19  12 - 46 %   Lymphs Abs 2.4  0.7 - 4.0 K/uL   Monocytes Relative 8  3 - 12 %   Monocytes Absolute 1.0  0.1 - 1.0 K/uL   Eosinophils Relative 5  0 - 5 %   Eosinophils Absolute 0.6  0.0 - 0.7 K/uL   Basophils Relative 1  0 - 1 %   Basophils Absolute 0.1  0.0 - 0.1 K/uL  COMPREHENSIVE METABOLIC PANEL      Component Value Range   Sodium 142  135 - 145 mEq/L   Potassium 4.0  3.5 - 5.1 mEq/L   Chloride 106  96 - 112 mEq/L   CO2 21  19 - 32 mEq/L   Glucose, Bld 102 (*) 70 - 99 mg/dL   BUN 12  6 - 23 mg/dL   Creatinine, Ser 7.82  0.50 - 1.10 mg/dL   Calcium 95.6  8.4 - 21.3 mg/dL   Total Protein 7.4  6.0 - 8.3 g/dL   Albumin 4.2  3.5 - 5.2 g/dL   AST 15  0 - 37 U/L   ALT 17  0 - 35 U/L   Alkaline Phosphatase 67  39 - 117 U/L   Total Bilirubin 0.2 (*) 0.3 - 1.2 mg/dL   GFR calc non Af Amer 66 (*) >90 mL/min   GFR calc Af Amer 76 (*) >90 mL/min  LIPASE, BLOOD      Component Value Range   Lipase 30  11 - 59 U/L  WET PREP, GENITAL      Component Value Range   Yeast Wet Prep HPF POC NONE SEEN  NONE SEEN   Trich, Wet Prep NONE SEEN  NONE SEEN   Clue Cells Wet Prep HPF POC FEW (*) NONE SEEN   WBC, Wet Prep HPF POC FEW (*) NONE SEEN    1. Pelvic pain       MDM  Recurrent pelvic pain. Old records are reviewed and she has been having frequent ED visits for painful conditions including migraine headaches, abdominal pain, ear pain, and dental pain. She has been treated for PID in  December, October, in June. She has had abdominal imaging with ultrasound and CT scan-both of which have been unremarkable. She had been referred to [facility] hospital for followup, but never made a followup appointment.  Workup  is unremarkable except for mild leukocytosis. Closer review of prior records shows that she has had numerous cervical cultures none of which have grown anything. She will be given empiric treatment for possible PID, but I suspect that she does not actually have PID. She is referred to [facility] clinic for further evaluation. She will be sent home with prescriptions for doxycycline and Percocet.     Dione Booze, MD 03/13/12 1022

## 2012-03-14 LAB — GC/CHLAMYDIA PROBE AMP: CT Probe RNA: NEGATIVE

## 2012-03-28 ENCOUNTER — Encounter: Payer: Medicaid Other | Admitting: Medical

## 2012-03-30 ENCOUNTER — Encounter (HOSPITAL_BASED_OUTPATIENT_CLINIC_OR_DEPARTMENT_OTHER): Payer: Self-pay

## 2012-03-30 ENCOUNTER — Emergency Department (HOSPITAL_BASED_OUTPATIENT_CLINIC_OR_DEPARTMENT_OTHER)
Admission: EM | Admit: 2012-03-30 | Discharge: 2012-03-30 | Disposition: A | Discharge status: Home / Self Care | Payer: Medicaid Other | Attending: Emergency Medicine | Admitting: Emergency Medicine

## 2012-03-30 DIAGNOSIS — F319 Bipolar disorder, unspecified: Secondary | ICD-10-CM | POA: Insufficient documentation

## 2012-03-30 DIAGNOSIS — Z8709 Personal history of other diseases of the respiratory system: Secondary | ICD-10-CM | POA: Insufficient documentation

## 2012-03-30 DIAGNOSIS — H53149 Visual discomfort, unspecified: Secondary | ICD-10-CM | POA: Insufficient documentation

## 2012-03-30 DIAGNOSIS — J45909 Unspecified asthma, uncomplicated: Secondary | ICD-10-CM | POA: Insufficient documentation

## 2012-03-30 DIAGNOSIS — Z8669 Personal history of other diseases of the nervous system and sense organs: Secondary | ICD-10-CM | POA: Insufficient documentation

## 2012-03-30 DIAGNOSIS — G43909 Migraine, unspecified, not intractable, without status migrainosus: Secondary | ICD-10-CM

## 2012-03-30 DIAGNOSIS — R112 Nausea with vomiting, unspecified: Secondary | ICD-10-CM | POA: Insufficient documentation

## 2012-03-30 DIAGNOSIS — Z79899 Other long term (current) drug therapy: Secondary | ICD-10-CM | POA: Insufficient documentation

## 2012-03-30 DIAGNOSIS — F17200 Nicotine dependence, unspecified, uncomplicated: Secondary | ICD-10-CM | POA: Insufficient documentation

## 2012-03-30 MED ORDER — DEXAMETHASONE SODIUM PHOSPHATE 10 MG/ML IJ SOLN
10.0000 mg | Freq: Once | INTRAMUSCULAR | Status: AC
  Administered 2012-03-30: 10 mg via INTRAVENOUS
  Filled 2012-03-30: qty 1

## 2012-03-30 MED ORDER — SODIUM CHLORIDE 0.9 % IV BOLUS (SEPSIS)
1000.0000 mL | Freq: Once | INTRAVENOUS | Status: AC
  Administered 2012-03-30: 1000 mL via INTRAVENOUS

## 2012-03-30 MED ORDER — ONDANSETRON HCL 4 MG/2ML IJ SOLN
4.0000 mg | Freq: Once | INTRAMUSCULAR | Status: AC
  Administered 2012-03-30: 4 mg via INTRAVENOUS
  Filled 2012-03-30: qty 2

## 2012-03-30 MED ORDER — DIPHENHYDRAMINE HCL 50 MG/ML IJ SOLN
25.0000 mg | Freq: Once | INTRAMUSCULAR | Status: AC
  Administered 2012-03-30: 25 mg via INTRAVENOUS
  Filled 2012-03-30: qty 1

## 2012-03-30 NOTE — ED Notes (Addendum)
Pt states that she has migraine with photo/phonophobia, nausea, and severe pain on the L side of her head, especially behind her L eye.  Pt states that this is different from previous migraines in that they have never been behind her eyes. Pt has taken a goody's powder with no relief at 1500 today.   Pt presents with family/friends/visitors.  Pt was asked if they wished to have these individuals present while all matters of their medical history were discussed throughout their stay.  Pt verbalized their wishes to have these individuals present during all discussions of their medical care.

## 2012-03-30 NOTE — ED Provider Notes (Signed)
History   This chart was scribed for Tiffany Bucco, MD scribed by Magnus Sinning. The patient was seen in room MH10/MH10 at 18:21   CSN: [id number]  Arrival date & time 03/30/12  1758     Chief Complaint  Patient presents with  . Migraine    (Consider location/radiation/quality/duration/timing/severity/associated sxs/prior treatment) The history is provided by the patient. No language interpreter was used.   Tiffany Whitney is a 32 y.o. female who presents to the Emergency Department complaining of gradually worsening constant severe left sided HA located behind her eye, onset approximately 4.5 hours ago with associated nausea and vomiting.  States that this is similar pain to her past migraines although it is behind her left eye which she usually does not have.    She denies neck pain, visual changes, fevers, speech difficulties, HA injury, recent falls, or difficulty moving extremities or strength.   Pt says she normally takes Toradol and phenergan for hx of migraines, but says she is out. She reports taking a goody powder at home before the HA worsened. Pt explains she normally gets migraines around period, which ended today.  LNMP: 03/25/12 ending today Past Medical History  Diagnosis Date  . Migraine   . Depression   . Bipolar 1 disorder   . Otitis media   . Strep throat   . Asthma     Past Surgical History  Procedure Date  . Tonsillectomy   . Cholecystectomy   . Exploratory laparotomy   . Tubal ligation   . Exploratory laparotomy   . Tympanostomy     History reviewed. No pertinent family history.  History  Substance Use Topics  . Smoking status: Current Every Day Smoker -- 1.0 packs/day    Types: Cigarettes  . Smokeless tobacco: Never Used  . Alcohol Use: No   Review of Systems  Constitutional: Negative for fever, chills, diaphoresis and fatigue.  HENT: Negative for congestion, rhinorrhea, sneezing and neck pain.   Eyes: Positive for photophobia.    Respiratory: Negative for cough, chest tightness and shortness of breath.   Cardiovascular: Negative for chest pain and leg swelling.  Gastrointestinal: Positive for nausea and vomiting. Negative for abdominal pain, diarrhea and blood in stool.  Genitourinary: Negative for frequency, hematuria, flank pain and difficulty urinating.  Musculoskeletal: Negative for back pain and arthralgias.  Skin: Negative for rash.  Neurological: Positive for headaches. Negative for dizziness, speech difficulty, weakness and numbness.  All other systems reviewed and are negative.    Allergies  Morphine and related and Reglan  Home Medications   Current Outpatient Rx  Name  Route  Sig  Dispense  Refill  . ARIPIPRAZOLE 5 MG PO TABS   Oral   Take 5 mg by mouth daily.          Marlin Canary HEADACHE PO   Oral   Take 1 packet by mouth once. Patient used this medication for pain.         . DESVENLAFAXINE SUCCINATE ER 50 MG PO TB24   Oral   Take 100 mg by mouth daily.          Marland Kitchen VISTARIL PO   Oral   Take by mouth. 1-2 tablets as needed at bedtime for insomnia         . KETOROLAC TROMETHAMINE 10 MG PO TABS   Oral   Take 10 mg by mouth every 6 (six) hours as needed.         Marland Kitchen PROMETHAZINE HCL 25 MG  PO TABS   Oral   Take 25 mg by mouth every 6 (six) hours as needed.         . TOPIRAMATE 25 MG PO TABS   Oral   Take 75 mg by mouth 2 (two) times daily.          . ALBUTEROL SULFATE HFA 108 (90 BASE) MCG/ACT IN AERS   Inhalation   Inhale 2 puffs into the lungs every 6 (six) hours as needed.         . AMOXICILLIN 500 MG PO CAPS   Oral   Take 1 capsule (500 mg total) by mouth 3 (three) times daily.   30 capsule   0   . ANTIPYRINE-BENZOCAINE 5.4-1.4 % OT SOLN   Right Ear   Place 3 drops into the right ear 4 (four) times daily as needed for pain.   10 mL   0   . BENZONATATE 200 MG PO CAPS   Oral   Take 1 capsule (200 mg total) by mouth 3 (three) times daily as needed for  cough.   30 capsule   0   . CETIRIZINE HCL 10 MG PO TABS   Oral   Take 10 mg by mouth daily. Patient uses this medication for her allergies.         Marland Kitchen CLONAZEPAM 0.5 MG PO TABS   Oral   Take 0.5 mg by mouth 2 (two) times daily as needed.         Marland Kitchen DOXYCYCLINE HYCLATE 100 MG PO CAPS   Oral   Take 1 capsule (100 mg total) by mouth 2 (two) times daily.   20 capsule   0   . DOXYCYCLINE HYCLATE 100 MG PO CAPS   Oral   Take 1 capsule (100 mg total) by mouth 2 (two) times daily.   20 capsule   0   . GUAIFENESIN-CODEINE 100-10 MG/5ML PO SOLN   Oral   Take 5 mLs by mouth 3 (three) times daily as needed for cough.   120 mL   0   . HYDROCODONE-ACETAMINOPHEN 5-325 MG PO TABS   Oral   Take 2 tablets by mouth every 4 (four) hours as needed for pain.   10 tablet   0   . NAPROXEN 375 MG PO TABS   Oral   Take 1 tablet (375 mg total) by mouth 2 (two) times daily.   20 tablet   0   . NAPROXEN 500 MG PO TABS   Oral   Take 1 tablet (500 mg total) by mouth 2 (two) times daily.   20 tablet   0   . OXYCODONE-ACETAMINOPHEN 5-325 MG PO TABS   Oral   Take 1 tablet by mouth every 4 (four) hours as needed for pain.   20 tablet   0   . OXYCODONE-ACETAMINOPHEN 5-325 MG PO TABS   Oral   Take 1 tablet by mouth every 4 (four) hours as needed for pain.   20 tablet   0   . PREDNISONE 10 MG PO TABS      6,5,4,3,2,1 taper   21 tablet   0     BP 140/74  Pulse 89  Temp 97.9 F (36.6 C) (Oral)  Resp 16  Ht 5\' 1"  (1.549 m)  Wt 177 lb (80.287 kg)  BMI 33.44 kg/m2  SpO2 100%  LMP 03/25/2012  Physical Exam  Nursing note and vitals reviewed. Constitutional: She is oriented to person, place, and time. She  appears well-developed and well-nourished.  HENT:  Head: Normocephalic and atraumatic.  Mouth/Throat: Oropharynx is clear and moist.  Eyes: Pupils are equal, round, and reactive to light.       Normal fundi  Neck: Normal range of motion. Neck supple. No Brudzinski's sign  and no Kernig's sign noted.  Cardiovascular: Normal rate, regular rhythm and normal heart sounds.   Pulmonary/Chest: Effort normal and breath sounds normal. No respiratory distress. She has no wheezes. She has no rales. She exhibits no tenderness.  Abdominal: Soft. Bowel sounds are normal. There is no tenderness. There is no rebound and no guarding.  Musculoskeletal: Normal range of motion. She exhibits no edema.  Lymphadenopathy:    She has no cervical adenopathy.  Neurological: She is alert and oriented to person, place, and time. She has normal strength. No cranial nerve deficit or sensory deficit. GCS eye subscore is 4. GCS verbal subscore is 5. GCS motor subscore is 6.       Finger to nose intact.  Skin: Skin is warm and dry. No rash noted.  Psychiatric: She has a normal mood and affect.    ED Course  Procedures (including critical care time) DIAGNOSTIC STUDIES: Oxygen Saturation is 100% on room air, normal by my interpretation.    COORDINATION OF CARE:  Labs Reviewed - No data to display No results found.   1. Migraine       MDM  Pt feeling much better after migraine cocktail, fluids.  Nothing to suggest ICH, meningitis. I personally performed the services described in this documentation, which was scribed in my presence.  The recorded information has been reviewed and considered.       Tiffany Bucco, MD 03/30/12 [phone]

## 2012-04-06 DIAGNOSIS — M797 Fibromyalgia: Secondary | ICD-10-CM

## 2012-04-06 HISTORY — DX: Fibromyalgia: M79.7

## 2012-04-12 ENCOUNTER — Encounter: Payer: Self-pay | Admitting: Medical

## 2012-04-12 ENCOUNTER — Ambulatory Visit (INDEPENDENT_AMBULATORY_CARE_PROVIDER_SITE_OTHER): Payer: Medicaid Other | Admitting: Medical

## 2012-04-12 VITALS — BP 117/81 | HR 83 | Ht 61.0 in | Wt 179.0 lb

## 2012-04-12 DIAGNOSIS — A499 Bacterial infection, unspecified: Secondary | ICD-10-CM

## 2012-04-12 DIAGNOSIS — N760 Acute vaginitis: Secondary | ICD-10-CM

## 2012-04-12 DIAGNOSIS — Z8742 Personal history of other diseases of the female genital tract: Secondary | ICD-10-CM | POA: Insufficient documentation

## 2012-04-12 DIAGNOSIS — B9689 Other specified bacterial agents as the cause of diseases classified elsewhere: Secondary | ICD-10-CM

## 2012-04-12 MED ORDER — METRONIDAZOLE 500 MG PO TABS: 500.0000 mg | ORAL_TABLET | Freq: Two times a day (BID) | ORAL | Status: DC

## 2012-04-12 NOTE — Progress Notes (Signed)
Patient ID: Tiffany Whitney, female   DOB: [birth year], 32 y.o.   MRN: [id number]  History:  Tiffany Whitney  is a 32 y.o. [id number] who presents to clinic today for ED follow-up. The patient was seen at [facility] on 03/13/12 with pelvic pain and treated for PID with Doxycycline. The patient has completed the course of antibiotics and feels much better. She denies any pain today. She is still having a small amount of white discharge, but it is much improved from previously. The patient states that she has been seen for the same thing numerous times in the last 6 months. She had visits in October and December 2013. She has never been told that she had BV.    The following portions of the patient's history were reviewed and updated as appropriate: allergies, current medications, past family history, past medical history, past social history, past surgical history and problem list.  Review of Systems:  Pertinent items are noted in HPI.  Objective:  Physical Exam BP 117/81  Pulse 83  Ht 5\' 1"  (1.549 m)  Wt 179 lb (81.194 kg)  BMI 33.82 kg/m2  LMP 03/26/2012 GENERAL: Well-developed, well-nourished female in no acute distress.  HEENT: Normocephalic, atraumatic.  LUNGS: Normal rate. Clear to auscultation bilaterally.  HEART: Regular rate and rhythm with no adventitious sounds.  ABDOMEN: Soft, nontender, nondistended. No organomegaly. Normal bowel sounds appreciated in all quadrants.   Labs and Imaging + clue cells on previous wet prep at ED. Not treated. Otherwise labs are normal.   Assessment & Plan:  Assessment: Bacterial vaginosis ? PID, resolved  Plans: 1. Rx for Flagyl sent to patient's pharmacy 2. Discussed hygiene products and probiotics to avoid recurrence 3. Patient may return to clinic PRN  Freddi Starr, PA-C 04/12/2012 10:41 AM

## 2012-04-12 NOTE — Patient Instructions (Signed)
Bacterial Vaginosis Bacterial vaginosis is an infection of the vagina. A healthy vagina has many kinds of good germs (bacteria). Sometimes the number of good germs can change. This allows bad germs to move in and cause an infection. You may be given medicine (antibiotics) to treat the infection. Or, you may not need treatment at all. HOME CARE  Take your medicine as told. Finish them even if you start to feel better.  Do not have sex until you finish your medicine.  Do not douche.  Practice safe sex.  Tell your sex partner that you have an infection. They should see their doctor for treatment if they have problems. GET HELP RIGHT AWAY IF:  You do not get better after 3 days of treatment.  You have grey fluid (discharge) coming from your vagina.  You have pain.  You have a temperature of 102 F (38.9 C) or higher. MAKE SURE YOU:   Understand these instructions.  Will watch your condition.  Will get help right away if you are not doing well or get worse. Document Released: 11/30/2007 Document Revised: 05/15/2011 Document Reviewed: 11/30/2007 [facility] Patient Information 2013 [place], [place].  Use gentle soaps, lotions and cleansers Dove for sensitive skin is a good mild soap Rephresh and Summer's Eve products are good for feminine washes Aveeno and Cetaphil lotions are good moisturizers Probiotics can be taken daily

## 2012-04-20 ENCOUNTER — Other Ambulatory Visit: Payer: Self-pay

## 2012-05-03 ENCOUNTER — Ambulatory Visit (INDEPENDENT_AMBULATORY_CARE_PROVIDER_SITE_OTHER): Payer: Medicaid Other | Admitting: Medical

## 2012-05-03 ENCOUNTER — Encounter: Payer: Self-pay | Admitting: Medical

## 2012-05-03 ENCOUNTER — Other Ambulatory Visit (HOSPITAL_COMMUNITY)
Admission: RE | Admit: 2012-05-03 | Discharge: 2012-05-03 | Disposition: A | Discharge status: Home / Self Care | Payer: Medicaid Other | Source: Ambulatory Visit | Attending: Medical | Admitting: Medical

## 2012-05-03 VITALS — BP 114/72 | HR 80 | Temp 98.2°F | Ht 61.5 in | Wt 182.4 lb

## 2012-05-03 DIAGNOSIS — Z01419 Encounter for gynecological examination (general) (routine) without abnormal findings: Secondary | ICD-10-CM | POA: Insufficient documentation

## 2012-05-03 DIAGNOSIS — Z1151 Encounter for screening for human papillomavirus (HPV): Secondary | ICD-10-CM | POA: Insufficient documentation

## 2012-05-03 NOTE — Patient Instructions (Signed)
Menorrhagia   Menorrhagia is when a menstrual period is heavier or longer than normal.  HOME CARE   Only take medicine as told by your doctor.   Do not take aspirin 1 week before or during your period. Aspirin can make the bleeding worse.   Lay down for a while if you change your tampon or pad more than once in 2 hours. This may help lessen the bleeding.   Take any iron pills as told by your doctor. Heavy bleeding may cause you to lack iron in your body.   Eat a healthy diet and foods with iron. These foods include leafy green vegetables, meat, liver, eggs, and whole grain breads and cereals.   Eat foods that are high in vitamin C. These include oranges, orange juice, and grapefruits. Vitamin C can help your body take in more iron.   Do not try to lose weight. Wait until the heavy bleeding has stopped and your iron level is normal.  GET HELP RIGHT AWAY IF:   You get a fever.   You have trouble breathing.   You bleed even more heavily than usual and pass blood clots.   You feel dizzy, weak, or pass out (faint).   You need to change your tampon or pad more than once an hour.   You feel sick to your stomach (nauseous), throw up (vomit), or have watery poop (diarrhea).   You have problems from medicine.  MAKE SURE YOU:    Understand these instructions.   Will watch your condition.   Will get help right away if you are not doing well or get worse.  Document Released: 11/30/2007 Document Revised: 05/15/2011 Document Reviewed: 11/30/2007  [employer] Patient Information 2013 [employer].

## 2012-05-03 NOTE — Progress Notes (Signed)
Patient ID: Tiffany Whitney, female   DOB: 03/24/1980, 32 y.o.   MRN: [id number] Subjective:    Tiffany Whitney is a 32 y.o. female who presents for an annual exam. The patient has no complaints today. The patient is sexually active. GYN screening history: last pap: was normal. The patient wears seatbelts: yes. The patient participates in regular exercise: not asked. Has the patient ever been transfused or tattooed?: not asked. The patient reports that there is not domestic violence in her life. The patient states that she has not had a pap smear since she was pregnant with her last child 8 years ago. She had an abnormal pap at age 70 years and had cryotherapy at that time. She has had normal pap smears since then. She does not report any concerns about her breasts today. She states that she has regular periods, but they are very heavy and painful. She has had a BTL. She states that a previous physician recommended "scrapping the lining of her uterus." She would like to talk to a surgeon about this option instead of birth control options discussed today.   Menstrual History: OB History   Grav Para Term Preterm Abortions TAB SAB Ect Mult Living   6 3 2 1 3  3   3       Patient's last menstrual period was 04/26/2012.    The following portions of the patient's history were reviewed and updated as appropriate: allergies, current medications, past family history, past medical history, past social history, past surgical history and problem list.  Review of Systems Pertinent items are noted in HPI.    Objective:   BP 114/72  Pulse 80  Temp(Src) 98.2 F (36.8 C)  Ht 5' 1.5" (1.562 m)  Wt 182 lb 6.4 oz (82.736 kg)  BMI 33.91 kg/m2  LMP 04/26/2012 GENERAL: Well-developed, well-nourished female in no acute distress.  HEENT: Normocephalic, atraumatic. Sclerae anicteric.  LUNGS: Clear to auscultation bilaterally.  HEART: Regular rate and rhythm. BREASTS: Symmetric in size. No masses, skin changes,  nipple drainage, or lymphadenopathy. ABDOMEN: Soft, nontender, nondistended. No organomegaly. PELVIC: Normal external female genitalia. Vagina is pink and rugated.  Normal discharge. Normal cervix contour. Pap smear obtained. Uterus is normal in size. No adnexal mass or tenderness.  EXTREMITIES: No cyanosis, clubbing, or edema.    Assessment:    Healthy female exam.  Dysmenorrhea  Menorrhagia   Plan:     1. Pap obtained. Patient will receive letter for normal pap. We will call with abnormal results  2. Patient will return to clinic in ~3 weeks to discuss ablation vs. D&C for menorrhagia and dysmenorrhea 3. Patient will need annual exam in one year and pap in 2017 if normal today

## 2012-05-06 ENCOUNTER — Emergency Department (HOSPITAL_BASED_OUTPATIENT_CLINIC_OR_DEPARTMENT_OTHER)
Admission: EM | Admit: 2012-05-06 | Discharge: 2012-05-06 | Disposition: A | Discharge status: Home / Self Care | Payer: Medicaid Other | Attending: Emergency Medicine | Admitting: Emergency Medicine

## 2012-05-06 ENCOUNTER — Encounter (HOSPITAL_BASED_OUTPATIENT_CLINIC_OR_DEPARTMENT_OTHER): Payer: Self-pay | Admitting: *Deleted

## 2012-05-06 DIAGNOSIS — Z8669 Personal history of other diseases of the nervous system and sense organs: Secondary | ICD-10-CM | POA: Insufficient documentation

## 2012-05-06 DIAGNOSIS — Z79899 Other long term (current) drug therapy: Secondary | ICD-10-CM | POA: Insufficient documentation

## 2012-05-06 DIAGNOSIS — R110 Nausea: Secondary | ICD-10-CM | POA: Insufficient documentation

## 2012-05-06 DIAGNOSIS — Z7982 Long term (current) use of aspirin: Secondary | ICD-10-CM | POA: Insufficient documentation

## 2012-05-06 DIAGNOSIS — G43909 Migraine, unspecified, not intractable, without status migrainosus: Secondary | ICD-10-CM | POA: Insufficient documentation

## 2012-05-06 DIAGNOSIS — Z8739 Personal history of other diseases of the musculoskeletal system and connective tissue: Secondary | ICD-10-CM | POA: Insufficient documentation

## 2012-05-06 DIAGNOSIS — F329 Major depressive disorder, single episode, unspecified: Secondary | ICD-10-CM | POA: Insufficient documentation

## 2012-05-06 DIAGNOSIS — F17200 Nicotine dependence, unspecified, uncomplicated: Secondary | ICD-10-CM | POA: Insufficient documentation

## 2012-05-06 DIAGNOSIS — L578 Other skin changes due to chronic exposure to nonionizing radiation: Secondary | ICD-10-CM | POA: Insufficient documentation

## 2012-05-06 DIAGNOSIS — F3289 Other specified depressive episodes: Secondary | ICD-10-CM | POA: Insufficient documentation

## 2012-05-06 DIAGNOSIS — Z8619 Personal history of other infectious and parasitic diseases: Secondary | ICD-10-CM | POA: Insufficient documentation

## 2012-05-06 DIAGNOSIS — F319 Bipolar disorder, unspecified: Secondary | ICD-10-CM | POA: Insufficient documentation

## 2012-05-06 DIAGNOSIS — J45909 Unspecified asthma, uncomplicated: Secondary | ICD-10-CM | POA: Insufficient documentation

## 2012-05-06 MED ORDER — ONDANSETRON HCL 4 MG/2ML IJ SOLN
4.0000 mg | Freq: Once | INTRAMUSCULAR | Status: AC
  Administered 2012-05-06: 4 mg via INTRAVENOUS
  Filled 2012-05-06: qty 2

## 2012-05-06 MED ORDER — DEXAMETHASONE SODIUM PHOSPHATE 10 MG/ML IJ SOLN
10.0000 mg | Freq: Once | INTRAMUSCULAR | Status: AC
  Administered 2012-05-06: 10 mg via INTRAVENOUS
  Filled 2012-05-06: qty 1

## 2012-05-06 MED ORDER — SODIUM CHLORIDE 0.9 % IV BOLUS (SEPSIS)
1000.0000 mL | Freq: Once | INTRAVENOUS | Status: AC
  Administered 2012-05-06: 1000 mL via INTRAVENOUS

## 2012-05-06 MED ORDER — DIPHENHYDRAMINE HCL 50 MG/ML IJ SOLN
25.0000 mg | Freq: Once | INTRAMUSCULAR | Status: AC
  Administered 2012-05-06: 25 mg via INTRAVENOUS
  Filled 2012-05-06: qty 1

## 2012-05-06 NOTE — ED Notes (Signed)
Migraine headache. Pain started this evening.

## 2012-05-06 NOTE — ED Provider Notes (Signed)
History  This chart was scribed for Charles B. Bernette [name], MD by Marlyne Beards, ED Scribe. The patient was seen in room MH09/MH09. Patient's care was started at 7:55 PM.    CSN: [id number]  Arrival date & time 05/06/12  1953   First MD Initiated Contact with Patient 05/06/12 1955      Chief Complaint  Patient presents with  . Migraine     The history is provided by the patient. No language interpreter was used.    HPI Comments: Tiffany Whitney is a 31 y.o. female with history of migraines who presents to the Emergency Department complaining of a moderate, constant right-sided headache that began a few hours ago. She states that current headache is typical of past migraines. There is associated nausea and photosensititivty. Pt reports she took Goody's Powder with no immediate relief. Pt has been seen here in the past for migraine headaches and was treated with "migraine cocktail" and IV fluids with significant relief. No blurred vision. Pt denies fever, chills, cough, vomiting, diarrhea, SOB, weakness, and any other associated symptoms. Patient has not seen a neurologist for this problem.  PCP - [facility]    Past Medical History  Diagnosis Date  . Migraine   . Depression   . Bipolar 1 disorder   . Otitis media   . Strep throat   . Asthma   . Fibromyalgia 04/2012    Past Surgical History  Procedure Laterality Date  . Tonsillectomy    . Cholecystectomy    . Exploratory laparotomy    . Tubal ligation    . Exploratory laparotomy    . Tympanostomy      Family History  Problem Relation Age of Onset  . Depression Mother     History  Substance Use Topics  . Smoking status: Current Every Day Smoker -- 1.00 packs/day    Types: Cigarettes  . Smokeless tobacco: Never Used  . Alcohol Use: No    OB History   Grav Para Term Preterm Abortions TAB SAB Ect Mult Living   6 3 2 1 3  3   3       Review of Systems A complete 10 system review of systems was obtained  and all systems are negative except as noted in the HPI and PMH.   Allergies  Morphine and related and Reglan  Home Medications   Current Outpatient Rx  Name  Route  Sig  Dispense  Refill  . ARIPiprazole (ABILIFY) 5 MG tablet   Oral   Take 5 mg by mouth daily.          . Aspirin-Acetaminophen-Caffeine (GOODY HEADACHE PO)   Oral   Take 1 packet by mouth once. Patient used this medication for pain.         . cetirizine (ZYRTEC) 10 MG tablet   Oral   Take 10 mg by mouth daily. Patient uses this medication for her allergies.         . clonazePAM (KLONOPIN) 0.5 MG tablet   Oral   Take 0.5 mg by mouth 2 (two) times daily as needed.         . desvenlafaxine (PRISTIQ) 50 MG 24 hr tablet   Oral   Take 100 mg by mouth daily.          . hydrOXYzine (ATARAX/VISTARIL) 25 MG tablet   Oral   Take 25 mg by mouth 2 (two) times daily.         . pregabalin (LYRICA) 75  MG capsule   Oral   Take 75 mg by mouth 2 (two) times daily.         . promethazine (PHENERGAN) 25 MG tablet   Oral   Take 25 mg by mouth every 6 (six) hours as needed.         . topiramate (TOPAMAX) 25 MG tablet   Oral   Take 75 mg by mouth 2 (two) times daily.            Triage Vitals: BP 112/82  Pulse 85  Temp(Src) 97.9 F (36.6 C) (Oral)  Resp 20  Wt 182 lb (82.555 kg)  BMI 33.84 kg/m2  SpO2 100%  LMP 04/26/2012  Physical Exam  Nursing note and vitals reviewed. Constitutional: She is oriented to person, place, and time. She appears well-developed and well-nourished.  HENT:  Head: Normocephalic and atraumatic.  Eyes: EOM are normal. Pupils are equal, round, and reactive to light.  Neck: Normal range of motion. Neck supple.  Cardiovascular: Normal rate, regular rhythm, normal heart sounds and intact distal pulses.   Pulmonary/Chest: Effort normal and breath sounds normal.  Abdominal: Bowel sounds are normal. She exhibits no distension. There is no tenderness.  Musculoskeletal: Normal  range of motion. She exhibits no edema and no tenderness.  Neurological: She is alert and oriented to person, place, and time. She has normal strength. No cranial nerve deficit or sensory deficit.  Skin: Skin is warm and dry. No rash noted.  Psychiatric: She has a normal mood and affect.    ED Course  Procedures (including critical care time) DIAGNOSTIC STUDIES: Oxygen Saturation is 100% on room air, normal by my interpretation.    COORDINATION OF CARE: 8:10 PM Discussed ED treatment with pt and pt agrees.    No diagnosis found.    MDM  Headache resolved, patient feeling better and ready to go home. Advised to follow up with her PCP for long term headache management.       I personally performed the services described in this documentation, which was scribed in my presence. The recorded information has been reviewed and is accurate.        Charles B. Bernette [name], MD 05/06/12 2125

## 2012-05-13 ENCOUNTER — Telehealth: Payer: Self-pay | Admitting: Obstetrics and Gynecology

## 2012-05-13 NOTE — Telephone Encounter (Signed)
Patient called requesting for pap result from 04/2012. Advised that it was normal; no abnormal cells detected. Patient agrees and satisfied.

## 2012-05-17 ENCOUNTER — Encounter (HOSPITAL_BASED_OUTPATIENT_CLINIC_OR_DEPARTMENT_OTHER): Payer: Self-pay

## 2012-05-17 ENCOUNTER — Emergency Department (HOSPITAL_BASED_OUTPATIENT_CLINIC_OR_DEPARTMENT_OTHER)
Admission: EM | Admit: 2012-05-17 | Discharge: 2012-05-17 | Disposition: A | Discharge status: Home / Self Care | Payer: Medicaid Other | Attending: Emergency Medicine | Admitting: Emergency Medicine

## 2012-05-17 DIAGNOSIS — F17200 Nicotine dependence, unspecified, uncomplicated: Secondary | ICD-10-CM | POA: Insufficient documentation

## 2012-05-17 DIAGNOSIS — J45909 Unspecified asthma, uncomplicated: Secondary | ICD-10-CM | POA: Insufficient documentation

## 2012-05-17 DIAGNOSIS — Z8669 Personal history of other diseases of the nervous system and sense organs: Secondary | ICD-10-CM | POA: Insufficient documentation

## 2012-05-17 DIAGNOSIS — IMO0001 Reserved for inherently not codable concepts without codable children: Secondary | ICD-10-CM | POA: Insufficient documentation

## 2012-05-17 DIAGNOSIS — Z7982 Long term (current) use of aspirin: Secondary | ICD-10-CM | POA: Insufficient documentation

## 2012-05-17 DIAGNOSIS — G43909 Migraine, unspecified, not intractable, without status migrainosus: Secondary | ICD-10-CM | POA: Insufficient documentation

## 2012-05-17 DIAGNOSIS — Z8709 Personal history of other diseases of the respiratory system: Secondary | ICD-10-CM | POA: Insufficient documentation

## 2012-05-17 DIAGNOSIS — Z79899 Other long term (current) drug therapy: Secondary | ICD-10-CM | POA: Insufficient documentation

## 2012-05-17 DIAGNOSIS — F319 Bipolar disorder, unspecified: Secondary | ICD-10-CM | POA: Insufficient documentation

## 2012-05-17 MED ORDER — SODIUM CHLORIDE 0.9 % IV BOLUS (SEPSIS)
1000.0000 mL | Freq: Once | INTRAVENOUS | Status: AC
  Administered 2012-05-17: 1000 mL via INTRAVENOUS

## 2012-05-17 MED ORDER — ONDANSETRON HCL 4 MG/2ML IJ SOLN
4.0000 mg | Freq: Once | INTRAMUSCULAR | Status: AC
  Administered 2012-05-17: 4 mg via INTRAVENOUS
  Filled 2012-05-17: qty 2

## 2012-05-17 MED ORDER — DIPHENHYDRAMINE HCL 50 MG/ML IJ SOLN
25.0000 mg | Freq: Once | INTRAMUSCULAR | Status: AC
  Administered 2012-05-17: 25 mg via INTRAVENOUS
  Filled 2012-05-17: qty 1

## 2012-05-17 MED ORDER — SUMATRIPTAN SUCCINATE 6 MG/0.5ML ~~LOC~~ SOLN: 6.0000 mg | Freq: Once | SUBCUTANEOUS | Status: AC

## 2012-05-17 MED ORDER — SUMATRIPTAN SUCCINATE 6 MG/0.5ML ~~LOC~~ SOLN
SUBCUTANEOUS | Status: AC
  Administered 2012-05-17: 6 mg via SUBCUTANEOUS
  Filled 2012-05-17: qty 0.5

## 2012-05-17 MED ORDER — METHYLPREDNISOLONE SODIUM SUCC 125 MG IJ SOLR
125.0000 mg | Freq: Once | INTRAMUSCULAR | Status: AC
  Administered 2012-05-17: 125 mg via INTRAVENOUS
  Filled 2012-05-17: qty 2

## 2012-05-17 MED ORDER — KETOROLAC TROMETHAMINE 30 MG/ML IJ SOLN
30.0000 mg | Freq: Once | INTRAMUSCULAR | Status: AC
  Administered 2012-05-17: 30 mg via INTRAVENOUS
  Filled 2012-05-17: qty 1

## 2012-05-17 NOTE — ED Notes (Signed)
Pt states that she woke up with migraine headache this morning, + nausea, R sided headache.  Pt states that she was recently started on medication for fibromyalgia, and PCP did not want to start any migraine control medications concurrently with medication for fibromyalgia.  Dr Rayford Halsted at Dr Talbot's office is her PCP.

## 2012-05-17 NOTE — ED Provider Notes (Signed)
History     CSN: [id number]  Arrival date & time 05/17/12  5784   First MD Initiated Contact with Patient 05/17/12 [phone]      Chief Complaint  Patient presents with  . Migraine    (Consider location/radiation/quality/duration/timing/severity/associated sxs/prior treatment) HPI Comments: Patient with history of Bipolar, Fibromyalgia, and migraine headaches.  Presents with complaints of severe migraine that woke her up at 4 AM.  This is similar to her prior migraines.  No relief with excedrin migraine.  History of prior ED visits for same.  Patient is a 32 y.o. female presenting with migraines. The history is provided by the patient.  Migraine This is a recurrent problem. Episode onset: 4 AM. The problem occurs constantly. The problem has not changed since onset.Exacerbated by: light, noise. Nothing relieves the symptoms. Treatments tried: excedrin migraine. The treatment provided no relief.    Past Medical History  Diagnosis Date  . Migraine   . Depression   . Bipolar 1 disorder   . Otitis media   . Strep throat   . Asthma   . Fibromyalgia 04/2012    Past Surgical History  Procedure Laterality Date  . Tonsillectomy    . Cholecystectomy    . Exploratory laparotomy    . Tubal ligation    . Exploratory laparotomy    . Tympanostomy      Family History  Problem Relation Age of Onset  . Depression Mother     History  Substance Use Topics  . Smoking status: Current Every Day Smoker -- 1.00 packs/day    Types: Cigarettes  . Smokeless tobacco: Never Used  . Alcohol Use: No    OB History   Grav Para Term Preterm Abortions TAB SAB Ect Mult Living   6 3 2 1 3  3   3       Review of Systems  All other systems reviewed and are negative.    Allergies  Morphine and related and Reglan  Home Medications   Current Outpatient Rx  Name  Route  Sig  Dispense  Refill  . ARIPiprazole (ABILIFY) 5 MG tablet   Oral   Take 5 mg by mouth daily.          Marland Kitchen  aspirin-acetaminophen-caffeine (EXCEDRIN MIGRAINE) 250-250-65 MG per tablet   Oral   Take 2 tablets by mouth every 6 (six) hours as needed for pain.         . Aspirin-Acetaminophen-Caffeine (GOODY HEADACHE PO)   Oral   Take 1 packet by mouth once. Patient used this medication for pain.         . busPIRone (BUSPAR) 15 MG tablet   Oral   Take 15 mg by mouth 2 (two) times daily.         . cetirizine (ZYRTEC) 10 MG tablet   Oral   Take 10 mg by mouth daily. Patient uses this medication for her allergies.         Marland Kitchen desvenlafaxine (PRISTIQ) 50 MG 24 hr tablet   Oral   Take 100 mg by mouth daily.          Marland Kitchen gabapentin (NEURONTIN) 300 MG capsule   Oral   Take 300 mg by mouth 4 (four) times daily.         . hydrOXYzine (ATARAX/VISTARIL) 25 MG tablet   Oral   Take 25 mg by mouth 2 (two) times daily.         . pregabalin (LYRICA) 75 MG capsule  Oral   Take 75 mg by mouth 2 (two) times daily.         Marland Kitchen topiramate (TOPAMAX) 25 MG tablet   Oral   Take 75 mg by mouth 2 (two) times daily.          . clonazePAM (KLONOPIN) 0.5 MG tablet   Oral   Take 0.5 mg by mouth 2 (two) times daily as needed.         . promethazine (PHENERGAN) 25 MG tablet   Oral   Take 25 mg by mouth every 6 (six) hours as needed.           BP 134/73  Pulse 91  Temp(Src) 98.5 F (36.9 C) (Oral)  Resp 20  Ht 5\' 1"  (1.549 m)  Wt 182 lb (82.555 kg)  BMI 34.41 kg/m2  SpO2 100%  LMP 04/26/2012  Physical Exam  Nursing note and vitals reviewed. Constitutional: She is oriented to person, place, and time. She appears well-developed and well-nourished. No distress.  HENT:  Head: Normocephalic and atraumatic.  Mouth/Throat: Oropharynx is clear and moist.  Eyes: EOM are normal. Pupils are equal, round, and reactive to light.  There is no papilledema upon fundoscopic exam.  Neck: Normal range of motion. Neck supple.  Cardiovascular: Normal rate and regular rhythm.   No murmur  heard. Pulmonary/Chest: Effort normal.  Abdominal: Soft. Bowel sounds are normal.  Musculoskeletal: Normal range of motion.  Neurological: She is alert and oriented to person, place, and time. No cranial nerve deficit. She exhibits normal muscle tone. Coordination normal.  Skin: Skin is warm and dry. She is not diaphoretic.    ED Course  Procedures (including critical care time)  Labs Reviewed - No data to display No results found.   No diagnosis found.    MDM  Relief achieved with meds given.  No indications for LP or ct scan.  Will discharge, to follow up prn.        Geoffery Lyons, MD 05/17/12 1045

## 2012-05-24 ENCOUNTER — Ambulatory Visit (INDEPENDENT_AMBULATORY_CARE_PROVIDER_SITE_OTHER): Payer: Medicaid Other | Admitting: Obstetrics & Gynecology

## 2012-05-24 ENCOUNTER — Encounter: Payer: Self-pay | Admitting: Obstetrics & Gynecology

## 2012-05-24 VITALS — BP 115/75 | HR 73 | Temp 97.1°F | Ht 61.5 in | Wt 189.1 lb

## 2012-05-24 DIAGNOSIS — N949 Unspecified condition associated with female genital organs and menstrual cycle: Secondary | ICD-10-CM

## 2012-05-24 DIAGNOSIS — N920 Excessive and frequent menstruation with regular cycle: Secondary | ICD-10-CM

## 2012-05-24 DIAGNOSIS — R102 Pelvic and perineal pain: Secondary | ICD-10-CM

## 2012-05-24 NOTE — Progress Notes (Signed)
Subjective:     Patient ID: Tiffany Whitney, female   DOB: 06-20-1980, 32 y.o.   MRN: [id number]  HPI Pt with h/o menorrhagia and dysmenorrhea.  She reports going through pads q 1 hour while on cycle for the first 2 days.    She wants resolution of the pain and bleeding.  Past Medical History  Diagnosis Date  . Migraine   . Depression   . Bipolar 1 disorder   . Otitis media   . Strep throat   . Asthma   . Fibromyalgia 04/2012   Past Surgical History  Procedure Laterality Date  . Tonsillectomy    . Cholecystectomy    . Exploratory laparotomy    . Tubal ligation    . Exploratory laparotomy    . Tympanostomy     Current Outpatient Prescriptions on File Prior to Visit  Medication Sig Dispense Refill  . ARIPiprazole (ABILIFY) 5 MG tablet Take 5 mg by mouth daily.       Marland Kitchen aspirin-acetaminophen-caffeine (EXCEDRIN MIGRAINE) 250-250-65 MG per tablet Take 2 tablets by mouth every 6 (six) hours as needed for pain.      . Aspirin-Acetaminophen-Caffeine (GOODY HEADACHE PO) Take 1 packet by mouth once. Patient used this medication for pain.      . busPIRone (BUSPAR) 15 MG tablet Take 15 mg by mouth 2 (two) times daily.      . cetirizine (ZYRTEC) 10 MG tablet Take 10 mg by mouth daily. Patient uses this medication for her allergies.      Marland Kitchen gabapentin (NEURONTIN) 300 MG capsule Take 300 mg by mouth 4 (four) times daily.      . hydrOXYzine (ATARAX/VISTARIL) 25 MG tablet Take 25 mg by mouth 2 (two) times daily.      Marland Kitchen topiramate (TOPAMAX) 25 MG tablet Take 75 mg by mouth 2 (two) times daily.       . clonazePAM (KLONOPIN) 0.5 MG tablet Take 0.5 mg by mouth 2 (two) times daily as needed.      . desvenlafaxine (PRISTIQ) 50 MG 24 hr tablet Take 100 mg by mouth daily.       . pregabalin (LYRICA) 75 MG capsule Take 75 mg by mouth 2 (two) times daily.      . promethazine (PHENERGAN) 25 MG tablet Take 25 mg by mouth every 6 (six) hours as needed.       No current facility-administered medications on file  prior to visit.   Allergies  Allergen Reactions  . Morphine And Related Hives  . Reglan (Metoclopramide)     jittery   History   Social History  . Marital Status: Married    Spouse Name: N/A    Number of Children: N/A  . Years of Education: N/A   Occupational History  . Not on file.   Social History Main Topics  . Smoking status: Current Every Day Smoker -- 1.00 packs/day    Types: Cigarettes  . Smokeless tobacco: Never Used  . Alcohol Use: No  . Drug Use: No  . Sexually Active: Yes    Birth Control/ Protection: Surgical   Other Topics Concern  . Not on file   Social History Narrative  . No narrative on file      Review of Systems     Objective:   Physical Exam BP 115/75  Pulse 73  Temp(Src) 97.1 F (36.2 C) (Oral)  Ht 5' 1.5" (1.562 m)  Wt 189 lb 1.6 oz (85.775 kg)  BMI 35.16 kg/m2  LMP  04/26/2012  Exam deferred  CBC    Component Value Date/Time   WBC 12.5* 03/13/2012 0900   RBC 4.82 03/13/2012 0900   HGB 14.9 03/13/2012 0900   HCT 43.2 03/13/2012 0900   PLT 298 03/13/2012 0900   MCV 89.6 03/13/2012 0900   MCH 30.9 03/13/2012 0900   MCHC 34.5 03/13/2012 0900   RDW 12.8 03/13/2012 0900   LYMPHSABS 2.4 03/13/2012 0900   MONOABS 1.0 03/13/2012 0900   EOSABS 0.6 03/13/2012 0900   BASOSABS 0.1 03/13/2012 0900     12/18/2011 Sono: Clinical Data: Question tubo-ovarian abscess. Worsening pelvic  pain since being diagnosed with cervicitis on 12/17/2011. Post  bilateral tubal ligation. LMP 11/28/2011  TRANSABDOMINAL AND TRANSVAGINAL ULTRASOUND OF PELVIS  Technique: Both transabdominal and transvaginal ultrasound  examinations of the pelvis were performed. Transabdominal technique  was performed for global imaging of the pelvis including uterus,  ovaries, adnexal regions, and pelvic cul-de-sac.  It was necessary to proceed with endovaginal exam following the  transabdominal exam to visualize the myometrium, endometrium and  adnexa.  Comparison: 03/01/2009  Findings:   Uterus: Is retroverted and retroflexed and demonstrates a sagittal  length of 8.4 cm, depth of 5.1 cm and width of 4.9 cm. A  homogeneous myometrium is seen  Endometrium: Appears trilayered with a width of 12 mm. No areas of  focal thickening or heterogeneity are seen and this would correlate  with a periovulatory endometrial stripe and correspond with the  provided LMP of 11/28/2011  Right ovary: Has a normal appearance measuring 3.7 x 2.0 x 1.8 cm  Left ovary: Has a normal appearance measuring 3.5 x 2.0 x 1.7 cm  Other findings: A trace of simple free fluid is noted in the right  adnexa. No separate adnexal masses are identified.  IMPRESSION:  Normal periovulatory pelvic ultrasound with no sonographic stigmata  suggestive of pelvic inflammatory disease or tubo-ovarian abscess  identified.    05/03/2012 PAP Diagnosis NEGATIVE FOR INTRAEPITHELIAL LESIONS OR MALIGNANCY.     Assessment:     Menorrhagia/dysmenorrhea- d/w pt treatment options.  Reviewed with pt the risks of endometrial ablation including increased pain vs Mirena.      Plan:     Pt to f/u for Mirena IUD

## 2012-05-24 NOTE — Patient Instructions (Addendum)
Endometrial Ablation Endometrial ablation removes the lining of the uterus (endometrium). It is usually a same day, outpatient treatment. Ablation helps avoid major surgery (such as a hysterectomy). A hysterectomy is removal of the cervix and uterus. Endometrial ablation has less risk and complications, has a shorter recovery period and is less expensive. After endometrial ablation, most women will have little or no menstrual bleeding. You may not keep your fertility. Pregnancy is no longer likely after this procedure but if you are pre-menopausal, you still need to use a reliable method of birth control following the procedure because pregnancy can occur. REASONS TO HAVE THE PROCEDURE MAY INCLUDE:  Heavy periods.  Bleeding that is causing anemia.  Anovulatory bleeding, very irregular, bleeding.  Bleeding submucous fibroids (on the lining inside the uterus) if they are smaller than 3 centimeters. REASONS NOT TO HAVE THE PROCEDURE MAY INCLUDE:  You wish to have more children.  You have a pre-cancerous or cancerous problem. The cause of any abnormal bleeding must be diagnosed before having the procedure.  You have pain coming from the uterus.  You have a submucus fibroid larger than 3 centimeters.  You recently had a baby.  You recently had an infection in the uterus.  You have a severe retro-flexed, tipped uterus and cannot insert the instrument to do the ablation.  You had a Cesarean section or deep major surgery on the uterus.  The inner cavity of the uterus is too large for the endometrial ablation instrument. RISKS AND COMPLICATIONS   Perforation of the uterus.  Bleeding.  Infection of the uterus, bladder or vagina.  Injury to surrounding organs.  Cutting the cervix.  An air bubble to the lung (air embolus).  Pregnancy following the procedure.  Failure of the procedure to help the problem requiring hysterectomy.  Decreased ability to diagnose cancer in the lining of  the uterus. BEFORE THE PROCEDURE  The lining of the uterus must be tested to make sure there is no pre-cancerous or cancer cells present.  Medications may be given to make the lining of the uterus thinner.  Ultrasound may be used to evaluate the size and look for abnormalities of the uterus.  Future pregnancy is not desired. PROCEDURE  There are different ways to destroy the lining of the uterus.   Resectoscope - radio frequency-alternating electric current is the most common one used.  Cryotherapy - freezing the lining of the uterus.  Heated Free Liquid - heated salt (saline) solution inserted into the uterus.  Microwave - uses high energy microwaves in the uterus.  Thermal Balloon - a catheter with a balloon tip is inserted into the uterus and filled with heated fluid. Your caregiver will talk with you about the method used in this clinic. They will also instruct you on the pros and cons of the procedure. Endometrial ablation is performed along with a procedure called operative hysteroscopy. A narrow viewing tube is inserted through the birth canal (vagina) and through the cervix into the uterus. A tiny camera attached to the viewing tube (hysteroscope) allows the uterine cavity to be shown on a TV monitor during surgery. Your uterus is filled with a harmless liquid to make the procedure easier. The lining of the uterus is then removed. The lining can also be removed with a resectoscope which allows your surgeon to cut away the lining of the uterus under direct vision. Usually, you will be able to go home within an hour after the procedure. HOME CARE INSTRUCTIONS   Do   not drive for 24 hours.  No tampons, douching or intercourse for 2 weeks or until your caregiver approves.  Rest at home for 24 to 48 hours. You may then resume normal activities unless told differently by your caregiver.  Take your temperature two times a day for 4 days, and record it.  Take any medications your  caregiver has ordered, as directed.  Use some form of contraception if you are pre-menopausal and do not want to get pregnant. Bleeding after the procedure is normal. It varies from light spotting and mildly watery to bloody discharge for 4 to 6 weeks. You may also have mild cramping. Only take over-the-counter or prescription medicines for pain, discomfort, or fever as directed by your caregiver. Do not use aspirin, as this may aggravate bleeding. Frequent urination during the first 24 hours is normal. You will not know how effective your surgery is until at least 3 months after the surgery. SEEK IMMEDIATE MEDICAL CARE IF:   Bleeding is heavier than a normal menstrual cycle.  An oral temperature above 102 F (38.9 C) develops.  You have increasing cramps or pains not relieved with medication or develop belly (abdominal) pain which does not seem to be related to the same area of earlier cramping and pain.  You are light headed, weak or have fainting episodes.  You develop pain in the shoulder strap areas.  You have chest or leg pain.  You have abnormal vaginal discharge.  You have painful urination. Document Released: 12/31/2003 Document Revised: 05/15/2011 Document Reviewed: 03/30/2007 [facility] Patient Information 2013 [place], [place]. Pelvic Pain Pelvic pain is pain below the belly button and located between your hips. Acute pain may last a few hours or days. Chronic pelvic pain may last weeks and months. The cause may be different for different types of pain. The pain may be dull or sharp, mild or severe and can interfere with your daily activities. Write down and tell your caregiver:  Exactly where the pain is located. If it comes and goes or is there all the time. When it happens (with sex, urination, bowel movement, etc.) If the pain is related to your menstrual period or stress. Your caregiver will take a full history and do a complete physical exam and Pap test. CAUSES    Painful menstrual periods (dysmenorrhea). Normal ovulation (Mittelschmertz) that occurs in the middle of the menstrual cycle every month. The pelvic organs get engorged with blood just before the menstrual period (pelvic congestive syndrome). Scar tissue from an infection or past surgery (pelvic adhesions). Cancer of the female pelvic organs. When there is pain with cancer, it has been there for a long time. The lining of the uterus (endometrium) abnormally grows in places like the pelvis and on the pelvic organs (endometriosis). A form of endometriosis with the lining of the uterus present inside of the muscle tissue of the uterus (adenomyosis). Fibroid tumor (noncancerous) in the uterus. Bladder problems such as infection, bladder spasms of the muscle tissue of the bladder. Intestinal problems (irritable bowel syndrome, colitis, an ulcer or gastrointestinal infection). Polyps of the cervix or uterus. Pregnancy in the tube (ectopic pregnancy). The opening of the cervix is too small for the menstrual blood to flow through it (cervical stenosis). Physical or sexual abuse (past or present). Musculo-skeletal problems from poor posture, problems with the vertebrae of the lower back or the uterine pelvic muscles falling (prolapse). Psychological problems such as depression or stress. IUD (intrauterine device) in the uterus. DIAGNOSIS  Tests to  make a diagnosis depends on the type, location, severity and what causes the pain to occur. Tests that may be needed include: Blood tests. Urine tests Ultrasound. X-rays. CT Scan. MRI. Laparoscopy. Major surgery. TREATMENT  Treatment will depend on the cause of the pain, which includes: Prescription or over-the-counter pain medication. Antibiotics. Birth control pills. Hormone treatment. Nerve blocking injections. Physical therapy. Antidepressants. Counseling with a psychiatrist or psychologist. Minor or major surgery. HOME CARE INSTRUCTIONS   Only take over-the-counter or prescription medicines for pain, discomfort or fever as directed by your caregiver. Follow your caregiver's advice to treat your pain. Rest. Avoid sexual intercourse if it causes the pain. Apply warm or cold compresses (which ever works best) to the pain area. Do relaxation exercises such as yoga or meditation. Try acupuncture. Avoid stressful situations. Try group therapy. If the pain is because of a stomach/intestinal upset, drink clear liquids, eat a bland light food diet until the symptoms go away. SEEK MEDICAL CARE IF:  You need stronger prescription pain medication. You develop pain with sexual intercourse. You have pain with urination. You develop a temperature of 102 F (38.9 C) with the pain. You are still in pain after 4 hours of taking prescription medication for the pain. You need depression medication. Your IUD is causing pain and you want it removed. SEEK IMMEDIATE MEDICAL CARE IF: You develop very severe pain or tenderness. You faint, have chills, severe weakness or dehydration. You develop heavy vaginal bleeding or passing solid tissue. You develop a temperature of 102 F (38.9 C) with the pain. You have blood in the urine. You are being physically or sexually abused. You have uncontrolled vomiting and diarrhea. You are depressed and afraid of harming yourself or someone else. Document Released: 03/30/2004 Document Revised: 05/15/2011 Document Reviewed: 12/26/2007 [facility] Patient Information 2013 [place], [place]. Levonorgestrel intrauterine device (IUD) What is this medicine? LEVONORGESTREL IUD (LEE voe nor jes trel) is a contraceptive (birth control) device. It is used to prevent pregnancy and to treat heavy bleeding that occurs during your period. It can be used for up to 5 years. This medicine may be used for other purposes; ask your health care provider or pharmacist if you have questions. What should I tell my health care  provider before I take this medicine? They need to know if you have any of these conditions: -abnormal Pap smear -cancer of the breast, uterus, or cervix -diabetes -endometritis -genital or pelvic infection now or in the past -have more than one sexual partner or your partner has more than one partner -heart disease -history of an ectopic or tubal pregnancy -immune system problems -IUD in place -liver disease or tumor -problems with blood clots or take blood-thinners -use intravenous drugs -uterus of unusual shape -vaginal bleeding that has not been explained -an unusual or allergic reaction to levonorgestrel, other hormones, silicone, or polyethylene, medicines, foods, dyes, or preservatives -pregnant or trying to get pregnant -breast-feeding How should I use this medicine? This device is placed inside the uterus by a health care professional. Talk to your pediatrician regarding the use of this medicine in children. Special care may be needed. Overdosage: If you think you have taken too much of this medicine contact a poison control center or emergency room at once. NOTE: This medicine is only for you. Do not share this medicine with others. What if I miss a dose? This does not apply. What may interact with this medicine? Do not take this medicine with any of the following medications: -  amprenavir -bosentan -fosamprenavir This medicine may also interact with the following medications: -aprepitant -barbiturate medicines for inducing sleep or treating seizures -bexarotene -griseofulvin -medicines to treat seizures like carbamazepine, ethotoin, felbamate, oxcarbazepine, phenytoin, topiramate -modafinil -pioglitazone -rifabutin -rifampin -rifapentine -some medicines to treat HIV infection like atazanavir, indinavir, lopinavir, nelfinavir, tipranavir, ritonavir -St. John's wort -warfarin This list may not describe all possible interactions. Give your health care provider a  list of all the medicines, herbs, non-prescription drugs, or dietary supplements you use. Also tell them if you smoke, drink alcohol, or use illegal drugs. Some items may interact with your medicine. What should I watch for while using this medicine? Visit your doctor or health care professional for regular check ups. See your doctor if you or your partner has sexual contact with others, becomes HIV positive, or gets a sexual transmitted disease. This product does not protect you against HIV infection (AIDS) or other sexually transmitted diseases. You can check the placement of the IUD yourself by reaching up to the top of your vagina with clean fingers to feel the threads. Do not pull on the threads. It is a good habit to check placement after each menstrual period. Call your doctor right away if you feel more of the IUD than just the threads or if you cannot feel the threads at all. The IUD may come out by itself. You may become pregnant if the device comes out. If you notice that the IUD has come out use a backup birth control method like condoms and call your health care provider. Using tampons will not change the position of the IUD and are okay to use during your period. What side effects may I notice from receiving this medicine? Side effects that you should report to your doctor or health care professional as soon as possible: -allergic reactions like skin rash, itching or hives, swelling of the face, lips, or tongue -fever, flu-like symptoms -genital sores -high blood pressure -no menstrual period for 6 weeks during use -pain, swelling, warmth in the leg -pelvic pain or tenderness -severe or sudden headache -signs of pregnancy -stomach cramping -sudden shortness of breath -trouble with balance, talking, or walking -unusual vaginal bleeding, discharge -yellowing of the eyes or skin Side effects that usually do not require medical attention (report to your doctor or health care  professional if they continue or are bothersome): -acne -breast pain -change in sex drive or performance -changes in weight -cramping, dizziness, or faintness while the device is being inserted -headache -irregular menstrual bleeding within first 3 to 6 months of use -nausea This list may not describe all possible side effects. Call your doctor for medical advice about side effects. You may report side effects to FDA at 1-800-FDA-1088. Where should I keep my medicine? This does not apply. NOTE: This sheet is a summary. It may not cover all possible information. If you have questions about this medicine, talk to your doctor, pharmacist, or health care provider.  2012, [employer]. (03/13/2008 6:39:08 PM)

## 2012-05-30 ENCOUNTER — Emergency Department (HOSPITAL_BASED_OUTPATIENT_CLINIC_OR_DEPARTMENT_OTHER)
Admission: EM | Admit: 2012-05-30 | Discharge: 2012-05-30 | Disposition: A | Discharge status: Home / Self Care | Payer: Medicaid Other | Attending: Emergency Medicine | Admitting: Emergency Medicine

## 2012-05-30 ENCOUNTER — Emergency Department (HOSPITAL_BASED_OUTPATIENT_CLINIC_OR_DEPARTMENT_OTHER): Payer: Medicaid Other

## 2012-05-30 ENCOUNTER — Encounter (HOSPITAL_BASED_OUTPATIENT_CLINIC_OR_DEPARTMENT_OTHER): Payer: Self-pay | Admitting: *Deleted

## 2012-05-30 DIAGNOSIS — Z79899 Other long term (current) drug therapy: Secondary | ICD-10-CM | POA: Insufficient documentation

## 2012-05-30 DIAGNOSIS — Z8709 Personal history of other diseases of the respiratory system: Secondary | ICD-10-CM | POA: Insufficient documentation

## 2012-05-30 DIAGNOSIS — F17200 Nicotine dependence, unspecified, uncomplicated: Secondary | ICD-10-CM | POA: Insufficient documentation

## 2012-05-30 DIAGNOSIS — R0789 Other chest pain: Secondary | ICD-10-CM

## 2012-05-30 DIAGNOSIS — Z8669 Personal history of other diseases of the nervous system and sense organs: Secondary | ICD-10-CM | POA: Insufficient documentation

## 2012-05-30 DIAGNOSIS — F319 Bipolar disorder, unspecified: Secondary | ICD-10-CM | POA: Insufficient documentation

## 2012-05-30 DIAGNOSIS — Z8739 Personal history of other diseases of the musculoskeletal system and connective tissue: Secondary | ICD-10-CM | POA: Insufficient documentation

## 2012-05-30 DIAGNOSIS — R071 Chest pain on breathing: Secondary | ICD-10-CM | POA: Insufficient documentation

## 2012-05-30 DIAGNOSIS — R0602 Shortness of breath: Secondary | ICD-10-CM | POA: Insufficient documentation

## 2012-05-30 DIAGNOSIS — E669 Obesity, unspecified: Secondary | ICD-10-CM | POA: Insufficient documentation

## 2012-05-30 DIAGNOSIS — G43909 Migraine, unspecified, not intractable, without status migrainosus: Secondary | ICD-10-CM | POA: Insufficient documentation

## 2012-05-30 DIAGNOSIS — J45909 Unspecified asthma, uncomplicated: Secondary | ICD-10-CM | POA: Insufficient documentation

## 2012-05-30 MED ORDER — HYDROCODONE-ACETAMINOPHEN 7.5-325 MG/15ML PO SOLN
10.0000 mL | Freq: Once | ORAL | Status: AC
  Administered 2012-05-30: 10 mL via ORAL
  Filled 2012-05-30: qty 15

## 2012-05-30 NOTE — ED Notes (Signed)
Given to EDP Jacubowitz to review

## 2012-05-30 NOTE — ED Notes (Signed)
Pt reports she had a steroid shot on Monday for pharyngitis

## 2012-05-30 NOTE — ED Notes (Signed)
MD at bedside. 

## 2012-05-30 NOTE — ED Notes (Signed)
Pt has a ride

## 2012-05-30 NOTE — ED Provider Notes (Signed)
History     CSN: [id number]  Arrival date & time 05/30/12  2158   First MD Initiated Contact with Patient 05/30/12 2229      Chief Complaint  Patient presents with  . Back Pain    (Consider location/radiation/quality/duration/timing/severity/associated sxs/prior treatment) HPI Complains of right-sided back pain onset 2 days ago. Pain starts at low back and radiates to right sided posterior thoracic area and today has radiated to her right anterior chest pain is pleuritic in nature worse with changing positions and deep inspiration. Admits to mild shortness of breath. Denies cough denies fever. Treated with Tylenol, without relief. No other associated symptoms. Past Medical History  Diagnosis Date  . Migraine   . Depression   . Bipolar 1 disorder   . Otitis media   . Strep throat   . Asthma   . Fibromyalgia 04/2012    Past Surgical History  Procedure Laterality Date  . Tonsillectomy    . Cholecystectomy    . Exploratory laparotomy    . Tubal ligation    . Exploratory laparotomy    . Tympanostomy      Family History  Problem Relation Age of Onset  . Depression Mother     History  Substance Use Topics  . Smoking status: Current Every Day Smoker -- 1.00 packs/day    Types: Cigarettes  . Smokeless tobacco: Never Used  . Alcohol Use: No    OB History   Grav Para Term Preterm Abortions TAB SAB Ect Mult Living   6 3 2 1 3  3   3       Review of Systems  Constitutional: Negative.   HENT: Negative.   Respiratory: Positive for shortness of breath.   Cardiovascular: Positive for chest pain.  Gastrointestinal: Negative.   Musculoskeletal: Positive for back pain.  Skin: Negative.   Neurological: Negative.   Psychiatric/Behavioral: Negative.   All other systems reviewed and are negative.    Allergies  Morphine and related and Reglan  Home Medications   Current Outpatient Rx  Name  Route  Sig  Dispense  Refill  . ARIPiprazole (ABILIFY) 5 MG tablet   Oral    Take 5 mg by mouth daily.          Marland Kitchen aspirin-acetaminophen-caffeine (EXCEDRIN MIGRAINE) 250-250-65 MG per tablet   Oral   Take 2 tablets by mouth every 6 (six) hours as needed for pain.         . Aspirin-Acetaminophen-Caffeine (GOODY HEADACHE PO)   Oral   Take 1 packet by mouth once. Patient used this medication for pain.         . busPIRone (BUSPAR) 15 MG tablet   Oral   Take 15 mg by mouth 2 (two) times daily.         . cetirizine (ZYRTEC) 10 MG tablet   Oral   Take 10 mg by mouth daily. Patient uses this medication for her allergies.         . clonazePAM (KLONOPIN) 0.5 MG tablet   Oral   Take 0.5 mg by mouth 2 (two) times daily as needed.         . cyclobenzaprine (FLEXERIL) 10 MG tablet   Oral   Take 10 mg by mouth 2 (two) times daily as needed for muscle spasms.         Marland Kitchen desvenlafaxine (PRISTIQ) 50 MG 24 hr tablet   Oral   Take 100 mg by mouth daily.          Marland Kitchen  DULoxetine (CYMBALTA) 20 MG capsule   Oral   Take 60 mg by mouth daily.         Marland Kitchen gabapentin (NEURONTIN) 300 MG capsule   Oral   Take 300 mg by mouth 4 (four) times daily.         . hydrOXYzine (ATARAX/VISTARIL) 25 MG tablet   Oral   Take 25 mg by mouth 2 (two) times daily.         . pregabalin (LYRICA) 75 MG capsule   Oral   Take 75 mg by mouth 2 (two) times daily.         . promethazine (PHENERGAN) 25 MG tablet   Oral   Take 25 mg by mouth every 6 (six) hours as needed.         . topiramate (TOPAMAX) 25 MG tablet   Oral   Take 75 mg by mouth 2 (two) times daily.            BP 119/72  Pulse 89  Temp(Src) 98.5 F (36.9 C) (Oral)  Resp 20  Wt 189 lb (85.73 kg)  BMI 35.14 kg/m2  SpO2 100%  LMP 04/26/2012  Physical Exam  Nursing note and vitals reviewed. Constitutional: She is oriented to person, place, and time. She appears well-developed and well-nourished. She appears distressed.  Appears uncomfortable no respiratory distress speaks in paragraphs  HENT:   Head: Normocephalic and atraumatic.  Eyes: Conjunctivae are normal. Pupils are equal, round, and reactive to light.  Neck: Normal range of motion. Neck supple. No tracheal deviation present. No thyromegaly present.  Cardiovascular: Normal rate and regular rhythm.  Exam reveals no friction rub.   No murmur heard. Pulmonary/Chest: Effort normal and breath sounds normal. She exhibits tenderness.  Right anterior chest exquisitely tender. Pain is reproducible by forcible abduction of right shoulder  Abdominal: Soft. Bowel sounds are normal. She exhibits no distension. There is no tenderness.  Mildly obese  Musculoskeletal: Normal range of motion. She exhibits no edema and no tenderness.  Neurological: She is alert and oriented to person, place, and time. Coordination normal.  Skin: Skin is warm and dry. No rash noted.  Psychiatric:  anxious    ED Course  Procedures (including critical care time)  Labs Reviewed - No data to display No results found.  Date: 05/30/2012  Rate: 75  Rhythm: normal sinus rhythm  QRS Axis: normal  Intervals: normal  ST/T Wave abnormalities: Nonspecific T wave changes inferiorly leads  Conduction Disutrbances: none  Narrative Interpretation: unremarkable  No old EKG for comparison   No diagnosis found. Results for orders placed during the hospital encounter of 05/30/12  D-DIMER, QUANTITATIVE      Result Value Range   D-Dimer, Quant 0.33  0.00 - 0.48 ug/mL-FEU   Dg Chest 2 View  05/30/2012  *RADIOLOGY REPORT*  Clinical Data: Right-sided chest pain.  History of fibromyalgia.  CHEST - 2 VIEW  Comparison: 05/27/2012.  Findings: The right lung is clear.  Scarring or atelectasis is present in the left lung base, which on the lateral view appears to be in the lingula and anterior left lower lobe.  This is unchanged compared to the recent prior. Cholecystectomy clips are present in the right upper quadrant.  There is no airspace disease.  No pleural effusion.   Trachea midline.  Mediastinal contours normal.  IMPRESSION: No interval change or acute cardiopulmonary disease.  Left basilar atelectasis and / or scarring.  Findings discussed with Dr. Doug Sou at the time of  interpretation.   Original Report Authenticated By: Andreas Newport, M.D.     Chest x-ray viewed by me and discussed with radiologist, Dr Carlota Raspberry. No acute findings, lingula scarring left side. 11:20 PM pain much improved after treatment with Norco.  MDM  Assessment pain felt to be musculoskeletal in etiology. Pretest clinical probability of pulmonary embolism is low. Negative d-dimer. Strongly doubt acute coronary syndrome with highly atypical symptoms and pain is easily reproducible, pleuritic and right-sided Plan Tylenol as needed for pain. No prescriptions written. Smoking cessation encouraged. Spent 5 minutes counseling patient on smoking cessation Diagnosis #1 chest wall pain #2 back pain #3 tobacco abuse        Doug Sou, MD 05/30/12 [phone]

## 2012-05-30 NOTE — ED Notes (Signed)
Back pain since noon. Pain in her right chest x 2 days. Denies cough. Difficulty breathing since this evening.

## 2012-06-08 ENCOUNTER — Encounter (HOSPITAL_BASED_OUTPATIENT_CLINIC_OR_DEPARTMENT_OTHER): Payer: Self-pay

## 2012-06-08 ENCOUNTER — Emergency Department (HOSPITAL_BASED_OUTPATIENT_CLINIC_OR_DEPARTMENT_OTHER)
Admission: EM | Admit: 2012-06-08 | Discharge: 2012-06-08 | Disposition: A | Discharge status: Home / Self Care | Payer: Medicaid Other | Attending: Emergency Medicine | Admitting: Emergency Medicine

## 2012-06-08 DIAGNOSIS — F319 Bipolar disorder, unspecified: Secondary | ICD-10-CM | POA: Insufficient documentation

## 2012-06-08 DIAGNOSIS — J45909 Unspecified asthma, uncomplicated: Secondary | ICD-10-CM | POA: Insufficient documentation

## 2012-06-08 DIAGNOSIS — F17200 Nicotine dependence, unspecified, uncomplicated: Secondary | ICD-10-CM | POA: Insufficient documentation

## 2012-06-08 DIAGNOSIS — IMO0002 Reserved for concepts with insufficient information to code with codable children: Secondary | ICD-10-CM

## 2012-06-08 DIAGNOSIS — R110 Nausea: Secondary | ICD-10-CM | POA: Insufficient documentation

## 2012-06-08 DIAGNOSIS — G43909 Migraine, unspecified, not intractable, without status migrainosus: Secondary | ICD-10-CM | POA: Insufficient documentation

## 2012-06-08 DIAGNOSIS — H53149 Visual discomfort, unspecified: Secondary | ICD-10-CM | POA: Insufficient documentation

## 2012-06-08 DIAGNOSIS — Z8669 Personal history of other diseases of the nervous system and sense organs: Secondary | ICD-10-CM | POA: Insufficient documentation

## 2012-06-08 DIAGNOSIS — Z79899 Other long term (current) drug therapy: Secondary | ICD-10-CM | POA: Insufficient documentation

## 2012-06-08 DIAGNOSIS — Z8739 Personal history of other diseases of the musculoskeletal system and connective tissue: Secondary | ICD-10-CM | POA: Insufficient documentation

## 2012-06-08 DIAGNOSIS — G43709 Chronic migraine without aura, not intractable, without status migrainosus: Secondary | ICD-10-CM

## 2012-06-08 DIAGNOSIS — Z8709 Personal history of other diseases of the respiratory system: Secondary | ICD-10-CM | POA: Insufficient documentation

## 2012-06-08 MED ORDER — ONDANSETRON HCL 4 MG/2ML IJ SOLN
4.0000 mg | Freq: Once | INTRAMUSCULAR | Status: AC
  Administered 2012-06-08: 4 mg via INTRAVENOUS
  Filled 2012-06-08: qty 2

## 2012-06-08 MED ORDER — SODIUM CHLORIDE 0.9 % IV BOLUS (SEPSIS)
1000.0000 mL | Freq: Once | INTRAVENOUS | Status: AC
  Administered 2012-06-08: 1000 mL via INTRAVENOUS

## 2012-06-08 MED ORDER — KETOROLAC TROMETHAMINE 30 MG/ML IJ SOLN
30.0000 mg | Freq: Once | INTRAMUSCULAR | Status: AC
  Administered 2012-06-08: 30 mg via INTRAVENOUS
  Filled 2012-06-08: qty 1

## 2012-06-08 MED ORDER — DIPHENHYDRAMINE HCL 50 MG/ML IJ SOLN
25.0000 mg | Freq: Once | INTRAMUSCULAR | Status: AC
  Administered 2012-06-08: 25 mg via INTRAVENOUS
  Filled 2012-06-08: qty 1

## 2012-06-08 MED ORDER — DEXAMETHASONE SODIUM PHOSPHATE 10 MG/ML IJ SOLN
10.0000 mg | Freq: Once | INTRAMUSCULAR | Status: DC
  Filled 2012-06-08: qty 1

## 2012-06-08 NOTE — ED Notes (Signed)
Pt states that she woke up with severe headache, nausea, photophobia.  Pt states that she has not vomited at present time.  Pt states that she has an appointment for the [facility] Pain management for chronic migraine management and also management of fibromyalgia.

## 2012-06-08 NOTE — ED Provider Notes (Signed)
History    This chart was scribed for Tiffany Whitney B. Bernette [name], MD scribed by Magnus Sinning. The patient was seen in room MH05/MH05 at 15:11   CSN: [id number]  Arrival date & time 06/08/12  1358      Chief Complaint  Patient presents with  . Migraine    (Consider location/radiation/quality/duration/timing/severity/associated sxs/prior treatment) Patient is a 32 y.o. female presenting with migraines. The history is provided by the patient. No language interpreter was used.  Migraine This is a chronic problem. The current episode started 6 to 12 hours ago. The problem occurs constantly. The problem has not changed since onset.Associated symptoms include headaches.   Tiffany Whitney is a 32 y.o. female who presents to the Emergency Department complaining of approximately 9 hours of right-sided constant severe HA with associated photophobia, and nausea. The patient denies any vomiting and reports she woke up with this HA. She provides it is similar to hx of HA's and reports that she will go to the pain management clinic on the 06/17/12  to be treated for migraines. Pt provides that she also has hx of fibromyalgia, which she also with be treating through pain management clinic. Records indicate that the patient was also seen in the ED for similar sxs on 05/06/12 and 05/17/12.  Past Medical History  Diagnosis Date  . Migraine   . Depression   . Bipolar 1 disorder   . Otitis media   . Strep throat   . Asthma   . Fibromyalgia 04/2012    Past Surgical History  Procedure Laterality Date  . Tonsillectomy    . Cholecystectomy    . Exploratory laparotomy    . Tubal ligation    . Exploratory laparotomy    . Tympanostomy      Family History  Problem Relation Age of Onset  . Depression Mother     History  Substance Use Topics  . Smoking status: Current Every Day Smoker -- 1.00 packs/day    Types: Cigarettes  . Smokeless tobacco: Never Used  . Alcohol Use: No    OB History   Grav Para Term  Preterm Abortions TAB SAB Ect Mult Living   6 3 2 1 3  3   3       Review of Systems  Eyes: Positive for photophobia.  Gastrointestinal: Positive for nausea. Negative for vomiting and diarrhea.  Neurological: Positive for headaches.  All other systems reviewed and are negative.    Allergies  Morphine and related and Reglan  Home Medications   Current Outpatient Rx  Name  Route  Sig  Dispense  Refill  . ARIPiprazole (ABILIFY) 5 MG tablet   Oral   Take 5 mg by mouth daily.          . busPIRone (BUSPAR) 15 MG tablet   Oral   Take 15 mg by mouth 2 (two) times daily.         . cetirizine (ZYRTEC) 10 MG tablet   Oral   Take 10 mg by mouth daily. Patient uses this medication for her allergies.         . cyclobenzaprine (FLEXERIL) 10 MG tablet   Oral   Take 10 mg by mouth 2 (two) times daily as needed for muscle spasms.         . DULoxetine (CYMBALTA) 20 MG capsule   Oral   Take 60 mg by mouth daily.         Marland Kitchen gabapentin (NEURONTIN) 300 MG capsule   Oral  Take 600 mg by mouth 2 (two) times daily.          Marland Kitchen gabapentin (NEURONTIN) 300 MG capsule   Oral   Take 900 mg by mouth at bedtime.         . hydrOXYzine (ATARAX/VISTARIL) 25 MG tablet   Oral   Take 25 mg by mouth 2 (two) times daily.         Marland Kitchen topiramate (TOPAMAX) 25 MG tablet   Oral   Take 75 mg by mouth 2 (two) times daily.          Marland Kitchen aspirin-acetaminophen-caffeine (EXCEDRIN MIGRAINE) 250-250-65 MG per tablet   Oral   Take 2 tablets by mouth every 6 (six) hours as needed for pain.         . Aspirin-Acetaminophen-Caffeine (GOODY HEADACHE PO)   Oral   Take 1 packet by mouth once. Patient used this medication for pain.         . clonazePAM (KLONOPIN) 0.5 MG tablet   Oral   Take 0.5 mg by mouth 2 (two) times daily as needed.         . desvenlafaxine (PRISTIQ) 50 MG 24 hr tablet   Oral   Take 100 mg by mouth daily.          . pregabalin (LYRICA) 75 MG capsule   Oral   Take 75  mg by mouth 2 (two) times daily.         . promethazine (PHENERGAN) 25 MG tablet   Oral   Take 25 mg by mouth every 6 (six) hours as needed.           BP 123/71  Pulse 96  Temp(Src) 98.8 F (37.1 C) (Oral)  Resp 16  Ht 5\' 1"  (1.549 m)  Wt 182 lb (82.555 kg)  BMI 34.41 kg/m2  SpO2 100%  LMP 05/24/2012  Physical Exam  Nursing note and vitals reviewed. Constitutional: She is oriented to person, place, and time. She appears well-developed and well-nourished.  HENT:  Head: Normocephalic and atraumatic.  Eyes: EOM are normal. Pupils are equal, round, and reactive to light.  Neck: Normal range of motion. Neck supple.  Cardiovascular: Normal rate, normal heart sounds and intact distal pulses.   Pulmonary/Chest: Effort normal and breath sounds normal.  Abdominal: Bowel sounds are normal. She exhibits no distension. There is no tenderness.  Musculoskeletal: Normal range of motion. She exhibits no edema and no tenderness.  Neurological: She is alert and oriented to person, place, and time. She has normal strength. No cranial nerve deficit or sensory deficit.  Skin: Skin is warm and dry. No rash noted.  Psychiatric: She has a normal mood and affect.    ED Course  Procedures (including critical care time) DIAGNOSTIC STUDIES: Oxygen Saturation is 100% on room air, normal by my interpretation.    COORDINATION OF CARE:  Labs Reviewed - No data to display No results found.   1. Chronic migraine       MDM  Chronic migraines, pain resolved,ready to go home.    I personally performed the services described in this documentation, which was scribed in my presence. The recorded information has been reviewed and is accurate.         Reiner Loewen B. Bernette [name], MD 06/08/12 [phone]

## 2012-06-20 ENCOUNTER — Other Ambulatory Visit: Payer: Self-pay | Admitting: Obstetrics & Gynecology

## 2012-06-20 ENCOUNTER — Encounter: Payer: Self-pay | Admitting: Obstetrics & Gynecology

## 2012-06-20 ENCOUNTER — Ambulatory Visit (INDEPENDENT_AMBULATORY_CARE_PROVIDER_SITE_OTHER): Payer: Medicaid Other | Admitting: Obstetrics & Gynecology

## 2012-06-20 VITALS — BP 112/81 | HR 88 | Temp 98.4°F | Ht 61.5 in | Wt 187.9 lb

## 2012-06-20 DIAGNOSIS — Z30430 Encounter for insertion of intrauterine contraceptive device: Secondary | ICD-10-CM

## 2012-06-20 DIAGNOSIS — N946 Dysmenorrhea, unspecified: Secondary | ICD-10-CM

## 2012-06-20 DIAGNOSIS — Z975 Presence of (intrauterine) contraceptive device: Secondary | ICD-10-CM

## 2012-06-20 DIAGNOSIS — N949 Unspecified condition associated with female genital organs and menstrual cycle: Secondary | ICD-10-CM

## 2012-06-20 MED ORDER — NAPROXEN 500 MG PO TABS: 500.0000 mg | ORAL_TABLET | Freq: Two times a day (BID) | ORAL | Status: DC

## 2012-06-20 MED ORDER — HYDROCODONE-ACETAMINOPHEN 5-325 MG PO TABS: 1.0000 | ORAL_TABLET | Freq: Four times a day (QID) | ORAL | Status: DC | PRN

## 2012-06-20 MED ORDER — LEVONORGESTREL 20 MCG/24HR IU IUD
INTRAUTERINE_SYSTEM | Freq: Once | INTRAUTERINE | Status: AC
  Administered 2012-06-20: 1 via INTRAUTERINE

## 2012-06-20 NOTE — Patient Instructions (Addendum)

## 2012-06-20 NOTE — Progress Notes (Signed)
Patient ID: Tiffany Whitney, female   DOB: April 07, 1980, 32 y.o.   MRN: [id number] Patient identified, informed consent performed.  Discussed risks of irregular bleeding, cramping, infection, malpositioning or misplacement of the IUD outside the uterus which may require further procedures. Time out was performed.  Urine pregnancy test negative.  Speculum placed in the vagina.  Cervix visualized.  Cleaned with Betadine x 2. Hurricaine spray applied to ant lip of cervix.  Cervix grasped anteriorly with a single tooth tenaculum.  Uterus sounded to 9 cm.  Mirena IUD placed per manufacturer's recommendations.  Strings trimmed to 3 cm. Tenaculum was removed, good hemostasis noted.  Patient tolerated procedure well.   Patient was given post-procedure instructions and the Mirena care card with expiration date.  Patient was also asked to check IUD strings periodically and follow up in 4-6 weeks for IUD check.  Pt given rx for Naproxen and Vicodin

## 2012-06-21 ENCOUNTER — Emergency Department (HOSPITAL_BASED_OUTPATIENT_CLINIC_OR_DEPARTMENT_OTHER)
Admission: EM | Admit: 2012-06-21 | Discharge: 2012-06-21 | Disposition: A | Discharge status: Home / Self Care | Payer: Medicaid Other | Attending: Emergency Medicine | Admitting: Emergency Medicine

## 2012-06-21 ENCOUNTER — Encounter (HOSPITAL_BASED_OUTPATIENT_CLINIC_OR_DEPARTMENT_OTHER): Payer: Self-pay

## 2012-06-21 DIAGNOSIS — F319 Bipolar disorder, unspecified: Secondary | ICD-10-CM | POA: Insufficient documentation

## 2012-06-21 DIAGNOSIS — R102 Pelvic and perineal pain: Secondary | ICD-10-CM

## 2012-06-21 DIAGNOSIS — Z9851 Tubal ligation status: Secondary | ICD-10-CM | POA: Insufficient documentation

## 2012-06-21 DIAGNOSIS — F17200 Nicotine dependence, unspecified, uncomplicated: Secondary | ICD-10-CM | POA: Insufficient documentation

## 2012-06-21 DIAGNOSIS — G43909 Migraine, unspecified, not intractable, without status migrainosus: Secondary | ICD-10-CM | POA: Insufficient documentation

## 2012-06-21 DIAGNOSIS — Z8619 Personal history of other infectious and parasitic diseases: Secondary | ICD-10-CM | POA: Insufficient documentation

## 2012-06-21 DIAGNOSIS — N949 Unspecified condition associated with female genital organs and menstrual cycle: Secondary | ICD-10-CM | POA: Insufficient documentation

## 2012-06-21 DIAGNOSIS — J45909 Unspecified asthma, uncomplicated: Secondary | ICD-10-CM | POA: Insufficient documentation

## 2012-06-21 DIAGNOSIS — Z8669 Personal history of other diseases of the nervous system and sense organs: Secondary | ICD-10-CM | POA: Insufficient documentation

## 2012-06-21 DIAGNOSIS — IMO0001 Reserved for inherently not codable concepts without codable children: Secondary | ICD-10-CM | POA: Insufficient documentation

## 2012-06-21 NOTE — ED Notes (Signed)
MD at bedside. 

## 2012-06-21 NOTE — ED Provider Notes (Signed)
History     CSN: [id number]  Arrival date & time 06/21/12  1414   First MD Initiated Contact with Patient 06/21/12 1527      Chief Complaint  Patient presents with  . Vaginal Pain    (Consider location/radiation/quality/duration/timing/severity/associated sxs/prior treatment) Patient is a 32 y.o. female presenting with female genitourinary complaint. The history is provided by the patient.  Female GU Problem Primary symptoms include pelvic pain. Primary symptoms comment: Patient had a Mirena IUD inserted yesterday at [facility] hospital for menorrhagia. She has had severe pain since the insertion, not controlled by ibuprofen. She cannot take narcotic pain medicines because she is on a pain contract with a pain management spec. There has been no fever. This is a new problem. The current episode started yesterday (She called [facility] clinic, spoke with a nurse, who advised her to come to [facility] ED to have the IUD removed.). The problem occurs constantly. The problem has not changed since onset.She is not pregnant. She has tried NSAIDs for the symptoms. The treatment provided no relief. She uses an IUD for contraception. Associated medical issues include hypermenorrhea.    Past Medical History  Diagnosis Date  . Migraine   . Depression   . Bipolar 1 disorder   . Otitis media   . Strep throat   . Asthma   . Fibromyalgia 04/2012    Past Surgical History  Procedure Laterality Date  . Tonsillectomy    . Cholecystectomy    . Exploratory laparotomy    . Tubal ligation    . Exploratory laparotomy    . Tympanostomy      Family History  Problem Relation Age of Onset  . Depression Mother     History  Substance Use Topics  . Smoking status: Current Every Day Smoker -- 1.00 packs/day    Types: Cigarettes  . Smokeless tobacco: Never Used  . Alcohol Use: No    OB History   Grav Para Term Preterm Abortions TAB SAB Ect Mult Living   6 3 2 1 3  3   3        Review of Systems  Constitutional: Negative for fever and chills.  HENT: Negative.   Eyes: Negative.   Respiratory: Negative.   Cardiovascular: Negative.   Gastrointestinal: Negative.   Genitourinary: Positive for vaginal pain and pelvic pain.       Pelvic pain post IUD insertion.  Musculoskeletal: Negative.   Skin: Negative.   Neurological: Negative.   Psychiatric/Behavioral: Negative.     Allergies  Morphine and related and Reglan  Home Medications   Current Outpatient Rx  Name  Route  Sig  Dispense  Refill  . ARIPiprazole (ABILIFY) 5 MG tablet   Oral   Take 5 mg by mouth daily.          Marland Kitchen aspirin-acetaminophen-caffeine (EXCEDRIN MIGRAINE) 250-250-65 MG per tablet   Oral   Take 2 tablets by mouth every 6 (six) hours as needed for pain.         . Aspirin-Acetaminophen-Caffeine (GOODY HEADACHE PO)   Oral   Take 1 packet by mouth once. Patient used this medication for pain.         . busPIRone (BUSPAR) 15 MG tablet   Oral   Take 15 mg by mouth 2 (two) times daily.         . cetirizine (ZYRTEC) 10 MG tablet   Oral   Take 10 mg by mouth daily. Patient uses this medication  for her allergies.         . clonazePAM (KLONOPIN) 0.5 MG tablet   Oral   Take 0.5 mg by mouth 2 (two) times daily as needed.         . cyclobenzaprine (FLEXERIL) 10 MG tablet   Oral   Take 10 mg by mouth 2 (two) times daily as needed for muscle spasms.         Marland Kitchen desvenlafaxine (PRISTIQ) 50 MG 24 hr tablet   Oral   Take 100 mg by mouth daily.          . DULoxetine (CYMBALTA) 20 MG capsule   Oral   Take 60 mg by mouth daily.         Marland Kitchen gabapentin (NEURONTIN) 300 MG capsule   Oral   Take 600 mg by mouth 2 (two) times daily.          Marland Kitchen gabapentin (NEURONTIN) 300 MG capsule   Oral   Take 900 mg by mouth at bedtime.         Marland Kitchen HYDROcodone-acetaminophen (NORCO/VICODIN) 5-325 MG per tablet   Oral   Take 1 tablet by mouth every 6 (six) hours as needed for pain.   30  tablet   0   . hydrOXYzine (ATARAX/VISTARIL) 25 MG tablet   Oral   Take 25 mg by mouth 2 (two) times daily.         . naproxen (NAPROSYN) 500 MG tablet   Oral   Take 1 tablet (500 mg total) by mouth 2 (two) times daily with a meal.   60 tablet   1   . pregabalin (LYRICA) 75 MG capsule   Oral   Take 75 mg by mouth 2 (two) times daily.         . promethazine (PHENERGAN) 25 MG tablet   Oral   Take 25 mg by mouth every 6 (six) hours as needed.         . topiramate (TOPAMAX) 25 MG tablet   Oral   Take 75 mg by mouth 2 (two) times daily.            BP 128/83  Pulse 94  Temp(Src) 98.9 F (37.2 C) (Oral)  Resp 20  Ht 5\' 1"  (1.549 m)  Wt 187 lb (84.823 kg)  BMI 35.35 kg/m2  SpO2 99%  LMP 05/24/2012  Physical Exam  Constitutional: She is oriented to person, place, and time. She appears well-developed and well-nourished. Distressed: Appears to be in mild to moderate distress with suprapubic pain.  Abdominal: Soft. She exhibits no distension. There is no tenderness.  Genitourinary:  No vaginal bleeding.  Uterus small and nontender.  Musculoskeletal: Normal range of motion. She exhibits no edema and no tenderness.  Neurological: She is alert and oriented to person, place, and time.  No sensory or motor deficit.  Skin: Skin is warm and dry.  Psychiatric: She has a normal mood and affect. Her behavior is normal.    ED Course  Procedures (including critical care time)  3:59 PM IUD removed by Langston Masker, P.A.-C.  Pt advised that she could have persistant bleeding or persistant pelvic pain.    1. Pelvic pain         Carleene Cooper III, MD 06/21/12 [phone]

## 2012-06-21 NOTE — ED Notes (Signed)
Pt states that she had IUD inserted yesterday at [facility] hospital clinic and has began experiencing severe lower abdominal/uternine pain.  Pt states that she contacted the clinic and they told her to come to [facility] to have it removed.

## 2012-06-23 ENCOUNTER — Inpatient Hospital Stay (HOSPITAL_COMMUNITY)
Admission: AD | Admit: 2012-06-23 | Discharge: 2012-06-23 | Disposition: A | Discharge status: Home / Self Care | Payer: Medicaid Other | Source: Ambulatory Visit | Attending: Family Medicine | Admitting: Family Medicine

## 2012-06-23 DIAGNOSIS — IMO0001 Reserved for inherently not codable concepts without codable children: Secondary | ICD-10-CM | POA: Insufficient documentation

## 2012-06-23 DIAGNOSIS — N920 Excessive and frequent menstruation with regular cycle: Secondary | ICD-10-CM

## 2012-06-23 DIAGNOSIS — F17200 Nicotine dependence, unspecified, uncomplicated: Secondary | ICD-10-CM | POA: Insufficient documentation

## 2012-06-23 DIAGNOSIS — N946 Dysmenorrhea, unspecified: Secondary | ICD-10-CM | POA: Insufficient documentation

## 2012-06-23 LAB — URINALYSIS, ROUTINE W REFLEX MICROSCOPIC
Bilirubin Urine: NEGATIVE
Specific Gravity, Urine: 1.01 (ref 1.005–1.030)
pH: 6 (ref 5.0–8.0)

## 2012-06-23 LAB — URINE MICROSCOPIC-ADD ON

## 2012-06-23 MED ORDER — MEDROXYPROGESTERONE ACETATE 10 MG PO TABS: 10.0000 mg | ORAL_TABLET | Freq: Every day | ORAL | Status: DC

## 2012-06-23 NOTE — MAU Note (Signed)
Patient presents to MAU with c/o vaginal bleeding after IUD insertion on Thursday then removal on Friday d/t pain. Reports LMP 3/15.

## 2012-06-23 NOTE — MAU Note (Signed)
Pt presents to MAU with complaints of heavy vaginal bleeding since IUD removal. States the IUD was placed on Thursday and was causing so much pain that it was removed on Friday and she had had bleeding since.

## 2012-06-23 NOTE — MAU Provider Note (Signed)
CC: Vaginal Bleeding    First Provider Initiated Contact with Patient 06/23/12 1128      HPI Tiffany Whitney is a 32 y.o. [id number] who presents with heavy bleeding, using 1 pad/hr today. No orthostatic sx.  She had Mirena inserted at [facility] 06/20/12 for menorrhagia and dysmenorrhe; it wasremoved 06/20/12 at [facility] due to pelvic pain. Since removal she has had heavy bleeding with clots and continues to have left pelvic pain (which started during IUD insertion). Has not taken pain med due to being in pain clinic for fibromyalgia.  LMP 05/24/12.  Past Medical History  Diagnosis Date  . Migraine   . Depression   . Bipolar 1 disorder   . Otitis media   . Strep throat   . Asthma   . Fibromyalgia 04/2012    OB History   Grav Para Term Preterm Abortions TAB SAB Ect Mult Living   6 3 2 1 3  3   3      # Outc Date GA Lbr Len/2nd Wgt Sex Del Anes PTL Lv   1 TRM      SVD      2 TRM      SVD      3 SAB            4 SAB            5 SAB            6 PRE      SVD         Past Surgical History  Procedure Laterality Date  . Tonsillectomy    . Cholecystectomy    . Exploratory laparotomy    . Tubal ligation    . Exploratory laparotomy    . Tympanostomy      History   Social History  . Marital Status: Married    Spouse Name: N/A    Number of Children: N/A  . Years of Education: N/A   Occupational History  . Not on file.   Social History Main Topics  . Smoking status: Current Every Day Smoker -- 1.00 packs/day    Types: Cigarettes  . Smokeless tobacco: Never Used  . Alcohol Use: No  . Drug Use: No  . Sexually Active: Yes    Birth Control/ Protection: Surgical   Other Topics Concern  . Not on file   Social History Narrative  . No narrative on file    No current facility-administered medications on file prior to encounter.   Current Outpatient Prescriptions on File Prior to Encounter  Medication Sig Dispense Refill  . ARIPiprazole (ABILIFY) 5 MG tablet  Take 5 mg by mouth daily.       . Aspirin-Acetaminophen-Caffeine (GOODY HEADACHE PO) Take 1 packet by mouth daily as needed.       . busPIRone (BUSPAR) 15 MG tablet Take 15 mg by mouth 2 (two) times daily.      . cetirizine (ZYRTEC) 10 MG tablet Take 10 mg by mouth daily.       . cyclobenzaprine (FLEXERIL) 10 MG tablet Take 10 mg by mouth 2 (two) times daily as needed for muscle spasms.      Marland Kitchen gabapentin (NEURONTIN) 300 MG capsule Take 600 mg by mouth 3 (three) times daily.       . hydrOXYzine (ATARAX/VISTARIL) 25 MG tablet Take 50 mg by mouth at bedtime.       . topiramate (TOPAMAX) 25 MG tablet Take 75 mg by mouth  2 (two) times daily.         Allergies  Allergen Reactions  . Morphine And Related Hives  . Reglan (Metoclopramide)     jittery    ROS Pertinent items in HPI  PHYSICAL EXAM Filed Vitals:   06/23/12 1041  BP: 134/66  Pulse: 82  Temp: 97.5 F (36.4 C)  Resp: 18   General: Well nourished, well developed female in no acute distress Cardiovascular: Normal rate Respiratory: Normal effort Abdomen: Soft, nontender Back: No CVAT Extremities: No edema Neurologic: Alert and oriented Speculum exam: NEFG; vagina with physiologic discharge, small-moderate amount blood swabbed from cx and vault with minimal active bleeding, no clots;; cervix clean Bimanual exam: cervix closed, no CMT; uterus NSSP; no adnexal tenderness or masses  LAB RESULTS Results for orders placed during the hospital encounter of 06/23/12 (from the past 24 hour(s))  URINALYSIS, ROUTINE W REFLEX MICROSCOPIC     Status: Abnormal   Collection Time    06/23/12 10:52 AM      Result Value Range   Color, Urine YELLOW  YELLOW   APPearance CLEAR  CLEAR   Specific Gravity, Urine 1.010  1.005 - 1.030   pH 6.0  5.0 - 8.0   Glucose, UA NEGATIVE  NEGATIVE mg/dL   Hgb urine dipstick LARGE (*) NEGATIVE   Bilirubin Urine NEGATIVE  NEGATIVE   Ketones, ur NEGATIVE  NEGATIVE mg/dL   Protein, ur NEGATIVE  NEGATIVE  mg/dL   Urobilinogen, UA 0.2  0.0 - 1.0 mg/dL   Nitrite NEGATIVE  NEGATIVE   Leukocytes, UA NEGATIVE  NEGATIVE  URINE MICROSCOPIC-ADD ON     Status: Abnormal   Collection Time    06/23/12 10:52 AM      Result Value Range   Squamous Epithelial / LPF FEW (*) RARE   WBC, UA 0-2  <3 WBC/hpf   RBC / HPF TOO NUMEROUS TO COUNT  <3 RBC/hpf    IMAU COURSE C/W Dr. Shawnie Pons Rx Provera and F/U at GYN Clinic  ASSESSMENT  1. Menorrhagia   Dysmenorrhea Fibromyalgia Smoker  PLAN Discharge home. See AVS for patient education.    Medication List    STOP taking these medications       GOODY HEADACHE PO      TAKE these medications       ARIPiprazole 5 MG tablet  Commonly known as:  ABILIFY  Take 5 mg by mouth daily.     busPIRone 15 MG tablet  Commonly known as:  BUSPAR  Take 15 mg by mouth 2 (two) times daily.     cetirizine 10 MG tablet  Commonly known as:  ZYRTEC  Take 10 mg by mouth daily.     cyclobenzaprine 10 MG tablet  Commonly known as:  FLEXERIL  Take 10 mg by mouth 2 (two) times daily as needed for muscle spasms.     DULoxetine 60 MG capsule  Commonly known as:  CYMBALTA  Take 60 mg by mouth daily.     gabapentin 300 MG capsule  Commonly known as:  NEURONTIN  Take 600 mg by mouth 3 (three) times daily.     hydrOXYzine 25 MG tablet  Commonly known as:  ATARAX/VISTARIL  Take 50 mg by mouth at bedtime.     medroxyPROGESTERone 10 MG tablet  Commonly known as:  PROVERA  Take 1 tablet (10 mg total) by mouth daily.     topiramate 25 MG tablet  Commonly known as:  TOPAMAX  Take 75 mg by mouth  2 (two) times daily.       Discussed with patient that this may be a menstrual period. Keep bleeding calendar to bring to next visit. Bleeding precautioins reviewed.  Follow-up Information   Follow up with [facility] In 4 weeks. (Someone from [facility] will call with an appointment)    Contact information:   [phone]        Danae Orleans,  CNM 06/23/2012 11:53 AM

## 2012-06-23 NOTE — MAU Provider Note (Signed)
Chart reviewed and agree with management and plan.  

## 2012-06-24 ENCOUNTER — Encounter (HOSPITAL_COMMUNITY): Payer: Self-pay | Admitting: *Deleted

## 2012-06-24 ENCOUNTER — Telehealth: Payer: Self-pay | Admitting: General Practice

## 2012-06-24 ENCOUNTER — Inpatient Hospital Stay (HOSPITAL_COMMUNITY)
Admission: AD | Admit: 2012-06-24 | Discharge: 2012-06-24 | Disposition: A | Discharge status: Home / Self Care | Payer: Medicaid Other | Source: Ambulatory Visit | Attending: Obstetrics and Gynecology | Admitting: Obstetrics and Gynecology

## 2012-06-24 DIAGNOSIS — N946 Dysmenorrhea, unspecified: Secondary | ICD-10-CM | POA: Insufficient documentation

## 2012-06-24 LAB — CBC
Hemoglobin: 13.9 g/dL (ref 12.0–15.0)
MCHC: 35 g/dL (ref 30.0–36.0)
RBC: 4.5 MIL/uL (ref 3.87–5.11)
WBC: 15.1 10*3/uL — ABNORMAL HIGH (ref 4.0–10.5)

## 2012-06-24 MED ORDER — MEDROXYPROGESTERONE ACETATE 10 MG PO TABS: 20.0000 mg | ORAL_TABLET | Freq: Every day | ORAL | Status: DC

## 2012-06-24 MED ORDER — IBUPROFEN 800 MG PO TABS: 800.0000 mg | ORAL_TABLET | Freq: Three times a day (TID) | ORAL | Status: DC | PRN

## 2012-06-24 MED ORDER — KETOROLAC TROMETHAMINE 60 MG/2ML IM SOLN
60.0000 mg | Freq: Once | INTRAMUSCULAR | Status: AC
  Administered 2012-06-24: 60 mg via INTRAMUSCULAR
  Filled 2012-06-24: qty 2

## 2012-06-24 NOTE — Telephone Encounter (Signed)
Patient called and left message stating she had an IUD put in on Thursday and was experiencing more than just cramps but excruciating pain and went to the ER on Friday and had the IUD removed because she was hurting so bad, but since the IUD has been put in she's been experiencing heavy bleeding and passing clots and she needs to know what to do (Patient did come back into ER last night). Called patient and informed her that I received her message about heavy bleeding and clots. Patient states she feels like things have got worse since taking the Provera and now she's feeling dizzy and lightheaded from all the bleeding. Told patient that the provera isn't an instance fix for bleeding and that it takes time to work and since her LMP was the 3/21 it's very possible she could be having her period right now as well and there isn't anything we can do to stop her period or slow it down immediately. Also told patient if she is feeling bad enough from the dizziness and lightheadedness that she should go to the ER. Patient verbalized understanding and had no further questions

## 2012-06-24 NOTE — MAU Note (Addendum)
Had IUP placed Thursday at [facility] clinic, went to [facility] ED on Friday for removal due to pain, seen in MAU on Sunday with bleeding, given Provera and bleeding continues today x 5 pads, pad worn in had smear of bright red blood, triage in hallway due to no room and triage full.  Vials stable, to lobby via wheelchair and helped to chair, instructed pt not to get up without help due to feeling dizzy LMP March 15th,

## 2012-06-24 NOTE — MAU Provider Note (Signed)
Attestation of Attending Supervision of Advanced Practitioner (CNM/NP): Evaluation and management procedures were performed by the Advanced Practitioner under my supervision and collaboration.  I have reviewed the Advanced Practitioner's note and chart, and I agree with the management and plan.  Tareva Leske 06/24/2012 7:57 PM

## 2012-06-24 NOTE — MAU Provider Note (Signed)
History     CSN: [id number]  Arrival date and time: 06/24/12 1104   None     Chief Complaint  Patient presents with  . Vaginal Bleeding   HPI 32 y.o. [id number] with c/o ongoing heavy vagninal bleeding. IUD put in on Thursday, out on Friday d/t "excruciating pain and bleeding", reports heavy bleeding, > 1 super pad/hour x 5 days, dizzy and fatigued. Taking Provera 10 mg qday x 3 days. Seen in MAU on Friday for same c/o, has f/u in GYN clinic in 4 weeks.    Past Medical History  Diagnosis Date  . Migraine   . Depression   . Bipolar 1 disorder   . Otitis media   . Strep throat   . Asthma   . Fibromyalgia 04/2012    Past Surgical History  Procedure Laterality Date  . Tonsillectomy    . Cholecystectomy    . Exploratory laparotomy    . Tubal ligation    . Exploratory laparotomy    . Tympanostomy      Family History  Problem Relation Age of Onset  . Depression Mother     History  Substance Use Topics  . Smoking status: Current Every Day Smoker -- 1.00 packs/day    Types: Cigarettes  . Smokeless tobacco: Never Used  . Alcohol Use: No    Allergies:  Allergies  Allergen Reactions  . Morphine And Related Hives  . Reglan (Metoclopramide)     jittery    Prescriptions prior to admission  Medication Sig Dispense Refill  . ARIPiprazole (ABILIFY) 5 MG tablet Take 5 mg by mouth daily.       . busPIRone (BUSPAR) 15 MG tablet Take 15 mg by mouth 2 (two) times daily.      . cetirizine (ZYRTEC) 10 MG tablet Take 10 mg by mouth daily.       . cyclobenzaprine (FLEXERIL) 10 MG tablet Take 10 mg by mouth 2 (two) times daily as needed for muscle spasms.      . DULoxetine (CYMBALTA) 60 MG capsule Take 60 mg by mouth daily.      Marland Kitchen gabapentin (NEURONTIN) 300 MG capsule Take 600 mg by mouth 3 (three) times daily.       . hydrOXYzine (ATARAX/VISTARIL) 25 MG tablet Take 50 mg by mouth at bedtime.       . medroxyPROGESTERone (PROVERA) 10 MG tablet Take 1 tablet (10 mg total) by mouth  daily.  30 tablet  1  . topiramate (TOPAMAX) 25 MG tablet Take 75 mg by mouth 2 (two) times daily.         ROS Physical Exam   Blood pressure 120/85, pulse 93, resp. rate 18, last menstrual period 05/24/2012, SpO2 98.00%. Patient Vitals for the past 24 hrs:  BP Pulse Resp SpO2  06/24/12 1228 120/85 mmHg 93 - -  06/24/12 1227 121/81 mmHg 82 - -  06/24/12 1225 121/75 mmHg 78 - -  06/24/12 1142 124/84 mmHg 91 18 98 %  06/24/12 1139 - - - 98 %     Physical Exam  Nursing note and vitals reviewed. Constitutional: She is oriented to person, place, and time. She appears well-developed and well-nourished. No distress.  HENT:  Head: Normocephalic and atraumatic.  Cardiovascular: Normal rate.   Respiratory: Effort normal. No respiratory distress.  GI: Soft. She exhibits no distension and no mass. There is no tenderness. There is no rebound and no guarding.  Genitourinary: There is no rash or lesion on the right labia.  There is no rash or lesion on the left labia. Uterus is tender. Uterus is not deviated, not enlarged and not fixed. Cervix exhibits no motion tenderness, no discharge and no friability. Right adnexum displays tenderness. Right adnexum displays no mass and no fullness. Left adnexum displays tenderness. Left adnexum displays no mass and no fullness. There is bleeding (small - moderate) around the vagina. No erythema or tenderness around the vagina. No vaginal discharge found.  Mild diffuse tenderness on exam   Neurological: She is alert and oriented to person, place, and time.  Skin: Skin is warm and dry.  Psychiatric: She has a normal mood and affect.    MAU Course  Procedures  Results for orders placed during the hospital encounter of 06/24/12 (from the past 24 hour(s))  CBC     Status: Abnormal   Collection Time    06/24/12 11:16 AM      Result Value Range   WBC 15.1 (*) 4.0 - 10.5 K/uL   RBC 4.50  3.87 - 5.11 MIL/uL   Hemoglobin 13.9  12.0 - 15.0 g/dL   HCT 16.1   09.6 - 04.5 %   MCV 88.2  78.0 - 100.0 fL   MCH 30.9  26.0 - 34.0 pg   MCHC 35.0  30.0 - 36.0 g/dL   RDW 40.9  81.1 - 91.4 %   Platelets 295  150 - 400 K/uL   Pain completely relieved with Toradol 60 mg IM  Assessment and Plan   1. Dysmenorrhea   Discussed with patient that this appears to be her regular period - bleeding is not heavy on exam, Hgb is 13.9, vitals are WNL, not orthostatic. D/t patient's strong desire to stop bleeding and concern over amount of bleeding, will increase provera from 10 to 20 mg to hopefully stop bleeding more quickly, pt to f/u in GYN clinic as scheduled.     Medication List    TAKE these medications       ARIPiprazole 5 MG tablet  Commonly known as:  ABILIFY  Take 5 mg by mouth daily.     busPIRone 15 MG tablet  Commonly known as:  BUSPAR  Take 15 mg by mouth 2 (two) times daily.     cetirizine 10 MG tablet  Commonly known as:  ZYRTEC  Take 10 mg by mouth daily.     cyclobenzaprine 10 MG tablet  Commonly known as:  FLEXERIL  Take 10 mg by mouth 2 (two) times daily as needed for muscle spasms.     DULoxetine 60 MG capsule  Commonly known as:  CYMBALTA  Take 60 mg by mouth daily.     gabapentin 300 MG capsule  Commonly known as:  NEURONTIN  Take 600 mg by mouth 3 (three) times daily.     hydrOXYzine 25 MG tablet  Commonly known as:  ATARAX/VISTARIL  Take 50 mg by mouth at bedtime.     ibuprofen 800 MG tablet  Commonly known as:  ADVIL,MOTRIN  Take 1 tablet (800 mg total) by mouth every 8 (eight) hours as needed for pain.     medroxyPROGESTERone 10 MG tablet  Commonly known as:  PROVERA  Take 2 tablets (20 mg total) by mouth daily.     topiramate 25 MG tablet  Commonly known as:  TOPAMAX  Take 75 mg by mouth 2 (two) times daily.            Follow-up Information   Follow up with [facility]. (as  scheduled)    Contact information:   [address] [place] [address] [phone]         [facility] 06/24/2012, 12:44 PM

## 2012-06-26 ENCOUNTER — Encounter: Payer: Self-pay | Admitting: *Deleted

## 2012-07-18 ENCOUNTER — Ambulatory Visit (INDEPENDENT_AMBULATORY_CARE_PROVIDER_SITE_OTHER): Payer: Medicaid Other | Admitting: Obstetrics & Gynecology

## 2012-07-18 ENCOUNTER — Encounter: Payer: Self-pay | Admitting: Obstetrics & Gynecology

## 2012-07-18 VITALS — BP 127/87 | HR 90 | Temp 98.9°F | Ht 61.5 in | Wt 189.5 lb

## 2012-07-18 DIAGNOSIS — N946 Dysmenorrhea, unspecified: Secondary | ICD-10-CM

## 2012-07-18 NOTE — Patient Instructions (Addendum)
Hysterectomy Information   A hysterectomy is a procedure where your uterus is surgically removed. It will no longer be possible to have menstrual periods or to become pregnant. The tubes and ovaries can be removed (bilateral salpingo-oopherectomy) during this surgery as well.    REASONS FOR A HYSTERECTOMY  · Persistent, abnormal bleeding.  · Lasting (chronic) pelvic pain or infection.  · The lining of the uterus (endometrium) starts growing outside the uterus (endometriosis).  · The endometrium starts growing in the muscle of the uterus (adenomyosis).  · The uterus falls down into the vagina (pelvic organ prolapse).  · Symptomatic uterine fibroids.  · Precancerous cells.  · Cervical cancer or uterine cancer.  TYPES OF HYSTERECTOMIES  · Supracervical hysterectomy. This type removes the top part of the uterus, but not the cervix.  · Total hysterectomy. This type removes the uterus and cervix.  · Radical hysterectomy. This type removes the uterus, cervix, and the fibrous tissue that holds the uterus in place in the pelvis (parametrium).  WAYS A HYSTERECTOMY CAN BE PERFORMED  · Abdominal hysterectomy. A large surgical cut (incision) is made in the abdomen. The uterus is removed through this incision.  · Vaginal hysterectomy. An incision is made in the vagina. The uterus is removed through this incision. There are no abdominal incisions.  · Conventional laparoscopic hysterectomy. A thin, lighted tube with a camera (laparoscope) is inserted into 3 or 4 small incisions in the abdomen. The uterus is cut into small pieces. The small pieces are removed through the incisions, or they are removed through the vagina.  · Laparoscopic assisted vaginal hysterectomy (LAVH). Three or four small incisions are made in the abdomen. Part of the surgery is performed laparoscopically and part vaginally. The uterus is removed through the vagina.  · Robot-assisted laparoscopic hysterectomy. A laparoscope is inserted into 3 or 4 small  incisions in the abdomen. A computer-controlled device is used to give the surgeon a 3D image. This allows for more precise movements of surgical instruments. The uterus is cut into small pieces and removed through the incisions or removed through the vagina.  RISKS OF HYSTERECTOMY    · Bleeding and risk of blood transfusion. Tell your caregiver if you do not want to receive any blood products.  · Blood clots in the legs or lung.  · Infection.  · Injury to surrounding organs.  · Anesthesia problems or side effects.  · Conversion to an abdominal hysterectomy.  WHAT TO EXPECT AFTER A HYSTERECTOMY  · You will be given pain medicine.  · You will need to have someone with you for the first 3 to 5 days after you go home.  · You will need to follow up with your surgeon in 2 to 4 weeks after surgery to evaluate your progress.  · You may have early menopause symptoms like hot flashes, night sweats, and insomnia.  · If you had a hysterectomy for a problem that was not a cancer or a condition that could lead to cancer, then you no longer need Pap tests. However, even if you no longer need a Pap test, a regular exam is a good idea to make sure no other problems are starting.  Document Released: 08/16/2000 Document Revised: 05/15/2011 Document Reviewed: 10/01/2010  [employer]® Patient Information ©2013 [employer].

## 2012-07-18 NOTE — Progress Notes (Signed)
Subjective:     Patient ID: Tiffany Whitney, female   DOB: Jan 12, 1981, 32 y.o.   MRN: [id number]  HPI Pt is a G6P2 with h/o pelvic pain pt had IUD placed on her last visit but, she went to the ED that night and she had the IUD removed- within 24 hours of placement.  She did not take pain meds due to being under 'pain contract' for her fibromyalgia.  PAP 04/2012 WNL   Review of Systems     Objective:   Physical Exam BP 127/87  Pulse 90  Temp(Src) 98.9 F (37.2 C)  Ht 5' 1.5" (1.562 m)  Wt 189 lb 8 oz (85.957 kg)  BMI 35.23 kg/m2 Pt in NAD Abd: soft; NT; ND there is a RUQ incision well healed.  There are NO transverse incisions GU: EGBUS: no lesions Vagina: no blood in vault Cervix: no lesion; no CMT Uterus: small, mobile ~12 weeks sized Adnexa: no masses; non tender   12/18/2011 Findings:  Uterus: Is retroverted and retroflexed and demonstrates a sagittal  length of 8.4 cm, depth of 5.1 cm and width of 4.9 cm. A  homogeneous myometrium is seen  Endometrium: Appears trilayered with a width of 12 mm. No areas of  focal thickening or heterogeneity are seen and this would correlate  with a periovulatory endometrial stripe and correspond with the  provided LMP of 11/28/2011  Right ovary: Has a normal appearance measuring 3.7 x 2.0 x 1.8 cm  Left ovary: Has a normal appearance measuring 3.5 x 2.0 x 1.7 cm  Other findings: A trace of simple free fluid is noted in the right  adnexa. No separate adnexal masses are identified.  IMPRESSION:  Normal periovulatory pelvic ultrasound with no sonographic stigmata  suggestive of pelvic inflammatory disease or tubo-ovarian abscess  identified.           Assessment:     Dysmenorrhea pt with no resolution with conservative management- pt was intolerant of Mirena due pain. Pt concerned about potential pain that may be caused by ablation .        Plan:     F/u for TVH Pt to have OV to discuss the procedure prior to surgery

## 2012-07-18 NOTE — Progress Notes (Signed)
Was scheduled to be here for IUD string check, but had to have it removed within 24 hours because of severe pain - removed at [facility] . Then had to go to MAU twice because of heavy bleeding and was put on provera and was told to keep this appointment for follow up

## 2012-07-23 ENCOUNTER — Encounter (HOSPITAL_BASED_OUTPATIENT_CLINIC_OR_DEPARTMENT_OTHER): Payer: Self-pay | Admitting: *Deleted

## 2012-07-23 ENCOUNTER — Emergency Department (HOSPITAL_BASED_OUTPATIENT_CLINIC_OR_DEPARTMENT_OTHER)
Admission: EM | Admit: 2012-07-23 | Discharge: 2012-07-23 | Disposition: A | Discharge status: Home / Self Care | Payer: Medicaid Other | Attending: Emergency Medicine | Admitting: Emergency Medicine

## 2012-07-23 DIAGNOSIS — Z8739 Personal history of other diseases of the musculoskeletal system and connective tissue: Secondary | ICD-10-CM | POA: Insufficient documentation

## 2012-07-23 DIAGNOSIS — G43909 Migraine, unspecified, not intractable, without status migrainosus: Secondary | ICD-10-CM | POA: Insufficient documentation

## 2012-07-23 DIAGNOSIS — Z8619 Personal history of other infectious and parasitic diseases: Secondary | ICD-10-CM | POA: Insufficient documentation

## 2012-07-23 DIAGNOSIS — Z79899 Other long term (current) drug therapy: Secondary | ICD-10-CM | POA: Insufficient documentation

## 2012-07-23 DIAGNOSIS — F17200 Nicotine dependence, unspecified, uncomplicated: Secondary | ICD-10-CM | POA: Insufficient documentation

## 2012-07-23 DIAGNOSIS — Z8669 Personal history of other diseases of the nervous system and sense organs: Secondary | ICD-10-CM | POA: Insufficient documentation

## 2012-07-23 DIAGNOSIS — F319 Bipolar disorder, unspecified: Secondary | ICD-10-CM | POA: Insufficient documentation

## 2012-07-23 DIAGNOSIS — J45909 Unspecified asthma, uncomplicated: Secondary | ICD-10-CM | POA: Insufficient documentation

## 2012-07-23 DIAGNOSIS — Z8742 Personal history of other diseases of the female genital tract: Secondary | ICD-10-CM | POA: Insufficient documentation

## 2012-07-23 MED ORDER — PROMETHAZINE HCL 25 MG/ML IJ SOLN
25.0000 mg | Freq: Once | INTRAMUSCULAR | Status: DC
  Filled 2012-07-23: qty 1

## 2012-07-23 MED ORDER — HYDROMORPHONE HCL PF 2 MG/ML IJ SOLN
2.0000 mg | Freq: Once | INTRAMUSCULAR | Status: AC
  Administered 2012-07-23: 2 mg via INTRAMUSCULAR

## 2012-07-23 MED ORDER — PROMETHAZINE HCL 25 MG/ML IJ SOLN
25.0000 mg | Freq: Once | INTRAMUSCULAR | Status: AC
  Administered 2012-07-23: 25 mg via INTRAMUSCULAR

## 2012-07-23 MED ORDER — HYDROMORPHONE HCL PF 2 MG/ML IJ SOLN
2.0000 mg | Freq: Once | INTRAMUSCULAR | Status: DC
  Filled 2012-07-23: qty 1

## 2012-07-23 NOTE — ED Provider Notes (Signed)
History     CSN: [id number]  Arrival date & time 07/23/12  0434   First MD Initiated Contact with Patient 07/23/12 0454      Chief Complaint  Patient presents with  . Migraine    (Consider location/radiation/quality/duration/timing/severity/associated sxs/prior treatment) HPI  Patient complaining of migraine headaches.  Headache like prior.  Pain began 3 hours ago.  Pain left side head, throbbing type pain.  Patient on topamax bid without missed doses.  Tonight took goody powder without relief.  Nausea and light sensitive.  No vomiting, fever, chills neck pain.  Past Medical History  Diagnosis Date  . Migraine   . Depression   . Bipolar 1 disorder   . Otitis media   . Strep throat   . Asthma   . Fibromyalgia 04/2012  . Restless leg syndrome   . Menorrhagia     Past Surgical History  Procedure Laterality Date  . Tonsillectomy    . Cholecystectomy    . Tubal ligation    . Tympanostomy      Family History  Problem Relation Age of Onset  . Depression Mother     History  Substance Use Topics  . Smoking status: Current Every Day Smoker -- 1.00 packs/day    Types: Cigarettes  . Smokeless tobacco: Never Used  . Alcohol Use: No    OB History   Grav Para Term Preterm Abortions TAB SAB Ect Mult Living   6 3 2 1 3  3   3       Review of Systems  All other systems reviewed and are negative.    Allergies  Morphine and related and Reglan  Home Medications   Current Outpatient Rx  Name  Route  Sig  Dispense  Refill  . ARIPiprazole (ABILIFY) 5 MG tablet   Oral   Take 5 mg by mouth daily.          . busPIRone (BUSPAR) 5 MG tablet   Oral   Take 5 mg by mouth 2 (two) times daily.         . cetirizine (ZYRTEC) 10 MG tablet   Oral   Take 10 mg by mouth daily.          . cyclobenzaprine (FLEXERIL) 10 MG tablet   Oral   Take 10 mg by mouth 2 (two) times daily as needed for muscle spasms.         Marland Kitchen desvenlafaxine (PRISTIQ) 50 MG 24 hr tablet    Oral   Take 50 mg by mouth daily.         Marland Kitchen gabapentin (NEURONTIN) 300 MG capsule   Oral   Take 600 mg by mouth 3 (three) times daily.          . medroxyPROGESTERone (PROVERA) 10 MG tablet   Oral   Take 2 tablets (20 mg total) by mouth daily.   30 tablet   1   . QUEtiapine (SEROQUEL) 25 MG tablet   Oral   Take 25 mg by mouth 2 (two) times daily.         Marland Kitchen topiramate (TOPAMAX) 25 MG tablet   Oral   Take 75 mg by mouth 2 (two) times daily.          . traMADol (ULTRAM) 50 MG tablet   Oral   Take 50 mg by mouth every 6 (six) hours as needed for pain.         Marland Kitchen ibuprofen (ADVIL,MOTRIN) 800 MG tablet   Oral  Take 1 tablet (800 mg total) by mouth every 8 (eight) hours as needed for pain.   30 tablet   0     BP 150/77  Pulse 89  Resp 18  Ht 5\' 1"  (1.549 m)  Wt 190 lb (86.183 kg)  BMI 35.92 kg/m2  SpO2 100%  Physical Exam  Nursing note and vitals reviewed. Constitutional: She is oriented to person, place, and time. She appears well-developed and well-nourished.  HENT:  Head: Normocephalic and atraumatic.  Right Ear: Tympanic membrane and external ear normal.  Left Ear: Tympanic membrane and external ear normal.  Nose: Nose normal. Right sinus exhibits no maxillary sinus tenderness and no frontal sinus tenderness. Left sinus exhibits no maxillary sinus tenderness and no frontal sinus tenderness.  Eyes: Conjunctivae and EOM are normal. Pupils are equal, round, and reactive to light. Right eye exhibits no nystagmus. Left eye exhibits no nystagmus.  Neck: Normal range of motion. Neck supple.  Cardiovascular: Normal rate, regular rhythm, normal heart sounds and intact distal pulses.   Pulmonary/Chest: Effort normal and breath sounds normal. No respiratory distress. She exhibits no tenderness.  Abdominal: Soft. Bowel sounds are normal. She exhibits no distension and no mass. There is no tenderness.  Musculoskeletal: Normal range of motion. She exhibits no edema and  no tenderness.  Neurological: She is alert and oriented to person, place, and time. She has normal strength and normal reflexes. No sensory deficit. She displays a negative Romberg sign. GCS eye subscore is 4. GCS verbal subscore is 5. GCS motor subscore is 6.  Reflex Scores:      Tricep reflexes are 2+ on the right side and 2+ on the left side.      Bicep reflexes are 2+ on the right side and 2+ on the left side.      Brachioradialis reflexes are 2+ on the right side and 2+ on the left side.      Patellar reflexes are 2+ on the right side and 2+ on the left side.      Achilles reflexes are 2+ on the right side and 2+ on the left side. Patient with normal gait without ataxia, shuffling, spasm, or antalgia. Speech is normal without dysarthria, dysphasia, or aphasia. Muscle strength is 5/5 in bilateral shoulders, elbow flexor and extensors, wrist flexor and extensors, and intrinsic hand muscles. 5/5 bilateral lower extremity hip flexors, extensors, knee flexors and extensors, and ankle dorsi and plantar flexors.    Skin: Skin is warm and dry. No rash noted.  Psychiatric: She has a normal mood and affect. Her behavior is normal. Judgment and thought content normal.    ED Course  Procedures (including critical care time)  Labs Reviewed - No data to display No results found.   No diagnosis found.    MDM  Migraine        Hilario Quarry, MD 07/23/12 0500

## 2012-07-23 NOTE — ED Notes (Signed)
MD at bedside. 

## 2012-07-23 NOTE — ED Notes (Signed)
Migraine headache x 3 hours, nausea but no vomiting. No relief from Goody's powder.

## 2012-08-01 ENCOUNTER — Encounter (HOSPITAL_BASED_OUTPATIENT_CLINIC_OR_DEPARTMENT_OTHER): Payer: Self-pay | Admitting: *Deleted

## 2012-08-01 ENCOUNTER — Emergency Department (HOSPITAL_BASED_OUTPATIENT_CLINIC_OR_DEPARTMENT_OTHER)
Admission: EM | Admit: 2012-08-01 | Discharge: 2012-08-01 | Disposition: A | Discharge status: Home / Self Care | Payer: Medicaid Other | Attending: Emergency Medicine | Admitting: Emergency Medicine

## 2012-08-01 DIAGNOSIS — F17200 Nicotine dependence, unspecified, uncomplicated: Secondary | ICD-10-CM | POA: Insufficient documentation

## 2012-08-01 DIAGNOSIS — F319 Bipolar disorder, unspecified: Secondary | ICD-10-CM | POA: Insufficient documentation

## 2012-08-01 DIAGNOSIS — R519 Headache, unspecified: Secondary | ICD-10-CM

## 2012-08-01 DIAGNOSIS — R51 Headache: Secondary | ICD-10-CM | POA: Insufficient documentation

## 2012-08-01 DIAGNOSIS — Z79899 Other long term (current) drug therapy: Secondary | ICD-10-CM | POA: Insufficient documentation

## 2012-08-01 DIAGNOSIS — Z8742 Personal history of other diseases of the female genital tract: Secondary | ICD-10-CM | POA: Insufficient documentation

## 2012-08-01 DIAGNOSIS — J45909 Unspecified asthma, uncomplicated: Secondary | ICD-10-CM | POA: Insufficient documentation

## 2012-08-01 DIAGNOSIS — IMO0001 Reserved for inherently not codable concepts without codable children: Secondary | ICD-10-CM | POA: Insufficient documentation

## 2012-08-01 DIAGNOSIS — G2581 Restless legs syndrome: Secondary | ICD-10-CM | POA: Insufficient documentation

## 2012-08-01 MED ORDER — KETOROLAC TROMETHAMINE 15 MG/ML IJ SOLN
15.0000 mg | Freq: Once | INTRAMUSCULAR | Status: AC
  Administered 2012-08-01: 15 mg via INTRAVENOUS
  Filled 2012-08-01: qty 1

## 2012-08-01 MED ORDER — DIPHENHYDRAMINE HCL 50 MG/ML IJ SOLN
25.0000 mg | Freq: Once | INTRAMUSCULAR | Status: AC
  Administered 2012-08-01: 25 mg via INTRAVENOUS
  Filled 2012-08-01: qty 1

## 2012-08-01 MED ORDER — PROCHLORPERAZINE MALEATE 10 MG PO TABS
10.0000 mg | ORAL_TABLET | Freq: Once | ORAL | Status: AC
  Administered 2012-08-01: 10 mg via ORAL

## 2012-08-01 MED ORDER — PROCHLORPERAZINE MALEATE 10 MG PO TABS
ORAL_TABLET | ORAL | Status: AC
  Filled 2012-08-01: qty 1

## 2012-08-01 MED ORDER — PROCHLORPERAZINE EDISYLATE 5 MG/ML IJ SOLN
10.0000 mg | Freq: Four times a day (QID) | INTRAMUSCULAR | Status: DC | PRN
  Filled 2012-08-01: qty 2

## 2012-08-01 MED ORDER — SODIUM CHLORIDE 0.9 % IV BOLUS (SEPSIS)
1000.0000 mL | Freq: Once | INTRAVENOUS | Status: AC
  Administered 2012-08-01: 1000 mL via INTRAVENOUS

## 2012-08-01 NOTE — ED Notes (Signed)
Pt states she awoke at 5am with her usual migraine pain, states she gets migraines twice monthly, took topamax with no relief.

## 2012-08-07 ENCOUNTER — Emergency Department (HOSPITAL_BASED_OUTPATIENT_CLINIC_OR_DEPARTMENT_OTHER)
Admission: EM | Admit: 2012-08-07 | Discharge: 2012-08-07 | Disposition: A | Discharge status: Home / Self Care | Payer: Medicaid Other | Attending: Emergency Medicine | Admitting: Emergency Medicine

## 2012-08-07 ENCOUNTER — Encounter (HOSPITAL_BASED_OUTPATIENT_CLINIC_OR_DEPARTMENT_OTHER): Payer: Self-pay

## 2012-08-07 DIAGNOSIS — M797 Fibromyalgia: Secondary | ICD-10-CM

## 2012-08-07 DIAGNOSIS — Z8679 Personal history of other diseases of the circulatory system: Secondary | ICD-10-CM | POA: Insufficient documentation

## 2012-08-07 DIAGNOSIS — G8929 Other chronic pain: Secondary | ICD-10-CM | POA: Insufficient documentation

## 2012-08-07 DIAGNOSIS — M545 Low back pain, unspecified: Secondary | ICD-10-CM | POA: Insufficient documentation

## 2012-08-07 DIAGNOSIS — F17200 Nicotine dependence, unspecified, uncomplicated: Secondary | ICD-10-CM | POA: Insufficient documentation

## 2012-08-07 DIAGNOSIS — G43909 Migraine, unspecified, not intractable, without status migrainosus: Secondary | ICD-10-CM | POA: Insufficient documentation

## 2012-08-07 DIAGNOSIS — IMO0001 Reserved for inherently not codable concepts without codable children: Secondary | ICD-10-CM | POA: Insufficient documentation

## 2012-08-07 DIAGNOSIS — Z8742 Personal history of other diseases of the female genital tract: Secondary | ICD-10-CM | POA: Insufficient documentation

## 2012-08-07 DIAGNOSIS — J45909 Unspecified asthma, uncomplicated: Secondary | ICD-10-CM | POA: Insufficient documentation

## 2012-08-07 DIAGNOSIS — Z8619 Personal history of other infectious and parasitic diseases: Secondary | ICD-10-CM | POA: Insufficient documentation

## 2012-08-07 DIAGNOSIS — Z8669 Personal history of other diseases of the nervous system and sense organs: Secondary | ICD-10-CM | POA: Insufficient documentation

## 2012-08-07 DIAGNOSIS — F319 Bipolar disorder, unspecified: Secondary | ICD-10-CM | POA: Insufficient documentation

## 2012-08-07 DIAGNOSIS — Z79899 Other long term (current) drug therapy: Secondary | ICD-10-CM | POA: Insufficient documentation

## 2012-08-07 DIAGNOSIS — M549 Dorsalgia, unspecified: Secondary | ICD-10-CM

## 2012-08-07 DIAGNOSIS — E669 Obesity, unspecified: Secondary | ICD-10-CM | POA: Insufficient documentation

## 2012-08-07 MED ORDER — METHOCARBAMOL 500 MG PO TABS: ORAL_TABLET | ORAL | Status: DC

## 2012-08-07 MED ORDER — METHOCARBAMOL 100 MG/ML IJ SOLN: 1000.0000 mg | Freq: Once | INTRAMUSCULAR | Status: DC

## 2012-08-07 MED ORDER — METHOCARBAMOL 500 MG PO TABS
1000.0000 mg | ORAL_TABLET | Freq: Once | ORAL | Status: AC
  Administered 2012-08-07: 1000 mg via ORAL
  Filled 2012-08-07: qty 2

## 2012-08-07 NOTE — ED Notes (Signed)
Pt with hx of fibromyalgia, states that she has bilateral lower back pain.  Pt states that pain is severe in nature, rated at 10/10.  Ambulates with no difficulty to triage.

## 2012-08-07 NOTE — ED Provider Notes (Signed)
History     CSN: [id number]  Arrival date & time 08/07/12  1326   First MD Initiated Contact with Patient 08/07/12 1339      Chief Complaint  Patient presents with  . Back Pain    (Consider location/radiation/quality/duration/timing/severity/associated sxs/prior treatment) HPI Comments: 32 y.o. Female with PMHx of fibromyalgia presents complaining of acute onset exacerbation of same in bilateral  lower back. Described as stabbing. Onset last night. Pt states she could not get comfortable sleeping. Tried her ultram and flexeril with no relief. Worse with movement, better with rest. 10/10. Does not radiate. Pt is seeking pain relief.   Patient is a 32 y.o. female presenting with back pain. The history is provided by the patient.  Back Pain Location:  Lumbar spine (bilateral paraspinous muscles) Quality:  Stabbing Radiates to:  Does not radiate Pain severity:  Severe Pain is:  Same all the time Onset quality:  Sudden Duration: since last night. Timing:  Constant Progression:  Worsening Chronicity:  Chronic Context comment:  Exacerbation of fibromyalgia, no new trauma Relieved by:  Bed rest and being still Worsened by:  Movement and palpation Ineffective treatments: ultram, flexeril. Associated symptoms: no abdominal pain, no bladder incontinence, no bowel incontinence, no chest pain, no dysuria, no fever, no headaches, no leg pain, no numbness, no paresthesias, no pelvic pain, no perianal numbness, no tingling and no weakness   Risk factors: lack of exercise and obesity     Past Medical History  Diagnosis Date  . Migraine   . Depression   . Bipolar 1 disorder   . Otitis media   . Strep throat   . Asthma   . Fibromyalgia 04/2012  . Restless leg syndrome   . Menorrhagia     Past Surgical History  Procedure Laterality Date  . Tonsillectomy    . Cholecystectomy    . Tubal ligation    . Tympanostomy      Family History  Problem Relation Age of Onset  . Depression  Mother     History  Substance Use Topics  . Smoking status: Current Every Day Smoker -- 1.00 packs/day    Types: Cigarettes  . Smokeless tobacco: Never Used  . Alcohol Use: No    OB History   Grav Para Term Preterm Abortions TAB SAB Ect Mult Living   6 3 2 1 3  3   3       Review of Systems  Constitutional: Negative for fever and diaphoresis.  HENT: Negative for neck pain and neck stiffness.   Eyes: Negative for visual disturbance.  Respiratory: Negative for apnea, chest tightness and shortness of breath.   Cardiovascular: Negative for chest pain and palpitations.  Gastrointestinal: Negative for nausea, vomiting, abdominal pain, diarrhea, constipation and bowel incontinence.  Genitourinary: Negative for bladder incontinence, dysuria and pelvic pain.  Musculoskeletal: Positive for back pain. Negative for gait problem.  Skin: Negative for rash.  Neurological: Negative for dizziness, tingling, weakness, light-headedness, numbness, headaches and paresthesias.    Allergies  Morphine and related and Reglan  Home Medications   Current Outpatient Rx  Name  Route  Sig  Dispense  Refill  . ARIPiprazole (ABILIFY) 5 MG tablet   Oral   Take 5 mg by mouth daily.          . busPIRone (BUSPAR) 5 MG tablet   Oral   Take 5 mg by mouth 2 (two) times daily.         . cetirizine (ZYRTEC) 10 MG tablet  Oral   Take 10 mg by mouth daily.          . cyclobenzaprine (FLEXERIL) 10 MG tablet   Oral   Take 10 mg by mouth 2 (two) times daily as needed for muscle spasms.         Marland Kitchen desvenlafaxine (PRISTIQ) 50 MG 24 hr tablet   Oral   Take 50 mg by mouth daily.         Marland Kitchen gabapentin (NEURONTIN) 300 MG capsule   Oral   Take 600 mg by mouth 3 (three) times daily.          Marland Kitchen ibuprofen (ADVIL,MOTRIN) 800 MG tablet   Oral   Take 1 tablet (800 mg total) by mouth every 8 (eight) hours as needed for pain.   30 tablet   0   . QUEtiapine (SEROQUEL) 25 MG tablet   Oral   Take 25  mg by mouth 2 (two) times daily.         Marland Kitchen topiramate (TOPAMAX) 25 MG tablet   Oral   Take 75 mg by mouth 2 (two) times daily.          . traMADol (ULTRAM) 50 MG tablet   Oral   Take 50 mg by mouth every 6 (six) hours as needed for pain.         . medroxyPROGESTERone (PROVERA) 10 MG tablet   Oral   Take 2 tablets (20 mg total) by mouth daily.   30 tablet   1   . methocarbamol (ROBAXIN) 500 MG tablet      Take one or two pills every 6 hours as needed for pain   10 tablet   0     BP 142/98  Pulse 119  Temp(Src) 98.6 F (37 C) (Oral)  Resp 20  Ht 5' 1.5" (1.562 m)  Wt 190 lb (86.183 kg)  BMI 35.32 kg/m2  SpO2 100%  LMP 08/01/2012  Physical Exam  Nursing note and vitals reviewed. Constitutional: She is oriented to person, place, and time. She appears well-developed and well-nourished. No distress.  uncomfortable  HENT:  Head: Normocephalic and atraumatic.  Eyes: Conjunctivae and EOM are normal.  Neck: Normal range of motion. Neck supple.  No meningeal signs  Cardiovascular: Normal rate, regular rhythm and normal heart sounds.  Exam reveals no gallop and no friction rub.   No murmur heard. Pulmonary/Chest: Effort normal and breath sounds normal. No respiratory distress. She has no wheezes. She has no rales. She exhibits no tenderness.  Abdominal: Soft. Bowel sounds are normal. She exhibits no distension. There is no tenderness. There is no rebound and no guarding.  Musculoskeletal: Normal range of motion. She exhibits tenderness. She exhibits no edema.  FROM to upper and lower extremities No step-offs noted on C-spine No tenderness to palpation of the spinous processes of the C-spine, T-spine or L-spine Full range of motion of C-spine, T-spine or L-spine Significant tenderness to palpation of the lumbar paraspinous muscles   Neurological: She is alert and oriented to person, place, and time. No cranial nerve deficit.  Speech is clear and goal oriented, follows  commands Sensation normal to light touch  Moves extremities without ataxia, coordination intact Normal gait and balance Normal strength in upper and lower extremities bilaterally including dorsiflexion and plantar flexion, strong and equal grip strength   Skin: Skin is warm and dry. She is not diaphoretic. No erythema.  Psychiatric:  tearful    ED Course  Procedures (including critical care  time)  Labs Reviewed - No data to display No results found.   1. Chronic back pain greater than 3 months duration   2. Fibromyalgia       MDM  Pt looks uncomfortable, rating pain 10/10. No neurological deficits and normal neuro exam.  Patient can walk but states is painful.  No loss of bowel or bladder control.  No concern for cauda equina.  No fever, night sweats, weight loss, h/o cancer, IVDU.  Review of notes seems unclear if pt is under pain management contract or not. Pt notes she has been to her primary care doctor, seen at [facility], and at pain management for [facility]. I am not inclined to prescribe her narcotic pain medicine, but will give her a dose of robaxin in ED and see if that abates her pain.  On re-evaluation, pt states she is 5/10. Will give a prescription for Robaxin, discussed and provided chronic pain care in the ED, and directed her to follow up with her other doctors to discuss her pain management regimen. Discussed reasons to seek immediate care. Patient expresses understanding and agrees with plan.   Glade Nurse, PA-C 08/07/12 1640

## 2012-08-08 NOTE — ED Provider Notes (Signed)
History    32 year old female with headache. Patient has a history of what she calls migraine headaches. Current headache started around 5:00 this morning. She is lying in bed which he first noticed it. She noticed it when she woke up, but she does nothing to specifically woke her up. Pain has been pretty constant since. Diffuse and achy. Patient states that she is migraine headaches once to twice a month and typically around the timing of her menstrual cycle. Misunderstanding,. No fevers or chills. No neck pain or neck stiffness. No visual complaints. No numbness, tingling or loss of strength. CSN: [id number]  Arrival date & time 08/01/12  4098   First MD Initiated Contact with Patient 08/01/12 [phone]      Chief Complaint  Patient presents with  . Migraine    (Consider location/radiation/quality/duration/timing/severity/associated sxs/prior treatment) HPI  Past Medical History  Diagnosis Date  . Migraine   . Depression   . Bipolar 1 disorder   . Otitis media   . Strep throat   . Asthma   . Fibromyalgia 04/2012  . Restless leg syndrome   . Menorrhagia     Past Surgical History  Procedure Laterality Date  . Tonsillectomy    . Cholecystectomy    . Tubal ligation    . Tympanostomy      Family History  Problem Relation Age of Onset  . Depression Mother     History  Substance Use Topics  . Smoking status: Current Every Day Smoker -- 1.00 packs/day    Types: Cigarettes  . Smokeless tobacco: Never Used  . Alcohol Use: No    OB History   Grav Para Term Preterm Abortions TAB SAB Ect Mult Living   6 3 2 1 3  3   3       Review of Systems  All systems reviewed and negative, other than as noted in HPI.   Allergies  Morphine and related and Reglan  Home Medications   Current Outpatient Rx  Name  Route  Sig  Dispense  Refill  . ARIPiprazole (ABILIFY) 5 MG tablet   Oral   Take 5 mg by mouth daily.          . busPIRone (BUSPAR) 5 MG tablet   Oral   Take 5 mg  by mouth 2 (two) times daily.         . cetirizine (ZYRTEC) 10 MG tablet   Oral   Take 10 mg by mouth daily.          . cyclobenzaprine (FLEXERIL) 10 MG tablet   Oral   Take 10 mg by mouth 2 (two) times daily as needed for muscle spasms.         Marland Kitchen desvenlafaxine (PRISTIQ) 50 MG 24 hr tablet   Oral   Take 50 mg by mouth daily.         Marland Kitchen gabapentin (NEURONTIN) 300 MG capsule   Oral   Take 600 mg by mouth 3 (three) times daily.          Marland Kitchen ibuprofen (ADVIL,MOTRIN) 800 MG tablet   Oral   Take 1 tablet (800 mg total) by mouth every 8 (eight) hours as needed for pain.   30 tablet   0   . medroxyPROGESTERone (PROVERA) 10 MG tablet   Oral   Take 2 tablets (20 mg total) by mouth daily.   30 tablet   1   . methocarbamol (ROBAXIN) 500 MG tablet      Take one  or two pills every 6 hours as needed for pain   10 tablet   0   . QUEtiapine (SEROQUEL) 25 MG tablet   Oral   Take 25 mg by mouth 2 (two) times daily.         Marland Kitchen topiramate (TOPAMAX) 25 MG tablet   Oral   Take 75 mg by mouth 2 (two) times daily.          . traMADol (ULTRAM) 50 MG tablet   Oral   Take 50 mg by mouth every 6 (six) hours as needed for pain.           BP 128/79  Pulse 82  Temp(Src) 98.6 F (37 C) (Oral)  Resp 18  Ht 5' 1.5" (1.562 m)  Wt 200 lb (90.719 kg)  BMI 37.18 kg/m2  SpO2 100%  LMP 08/01/2012  Physical Exam  Nursing note and vitals reviewed. Constitutional: She appears well-developed and well-nourished. No distress.  HENT:  Head: Normocephalic and atraumatic.  Eyes: Conjunctivae are normal. Right eye exhibits no discharge. Left eye exhibits no discharge.  Neck: Neck supple.  No nuchal rigidity  Cardiovascular: Normal rate, regular rhythm and normal heart sounds.  Exam reveals no gallop and no friction rub.   No murmur heard. Pulmonary/Chest: Effort normal and breath sounds normal. No respiratory distress.  Abdominal: Soft. She exhibits no distension. There is no  tenderness.  Musculoskeletal: She exhibits no edema and no tenderness.  Neurological: She is alert. No cranial nerve deficit. She exhibits normal muscle tone. Coordination normal.  Good finger to nose testing bilaterally. Gait is steady.  Skin: Skin is warm and dry.  Psychiatric: She has a normal mood and affect. Her behavior is normal. Thought content normal.    ED Course  Procedures (including critical care time)  Labs Reviewed - No data to display No results found.   1. Headache       MDM  31yF with HA. Suspect primary HA. Consider emergent secondary causes such as bleed, infectious or mass but doubt. There is no history of trauma. Pt has a nonfocal neurological exam. Afebrile and neck supple. No use of blood thinning medication. Consider ocular etiology such as acute angle closure glaucoma but doubt. Pt denies acute change in visual acuity and eye exam unremarkable. Doubt temporal arteritis given age, no temporal tenderness and temporal artery pulsations palpable. Doubt CO poisoning. No contacts with similar symptoms. Doubt venous thrombosis. Doubt carotid or vertebral arteries dissection. Symptoms improved with meds. Feel that can be safely discharged, but strict return precautions discussed. Outpt fu.         Raeford Razor, MD 08/08/12 [phone]

## 2012-08-09 NOTE — ED Provider Notes (Signed)
Medical screening examination/treatment/procedure(s) were performed by non-physician practitioner and as supervising physician I was immediately available for consultation/collaboration.  Evelynn Hench, MD 08/09/12 1528 

## 2012-08-21 ENCOUNTER — Emergency Department (HOSPITAL_BASED_OUTPATIENT_CLINIC_OR_DEPARTMENT_OTHER)
Admission: EM | Admit: 2012-08-21 | Discharge: 2012-08-22 | Disposition: A | Discharge status: Home / Self Care | Payer: Medicaid Other | Attending: Emergency Medicine | Admitting: Emergency Medicine

## 2012-08-21 ENCOUNTER — Encounter (HOSPITAL_BASED_OUTPATIENT_CLINIC_OR_DEPARTMENT_OTHER): Payer: Self-pay | Admitting: Emergency Medicine

## 2012-08-21 DIAGNOSIS — Z8742 Personal history of other diseases of the female genital tract: Secondary | ICD-10-CM | POA: Insufficient documentation

## 2012-08-21 DIAGNOSIS — F17200 Nicotine dependence, unspecified, uncomplicated: Secondary | ICD-10-CM | POA: Insufficient documentation

## 2012-08-21 DIAGNOSIS — Z79899 Other long term (current) drug therapy: Secondary | ICD-10-CM | POA: Insufficient documentation

## 2012-08-21 DIAGNOSIS — R339 Retention of urine, unspecified: Secondary | ICD-10-CM | POA: Insufficient documentation

## 2012-08-21 DIAGNOSIS — IMO0001 Reserved for inherently not codable concepts without codable children: Secondary | ICD-10-CM | POA: Insufficient documentation

## 2012-08-21 DIAGNOSIS — Z3202 Encounter for pregnancy test, result negative: Secondary | ICD-10-CM | POA: Insufficient documentation

## 2012-08-21 DIAGNOSIS — N949 Unspecified condition associated with female genital organs and menstrual cycle: Secondary | ICD-10-CM | POA: Insufficient documentation

## 2012-08-21 DIAGNOSIS — R102 Pelvic and perineal pain: Secondary | ICD-10-CM

## 2012-08-21 DIAGNOSIS — F319 Bipolar disorder, unspecified: Secondary | ICD-10-CM | POA: Insufficient documentation

## 2012-08-21 DIAGNOSIS — J45909 Unspecified asthma, uncomplicated: Secondary | ICD-10-CM | POA: Insufficient documentation

## 2012-08-21 DIAGNOSIS — G2581 Restless legs syndrome: Secondary | ICD-10-CM | POA: Insufficient documentation

## 2012-08-21 DIAGNOSIS — G43909 Migraine, unspecified, not intractable, without status migrainosus: Secondary | ICD-10-CM | POA: Insufficient documentation

## 2012-08-21 LAB — URINALYSIS, ROUTINE W REFLEX MICROSCOPIC
Leukocytes, UA: NEGATIVE
Nitrite: NEGATIVE
Specific Gravity, Urine: 1.033 — ABNORMAL HIGH (ref 1.005–1.030)
Urobilinogen, UA: 1 mg/dL (ref 0.0–1.0)
pH: 5.5 (ref 5.0–8.0)

## 2012-08-21 LAB — PREGNANCY, URINE: Preg Test, Ur: NEGATIVE

## 2012-08-21 NOTE — ED Notes (Signed)
Pt c/o painful urination and difficulty voiding, onset tonight

## 2012-08-22 LAB — WET PREP, GENITAL
Clue Cells Wet Prep HPF POC: NONE SEEN
Trich, Wet Prep: NONE SEEN

## 2012-08-22 LAB — GC/CHLAMYDIA PROBE AMP: GC Probe RNA: NEGATIVE

## 2012-08-22 MED ORDER — NAPROXEN 375 MG PO TABS: 375.0000 mg | ORAL_TABLET | Freq: Two times a day (BID) | ORAL | Status: DC

## 2012-08-22 MED ORDER — IBUPROFEN 800 MG PO TABS
800.0000 mg | ORAL_TABLET | Freq: Once | ORAL | Status: AC
  Administered 2012-08-22: 800 mg via ORAL
  Filled 2012-08-22: qty 1

## 2012-08-22 NOTE — ED Provider Notes (Signed)
History     CSN: [id number]  Arrival date & time 08/21/12  2307   First MD Initiated Contact with Patient 08/21/12 2322      Chief Complaint  Patient presents with  . Dysuria  . Urinary Retention    (Consider location/radiation/quality/duration/timing/severity/associated sxs/prior treatment) Patient is a 32 y.o. female presenting with dysuria. The history is provided by the patient. No language interpreter was used.  Dysuria Pain quality:  Burning Pain severity:  Moderate Onset quality:  Sudden Timing:  Constant Progression:  Unchanged Chronicity:  Recurrent Recent urinary tract infections: no   Relieved by:  Nothing Worsened by:  Nothing tried Ineffective treatments:  None tried Urinary symptoms: hesitancy   Urinary symptoms: no discolored urine   Associated symptoms: no vaginal discharge   Risk factors: no renal disease     Past Medical History  Diagnosis Date  . Migraine   . Depression   . Bipolar 1 disorder   . Otitis media   . Strep throat   . Asthma   . Fibromyalgia 04/2012  . Restless leg syndrome   . Menorrhagia     Past Surgical History  Procedure Laterality Date  . Tonsillectomy    . Cholecystectomy    . Tubal ligation    . Tympanostomy      Family History  Problem Relation Age of Onset  . Depression Mother     History  Substance Use Topics  . Smoking status: Current Every Day Smoker -- 1.00 packs/day    Types: Cigarettes  . Smokeless tobacco: Never Used  . Alcohol Use: No    OB History   Grav Para Term Preterm Abortions TAB SAB Ect Mult Living   6 3 2 1 3  3   3       Review of Systems  Genitourinary: Positive for dysuria. Negative for vaginal discharge.  All other systems reviewed and are negative.    Allergies  Morphine and related and Reglan  Home Medications   Current Outpatient Rx  Name  Route  Sig  Dispense  Refill  . busPIRone (BUSPAR) 5 MG tablet   Oral   Take 5 mg by mouth 2 (two) times daily.         .  cetirizine (ZYRTEC) 10 MG tablet   Oral   Take 10 mg by mouth daily.          Marland Kitchen desvenlafaxine (PRISTIQ) 50 MG 24 hr tablet   Oral   Take 50 mg by mouth daily.         Marland Kitchen gabapentin (NEURONTIN) 300 MG capsule   Oral   Take 600 mg by mouth 3 (three) times daily.          . QUEtiapine (SEROQUEL) 25 MG tablet   Oral   Take 25 mg by mouth 2 (two) times daily.         Marland Kitchen tiZANidine (ZANAFLEX) 2 MG tablet   Oral   Take 2 mg by mouth every 8 (eight) hours as needed.         . topiramate (TOPAMAX) 25 MG tablet   Oral   Take 75 mg by mouth 2 (two) times daily.          . ARIPiprazole (ABILIFY) 5 MG tablet   Oral   Take 5 mg by mouth daily.          . cyclobenzaprine (FLEXERIL) 10 MG tablet   Oral   Take 10 mg by mouth 2 (two) times daily  as needed for muscle spasms.         Marland Kitchen ibuprofen (ADVIL,MOTRIN) 800 MG tablet   Oral   Take 1 tablet (800 mg total) by mouth every 8 (eight) hours as needed for pain.   30 tablet   0   . medroxyPROGESTERone (PROVERA) 10 MG tablet   Oral   Take 2 tablets (20 mg total) by mouth daily.   30 tablet   1   . methocarbamol (ROBAXIN) 500 MG tablet      Take one or two pills every 6 hours as needed for pain   10 tablet   0   . traMADol (ULTRAM) 50 MG tablet   Oral   Take 50 mg by mouth every 6 (six) hours as needed for pain.           BP 142/92  Pulse 104  Temp(Src) 98.2 F (36.8 C) (Oral)  Resp 18  Ht 5' 1.5" (1.562 m)  Wt 190 lb (86.183 kg)  BMI 35.32 kg/m2  SpO2 100%  LMP 08/01/2012  Physical Exam  Constitutional: She is oriented to person, place, and time. She appears well-developed and well-nourished. No distress.  HENT:  Head: Normocephalic and atraumatic.  Mouth/Throat: Oropharynx is clear and moist.  Eyes: Conjunctivae are normal. Pupils are equal, round, and reactive to light.  Neck: Normal range of motion. Neck supple.  Cardiovascular: Normal rate, regular rhythm and intact distal pulses.    Pulmonary/Chest: Effort normal and breath sounds normal. She has no wheezes. She has no rales.  Abdominal: Soft. Bowel sounds are normal.  Genitourinary: Vagina normal. No vaginal discharge found.  Musculoskeletal: Normal range of motion.  Neurological: She is alert and oriented to person, place, and time.  Skin: Skin is warm and dry.  Psychiatric: She has a normal mood and affect.    ED Course  Procedures (including critical care time)  Labs Reviewed  URINALYSIS, ROUTINE W REFLEX MICROSCOPIC - Abnormal; Notable for the following:    Color, Urine AMBER (*)    APPearance CLOUDY (*)    Specific Gravity, Urine 1.033 (*)    Bilirubin Urine SMALL (*)    All other components within normal limits  PREGNANCY, URINE   No results found.   No diagnosis found.    MDM  Will treat with short course of anti inflamatories, follow up with your GYN        Tamieka Rancourt Smitty Cords, MD 08/22/12 3086

## 2012-08-27 ENCOUNTER — Emergency Department (HOSPITAL_BASED_OUTPATIENT_CLINIC_OR_DEPARTMENT_OTHER)
Admission: EM | Admit: 2012-08-27 | Discharge: 2012-08-27 | Disposition: A | Discharge status: Home / Self Care | Payer: Medicaid Other | Attending: Emergency Medicine | Admitting: Emergency Medicine

## 2012-08-27 ENCOUNTER — Encounter (HOSPITAL_BASED_OUTPATIENT_CLINIC_OR_DEPARTMENT_OTHER): Payer: Self-pay | Admitting: *Deleted

## 2012-08-27 DIAGNOSIS — Z79899 Other long term (current) drug therapy: Secondary | ICD-10-CM | POA: Insufficient documentation

## 2012-08-27 DIAGNOSIS — Z8739 Personal history of other diseases of the musculoskeletal system and connective tissue: Secondary | ICD-10-CM | POA: Insufficient documentation

## 2012-08-27 DIAGNOSIS — Z3202 Encounter for pregnancy test, result negative: Secondary | ICD-10-CM | POA: Insufficient documentation

## 2012-08-27 DIAGNOSIS — Z8742 Personal history of other diseases of the female genital tract: Secondary | ICD-10-CM | POA: Insufficient documentation

## 2012-08-27 DIAGNOSIS — R1110 Vomiting, unspecified: Secondary | ICD-10-CM | POA: Insufficient documentation

## 2012-08-27 DIAGNOSIS — G43909 Migraine, unspecified, not intractable, without status migrainosus: Secondary | ICD-10-CM | POA: Insufficient documentation

## 2012-08-27 DIAGNOSIS — F17200 Nicotine dependence, unspecified, uncomplicated: Secondary | ICD-10-CM | POA: Insufficient documentation

## 2012-08-27 DIAGNOSIS — F319 Bipolar disorder, unspecified: Secondary | ICD-10-CM | POA: Insufficient documentation

## 2012-08-27 DIAGNOSIS — R519 Headache, unspecified: Secondary | ICD-10-CM

## 2012-08-27 DIAGNOSIS — J45909 Unspecified asthma, uncomplicated: Secondary | ICD-10-CM | POA: Insufficient documentation

## 2012-08-27 DIAGNOSIS — Z8669 Personal history of other diseases of the nervous system and sense organs: Secondary | ICD-10-CM | POA: Insufficient documentation

## 2012-08-27 MED ORDER — ONDANSETRON HCL 4 MG/2ML IJ SOLN
4.0000 mg | Freq: Once | INTRAMUSCULAR | Status: AC
  Administered 2012-08-27: 4 mg via INTRAVENOUS
  Filled 2012-08-27: qty 2

## 2012-08-27 MED ORDER — KETOROLAC TROMETHAMINE 30 MG/ML IJ SOLN
30.0000 mg | Freq: Once | INTRAMUSCULAR | Status: AC
  Administered 2012-08-27: 30 mg via INTRAVENOUS
  Filled 2012-08-27: qty 1

## 2012-08-27 MED ORDER — SODIUM CHLORIDE 0.9 % IV SOLN
Freq: Once | INTRAVENOUS | Status: AC
  Administered 2012-08-27: 20:00:00 via INTRAVENOUS

## 2012-08-27 NOTE — ED Notes (Signed)
Pt reports she got hot while at a ballgame- c/o feeling shaky and has vomited x 2

## 2012-08-27 NOTE — ED Provider Notes (Signed)
Medical screening examination/treatment/procedure(s) were performed by non-physician practitioner and as supervising physician I was immediately available for consultation/collaboration.   Prashant Glosser, MD 08/27/12 2309 

## 2012-08-27 NOTE — ED Provider Notes (Signed)
History    CSN: [id number] Arrival date & time 08/27/12  1811  First MD Initiated Contact with Patient 08/27/12 1900     Chief Complaint  Patient presents with  . Emesis   (Consider location/radiation/quality/duration/timing/severity/associated sxs/prior Treatment) Patient is a 32 y.o. female presenting with vomiting. The history is provided by the patient. No language interpreter was used.  Emesis Severity:  Moderate Timing:  Constant Number of daily episodes:  2 Able to tolerate:  Liquids Progression:  Worsening Relieved by:  Nothing Worsened by:  Nothing tried Pt complains of a headache and vomitting.   Pt reports symptoms started at ballgame and have progressively worsened.   Pt has a history of migraine headaches Past Medical History  Diagnosis Date  . Migraine   . Depression   . Bipolar 1 disorder   . Otitis media   . Strep throat   . Asthma   . Fibromyalgia 04/2012  . Restless leg syndrome   . Menorrhagia    Past Surgical History  Procedure Laterality Date  . Tonsillectomy    . Cholecystectomy    . Tubal ligation    . Tympanostomy     Family History  Problem Relation Age of Onset  . Depression Mother    History  Substance Use Topics  . Smoking status: Current Every Day Smoker -- 1.00 packs/day    Types: Cigarettes  . Smokeless tobacco: Never Used  . Alcohol Use: No   OB History   Grav Para Term Preterm Abortions TAB SAB Ect Mult Living   6 3 2 1 3  3   3      Review of Systems  Gastrointestinal: Positive for vomiting.  All other systems reviewed and are negative.    Allergies  Morphine and related and Reglan  Home Medications   Current Outpatient Rx  Name  Route  Sig  Dispense  Refill  . busPIRone (BUSPAR) 5 MG tablet   Oral   Take 5 mg by mouth 2 (two) times daily.         . cetirizine (ZYRTEC) 10 MG tablet   Oral   Take 10 mg by mouth daily.          Marland Kitchen desvenlafaxine (PRISTIQ) 50 MG 24 hr tablet   Oral   Take 50 mg by mouth  daily.         Marland Kitchen gabapentin (NEURONTIN) 300 MG capsule   Oral   Take 600 mg by mouth 3 (three) times daily.          Marland Kitchen ibuprofen (ADVIL,MOTRIN) 800 MG tablet   Oral   Take 1 tablet (800 mg total) by mouth every 8 (eight) hours as needed for pain.   30 tablet   0   . methocarbamol (ROBAXIN) 500 MG tablet      Take one or two pills every 6 hours as needed for pain   10 tablet   0   . QUEtiapine (SEROQUEL) 25 MG tablet   Oral   Take 25 mg by mouth 2 (two) times daily.         Marland Kitchen tiZANidine (ZANAFLEX) 2 MG tablet   Oral   Take 2 mg by mouth every 8 (eight) hours as needed.         . topiramate (TOPAMAX) 25 MG tablet   Oral   Take 75 mg by mouth 2 (two) times daily.          . ARIPiprazole (ABILIFY) 5 MG tablet  Oral   Take 5 mg by mouth daily.          . cyclobenzaprine (FLEXERIL) 10 MG tablet   Oral   Take 10 mg by mouth 2 (two) times daily as needed for muscle spasms.         . medroxyPROGESTERone (PROVERA) 10 MG tablet   Oral   Take 2 tablets (20 mg total) by mouth daily.   30 tablet   1   . naproxen (NAPROSYN) 375 MG tablet   Oral   Take 1 tablet (375 mg total) by mouth 2 (two) times daily with a meal.   14 tablet   0   . traMADol (ULTRAM) 50 MG tablet   Oral   Take 50 mg by mouth every 6 (six) hours as needed for pain.          BP 122/82  Pulse 95  Temp(Src) 98 F (36.7 C) (Oral)  Resp 20  Ht 5\' 1"  (1.549 m)  Wt 190 lb (86.183 kg)  BMI 35.92 kg/m2  SpO2 100%  LMP 08/01/2012 Physical Exam  Nursing note and vitals reviewed. Constitutional: She is oriented to person, place, and time. She appears well-developed and well-nourished.  HENT:  Head: Normocephalic and atraumatic.  Eyes: Pupils are equal, round, and reactive to light.  Neck: Normal range of motion.  Cardiovascular: Normal rate and normal heart sounds.   Pulmonary/Chest: Effort normal and breath sounds normal.  Abdominal: Soft.  Musculoskeletal: Normal range of motion.   Neurological: She is alert and oriented to person, place, and time. She has normal reflexes.  Skin: Skin is warm.  Psychiatric: She has a normal mood and affect.    ED Course  Procedures (including critical care time) Labs Reviewed  URINALYSIS, ROUTINE W REFLEX MICROSCOPIC  PREGNANCY, URINE   No results found. No diagnosis found.  MDM  Pt given Iv fluids, torodol and zofran.   Pt feels much better,  No headache now  Elson Areas, [place] 08/27/12 2118

## 2012-10-02 ENCOUNTER — Encounter: Payer: Self-pay | Admitting: Obstetrics & Gynecology

## 2012-10-02 ENCOUNTER — Ambulatory Visit (INDEPENDENT_AMBULATORY_CARE_PROVIDER_SITE_OTHER): Payer: Medicaid Other | Admitting: Obstetrics & Gynecology

## 2012-10-02 VITALS — BP 116/80 | HR 97 | Temp 98.9°F | Resp 20 | Ht 61.0 in | Wt 187.9 lb

## 2012-10-02 DIAGNOSIS — N946 Dysmenorrhea, unspecified: Secondary | ICD-10-CM

## 2012-10-02 DIAGNOSIS — N949 Unspecified condition associated with female genital organs and menstrual cycle: Secondary | ICD-10-CM

## 2012-10-02 NOTE — Progress Notes (Signed)
  Subjective:    Patient ID: Tiffany Whitney, female    DOB: 1980-08-24, 32 y.o.   MRN: [id number]  HPI  32 yo MW G6P3 (11,15,32 yo) + a 26 yo stepdaughter here today for her preop visit. She has a LAVH scheduled 10/08/12. We have discussed the risks of surgery including but not limited to infection, bleeding, damage to bowel, bladder, ureters. She understands the risk of DVTs.  Review of Systems She is a homemaker, pap smear normal 2/14. She denies dyspareunia and is orgasmic. She denies chronic constipation.    Objective:   Physical Exam        Assessment & Plan:  Pre op visit done. All questions answered.

## 2012-10-07 ENCOUNTER — Encounter (HOSPITAL_COMMUNITY): Payer: Self-pay | Admitting: Pharmacy Technician

## 2012-10-07 ENCOUNTER — Encounter (HOSPITAL_COMMUNITY): Payer: Self-pay

## 2012-10-07 ENCOUNTER — Encounter (HOSPITAL_COMMUNITY)
Admission: RE | Admit: 2012-10-07 | Discharge: 2012-10-07 | Disposition: A | Discharge status: Home / Self Care | Payer: Medicaid Other | Source: Ambulatory Visit | Attending: Obstetrics & Gynecology | Admitting: Obstetrics & Gynecology

## 2012-10-07 HISTORY — DX: Anxiety disorder, unspecified: F41.9

## 2012-10-07 LAB — CBC
HCT: 41.7 % (ref 36.0–46.0)
Hemoglobin: 14.6 g/dL (ref 12.0–15.0)
MCHC: 35 g/dL (ref 30.0–36.0)
MCV: 90.1 fL (ref 78.0–100.0)

## 2012-10-07 NOTE — Patient Instructions (Signed)
Your procedure is scheduled on:10/08/12  Enter through the Main Entrance at :0830 am Pick up desk phone and dial [phone] and inform us of your arrival.  Please call [phone] if you have any problems the morning of surgery.  Remember: Do not eat food or drink liquids, including water, after midnight:ttonight   You may brush your teeth the morning of surgery.  Take these meds the morning of surgery with a sip of water:usual am meds  DO NOT wear jewelry, eye make-up, lipstick,body lotion, or dark fingernail polish.  (Polished toes are ok) You may wear deodorant.  If you are to be admitted after surgery, leave suitcase in car until your room has been assigned. Patients discharged on the day of surgery will not be allowed to drive home. Wear loose fitting, comfortable clothes for your ride home.

## 2012-10-08 ENCOUNTER — Encounter (HOSPITAL_COMMUNITY): Payer: Self-pay | Admitting: Anesthesiology

## 2012-10-08 ENCOUNTER — Encounter (HOSPITAL_COMMUNITY)
Admission: RE | Disposition: A | Discharge status: Home / Self Care | Payer: Self-pay | Source: Ambulatory Visit | Attending: Obstetrics & Gynecology

## 2012-10-08 ENCOUNTER — Ambulatory Visit (HOSPITAL_COMMUNITY)
Admission: RE | Admit: 2012-10-08 | Discharge: 2012-10-08 | Disposition: A | Discharge status: Home / Self Care | Payer: Medicaid Other | Source: Ambulatory Visit | Attending: Obstetrics & Gynecology | Admitting: Obstetrics & Gynecology

## 2012-10-08 ENCOUNTER — Ambulatory Visit (HOSPITAL_COMMUNITY): Payer: Medicaid Other | Admitting: Anesthesiology

## 2012-10-08 DIAGNOSIS — N920 Excessive and frequent menstruation with regular cycle: Secondary | ICD-10-CM | POA: Insufficient documentation

## 2012-10-08 DIAGNOSIS — R102 Pelvic and perineal pain: Secondary | ICD-10-CM

## 2012-10-08 DIAGNOSIS — Z8742 Personal history of other diseases of the female genital tract: Secondary | ICD-10-CM

## 2012-10-08 DIAGNOSIS — Z9889 Other specified postprocedural states: Secondary | ICD-10-CM

## 2012-10-08 DIAGNOSIS — N949 Unspecified condition associated with female genital organs and menstrual cycle: Secondary | ICD-10-CM | POA: Insufficient documentation

## 2012-10-08 DIAGNOSIS — N854 Malposition of uterus: Secondary | ICD-10-CM | POA: Insufficient documentation

## 2012-10-08 DIAGNOSIS — N946 Dysmenorrhea, unspecified: Secondary | ICD-10-CM

## 2012-10-08 HISTORY — PX: BILATERAL SALPINGECTOMY: SHX5743

## 2012-10-08 HISTORY — PX: LAPAROSCOPIC ASSISTED VAGINAL HYSTERECTOMY: SHX5398

## 2012-10-08 LAB — PREGNANCY, URINE: Preg Test, Ur: NEGATIVE

## 2012-10-08 LAB — ABO/RH: ABO/RH(D): A POS

## 2012-10-08 LAB — TYPE AND SCREEN
ABO/RH(D): A POS
Antibody Screen: NEGATIVE

## 2012-10-08 SURGERY — HYSTERECTOMY, VAGINAL, LAPAROSCOPY-ASSISTED
Anesthesia: General | Site: Abdomen | Wound class: Clean Contaminated

## 2012-10-08 MED ORDER — FENTANYL CITRATE 0.05 MG/ML IJ SOLN
INTRAMUSCULAR | Status: DC | PRN
  Administered 2012-10-08 (×3): 50 ug via INTRAVENOUS
  Administered 2012-10-08: 100 ug via INTRAVENOUS

## 2012-10-08 MED ORDER — KETOROLAC TROMETHAMINE 30 MG/ML IJ SOLN: 30.0000 mg | Freq: Once | INTRAMUSCULAR | Status: DC

## 2012-10-08 MED ORDER — DEXAMETHASONE SODIUM PHOSPHATE 10 MG/ML IJ SOLN
INTRAMUSCULAR | Status: AC
  Filled 2012-10-08: qty 1

## 2012-10-08 MED ORDER — GLYCOPYRROLATE 0.2 MG/ML IJ SOLN
INTRAMUSCULAR | Status: AC
  Filled 2012-10-08: qty 3

## 2012-10-08 MED ORDER — ROCURONIUM BROMIDE 50 MG/5ML IV SOLN
INTRAVENOUS | Status: AC
  Filled 2012-10-08: qty 1

## 2012-10-08 MED ORDER — CEFAZOLIN SODIUM-DEXTROSE 2-3 GM-% IV SOLR
INTRAVENOUS | Status: AC
  Administered 2012-10-08: 2 g via INTRAVENOUS
  Filled 2012-10-08: qty 50

## 2012-10-08 MED ORDER — HYDROMORPHONE HCL PF 1 MG/ML IJ SOLN
INTRAMUSCULAR | Status: DC | PRN
  Administered 2012-10-08: 1 mg via INTRAVENOUS

## 2012-10-08 MED ORDER — FENTANYL CITRATE 0.05 MG/ML IJ SOLN
25.0000 ug | INTRAMUSCULAR | Status: DC | PRN
  Administered 2012-10-08 (×2): 50 ug via INTRAVENOUS

## 2012-10-08 MED ORDER — LACTATED RINGERS IV SOLN: INTRAVENOUS | Status: DC

## 2012-10-08 MED ORDER — NEOSTIGMINE METHYLSULFATE 1 MG/ML IJ SOLN
INTRAMUSCULAR | Status: AC
  Filled 2012-10-08: qty 1

## 2012-10-08 MED ORDER — SODIUM CHLORIDE 0.9 % IJ SOLN
INTRAMUSCULAR | Status: DC | PRN
  Administered 2012-10-08: 5 mL via INTRAVENOUS

## 2012-10-08 MED ORDER — CEFAZOLIN SODIUM-DEXTROSE 2-3 GM-% IV SOLR: 2.0000 g | INTRAVENOUS | Status: DC

## 2012-10-08 MED ORDER — HYDROMORPHONE HCL PF 1 MG/ML IJ SOLN
INTRAMUSCULAR | Status: AC
  Filled 2012-10-08: qty 1

## 2012-10-08 MED ORDER — INDIGOTINDISULFONATE SODIUM 8 MG/ML IJ SOLN
INTRAMUSCULAR | Status: AC
  Filled 2012-10-08: qty 5

## 2012-10-08 MED ORDER — OXYCODONE-ACETAMINOPHEN 5-325 MG PO TABS: 1.0000 | ORAL_TABLET | Freq: Four times a day (QID) | ORAL | Status: DC | PRN

## 2012-10-08 MED ORDER — IBUPROFEN 600 MG PO TABS: 600.0000 mg | ORAL_TABLET | Freq: Four times a day (QID) | ORAL | Status: DC | PRN

## 2012-10-08 MED ORDER — LIDOCAINE HCL (CARDIAC) 20 MG/ML IV SOLN
INTRAVENOUS | Status: DC | PRN
  Administered 2012-10-08: 80 mg via INTRAVENOUS

## 2012-10-08 MED ORDER — KETOROLAC TROMETHAMINE 30 MG/ML IJ SOLN: 30.0000 mg | Freq: Four times a day (QID) | INTRAMUSCULAR | Status: DC

## 2012-10-08 MED ORDER — OXYCODONE-ACETAMINOPHEN 5-325 MG PO TABS
1.0000 | ORAL_TABLET | ORAL | Status: DC | PRN
  Administered 2012-10-08: 2 via ORAL
  Filled 2012-10-08: qty 2

## 2012-10-08 MED ORDER — ROCURONIUM BROMIDE 100 MG/10ML IV SOLN
INTRAVENOUS | Status: DC | PRN
  Administered 2012-10-08: 35 mg via INTRAVENOUS
  Administered 2012-10-08: 5 mg via INTRAVENOUS

## 2012-10-08 MED ORDER — LACTATED RINGERS IV SOLN
INTRAVENOUS | Status: DC
  Administered 2012-10-08 (×3): via INTRAVENOUS

## 2012-10-08 MED ORDER — MIDAZOLAM HCL 5 MG/5ML IJ SOLN
INTRAMUSCULAR | Status: DC | PRN
  Administered 2012-10-08: 2 mg via INTRAVENOUS

## 2012-10-08 MED ORDER — INDIGOTINDISULFONATE SODIUM 8 MG/ML IJ SOLN
INTRAMUSCULAR | Status: DC | PRN
  Administered 2012-10-08: 3 mL via INTRAVENOUS

## 2012-10-08 MED ORDER — ONDANSETRON HCL 4 MG/2ML IJ SOLN
INTRAMUSCULAR | Status: DC | PRN
  Administered 2012-10-08: 4 mg via INTRAVENOUS

## 2012-10-08 MED ORDER — FENTANYL CITRATE 0.05 MG/ML IJ SOLN
INTRAMUSCULAR | Status: AC
  Filled 2012-10-08: qty 5

## 2012-10-08 MED ORDER — FENTANYL CITRATE 0.05 MG/ML IJ SOLN
INTRAMUSCULAR | Status: AC
  Administered 2012-10-08: 50 ug via INTRAVENOUS
  Filled 2012-10-08: qty 2

## 2012-10-08 MED ORDER — VASOPRESSIN 20 UNIT/ML IJ SOLN
INTRAVENOUS | Status: DC | PRN
  Administered 2012-10-08: 11:00:00 via INTRAMUSCULAR

## 2012-10-08 MED ORDER — KETOROLAC TROMETHAMINE 30 MG/ML IJ SOLN
30.0000 mg | Freq: Four times a day (QID) | INTRAMUSCULAR | Status: DC
  Administered 2012-10-08: 30 mg via INTRAVENOUS
  Filled 2012-10-08: qty 1

## 2012-10-08 MED ORDER — BUPIVACAINE HCL (PF) 0.5 % IJ SOLN
INTRAMUSCULAR | Status: DC | PRN
  Administered 2012-10-08: 20 mL

## 2012-10-08 MED ORDER — SUCCINYLCHOLINE CHLORIDE 20 MG/ML IJ SOLN
INTRAMUSCULAR | Status: AC
  Filled 2012-10-08: qty 10

## 2012-10-08 MED ORDER — ESTRADIOL 0.1 MG/GM VA CREA
TOPICAL_CREAM | VAGINAL | Status: AC
  Filled 2012-10-08: qty 42.5

## 2012-10-08 MED ORDER — VASOPRESSIN 20 UNIT/ML IJ SOLN
INTRAMUSCULAR | Status: AC
  Filled 2012-10-08: qty 1

## 2012-10-08 MED ORDER — PROPOFOL 10 MG/ML IV EMUL
INTRAVENOUS | Status: AC
  Filled 2012-10-08: qty 20

## 2012-10-08 MED ORDER — BUPIVACAINE HCL (PF) 0.5 % IJ SOLN
INTRAMUSCULAR | Status: AC
  Filled 2012-10-08: qty 30

## 2012-10-08 MED ORDER — ROPIVACAINE HCL 5 MG/ML IJ SOLN
INTRAMUSCULAR | Status: AC
  Filled 2012-10-08: qty 30

## 2012-10-08 MED ORDER — GLYCOPYRROLATE 0.2 MG/ML IJ SOLN
INTRAMUSCULAR | Status: DC | PRN
  Administered 2012-10-08: 0.6 mg via INTRAVENOUS
  Administered 2012-10-08: 0.1 mg via INTRAVENOUS

## 2012-10-08 MED ORDER — DEXTROSE-NACL 5-0.45 % IV SOLN
INTRAVENOUS | Status: DC
  Administered 2012-10-08: 17:00:00 via INTRAVENOUS

## 2012-10-08 MED ORDER — MIDAZOLAM HCL 2 MG/2ML IJ SOLN
INTRAMUSCULAR | Status: AC
  Filled 2012-10-08: qty 2

## 2012-10-08 MED ORDER — SUCCINYLCHOLINE CHLORIDE 20 MG/ML IJ SOLN
INTRAMUSCULAR | Status: DC | PRN
  Administered 2012-10-08: 100 mg via INTRAVENOUS

## 2012-10-08 MED ORDER — ONDANSETRON HCL 4 MG/2ML IJ SOLN
INTRAMUSCULAR | Status: AC
  Filled 2012-10-08: qty 2

## 2012-10-08 MED ORDER — PROPOFOL 10 MG/ML IV BOLUS
INTRAVENOUS | Status: DC | PRN
  Administered 2012-10-08: 50 mg via INTRAVENOUS
  Administered 2012-10-08: 150 mg via INTRAVENOUS

## 2012-10-08 MED ORDER — HYDROMORPHONE HCL PF 1 MG/ML IJ SOLN
0.2000 mg | INTRAMUSCULAR | Status: DC | PRN
  Administered 2012-10-08: 0.6 mg via INTRAVENOUS
  Filled 2012-10-08: qty 1

## 2012-10-08 MED ORDER — MENTHOL 3 MG MT LOZG: 1.0000 | LOZENGE | OROMUCOSAL | Status: DC | PRN

## 2012-10-08 MED ORDER — DEXAMETHASONE SODIUM PHOSPHATE 10 MG/ML IJ SOLN
INTRAMUSCULAR | Status: DC | PRN
  Administered 2012-10-08: 10 mg via INTRAVENOUS

## 2012-10-08 MED ORDER — NEOSTIGMINE METHYLSULFATE 1 MG/ML IJ SOLN
INTRAMUSCULAR | Status: DC | PRN
  Administered 2012-10-08: 4 mg via INTRAVENOUS

## 2012-10-08 MED ORDER — LIDOCAINE HCL (CARDIAC) 20 MG/ML IV SOLN
INTRAVENOUS | Status: AC
  Filled 2012-10-08: qty 5

## 2012-10-08 SURGICAL SUPPLY — 47 items
CABLE HIGH FREQUENCY MONO STRZ (ELECTRODE) IMPLANT
CATH ROBINSON RED A/P 16FR (CATHETERS) IMPLANT
CHLORAPREP W/TINT 26ML (MISCELLANEOUS) ×3 IMPLANT
CLOTH BEACON ORANGE TIMEOUT ST (SAFETY) ×3 IMPLANT
CONT PATH 16OZ SNAP LID 3702 (MISCELLANEOUS) ×3 IMPLANT
COVER MAYO STAND STRL (DRAPES) ×3 IMPLANT
COVER TABLE BACK 60X90 (DRAPES) ×3 IMPLANT
DECANTER SPIKE VIAL GLASS SM (MISCELLANEOUS) ×3 IMPLANT
ELECT REM PT RETURN 9FT ADLT (ELECTROSURGICAL) ×3
ELECTRODE REM PT RTRN 9FT ADLT (ELECTROSURGICAL) ×2 IMPLANT
GAUZE PACKING 2X5 YD STERILE (GAUZE/BANDAGES/DRESSINGS) IMPLANT
GLOVE BIO SURGEON STRL SZ7 (GLOVE) ×9 IMPLANT
GLOVE BIOGEL PI IND STRL 6.5 (GLOVE) ×2 IMPLANT
GLOVE BIOGEL PI IND STRL 7.0 (GLOVE) ×2 IMPLANT
GLOVE BIOGEL PI INDICATOR 6.5 (GLOVE) ×1
GLOVE BIOGEL PI INDICATOR 7.0 (GLOVE) ×1
GOWN STRL REIN XL XLG (GOWN DISPOSABLE) ×15 IMPLANT
MANIPULATOR UTERINE 4.5 ZUMI (MISCELLANEOUS) ×3 IMPLANT
NEEDLE INSUFFLATION 120MM (ENDOMECHANICALS) ×3 IMPLANT
NEEDLE INSUFFLATION 14GA 120MM (NEEDLE) ×3 IMPLANT
NEEDLE SPNL 18GX3.5 QUINCKE PK (NEEDLE) ×3 IMPLANT
NS IRRIG 1000ML POUR BTL (IV SOLUTION) ×3 IMPLANT
PACK LAVH (CUSTOM PROCEDURE TRAY) ×3 IMPLANT
PROTECTOR NERVE ULNAR (MISCELLANEOUS) ×6 IMPLANT
SCALPEL HARMONIC ACE (MISCELLANEOUS) ×3 IMPLANT
SCISSORS LAP 5X35 DISP (ENDOMECHANICALS) IMPLANT
SET CYSTO W/LG BORE CLAMP LF (SET/KITS/TRAYS/PACK) ×3 IMPLANT
SET IRRIG TUBING LAPAROSCOPIC (IRRIGATION / IRRIGATOR) IMPLANT
SOLUTION ELECTROLUBE (MISCELLANEOUS) IMPLANT
STRIP CLOSURE SKIN 1/4X3 (GAUZE/BANDAGES/DRESSINGS) IMPLANT
SUT VIC AB 0 CT1 18XCR BRD8 (SUTURE) ×4 IMPLANT
SUT VIC AB 0 CT1 36 (SUTURE) ×6 IMPLANT
SUT VIC AB 0 CT1 8-18 (SUTURE) ×2
SUT VIC AB 3-0 SH 18 (SUTURE) IMPLANT
SUT VIC AB 3-0 X1 27 (SUTURE) ×3 IMPLANT
SUT VICRYL 0 TIES 12 18 (SUTURE) ×3 IMPLANT
SUT VICRYL 0 UR6 27IN ABS (SUTURE) IMPLANT
SUT VICRYL 4-0 PS2 18IN ABS (SUTURE) ×3 IMPLANT
SYR 30ML LL (SYRINGE) ×3 IMPLANT
SYR 50ML LL SCALE MARK (SYRINGE) IMPLANT
SYR 5ML LL (SYRINGE) ×3 IMPLANT
TOWEL OR 17X24 6PK STRL BLUE (TOWEL DISPOSABLE) ×6 IMPLANT
TRAY FOLEY CATH 14FR (SET/KITS/TRAYS/PACK) ×3 IMPLANT
TROCAR BALLN 12MMX100 BLUNT (TROCAR) ×3 IMPLANT
TROCAR OPTI TIP 5M 100M (ENDOMECHANICALS) ×9 IMPLANT
WARMER LAPAROSCOPE (MISCELLANEOUS) ×3 IMPLANT
WATER STERILE IRR 1000ML POUR (IV SOLUTION) ×3 IMPLANT

## 2012-10-08 NOTE — Anesthesia Postprocedure Evaluation (Signed)
  Anesthesia Post-op Note  Anesthesia Post Note  Patient: Tiffany Whitney  Procedure(s) Performed: Procedure(s) (LRB): LAPAROSCOPIC ASSISTED VAGINAL HYSTERECTOMY (N/A) BILATERAL SALPINGECTOMY (Bilateral)  Anesthesia type: General  Patient location: [facility]  Post pain: Pain level controlled  Post assessment: Post-op Vital signs reviewed  Last Vitals:  Filed Vitals:   10/08/12 1518  BP: 105/67  Pulse: 83  Temp: 36.6 C  Resp: 18    Post vital signs: Reviewed  Level of consciousness: sedated  Complications: No apparent anesthesia complications

## 2012-10-08 NOTE — Brief Op Note (Signed)
10/08/2012  12:02 PM  PATIENT:  Tiffany Whitney  32 y.o. female  PRE-OPERATIVE DIAGNOSIS:  Dysmenorrhea; pelvic pain  POST-OPERATIVE DIAGNOSIS:  Dysmenorrhea; pelvic pain  PROCEDURE:  Procedure(s): LAPAROSCOPIC ASSISTED VAGINAL HYSTERECTOMY (N/A) BILATERAL SALPINGECTOMY (Bilateral)  SURGEON:  Surgeon(s) and Role:    * Willodean Rosenthal, MD - Primary    * Allie Bossier, MD - Assisting  ANESTHESIA:   local and general  EBL:  Total I/O In: 2000 [I.V.:2000] Out: 625 [Urine:525; Blood:100]  BLOOD ADMINISTERED:none  DRAINS: none   LOCAL MEDICATIONS USED:  MARCAINE     SPECIMEN:  Source of Specimen:  uterus, cervix and fallopian tubes bilaterally  DISPOSITION OF SPECIMEN:  PATHOLOGY  COUNTS:  YES  TOURNIQUET:  * No tourniquets in log *  DICTATION: .Note written in EPIC  PLAN OF CARE: 6-8 hours observation and the discharge to home  PATIENT DISPOSITION:  PACU - hemodynamically stable.   Delay start of Pharmacological VTE agent (>24hrs) due to surgical blood loss or risk of bleeding: yes

## 2012-10-08 NOTE — H&P (Signed)
HPI Pt with h/o menorrhagia and dysmenorrhea. She reports going through pads q 1 hour while on cycle for the first 2 days. She wants resolution of the pain and bleeding. She tried the Mirena IUD with increased pain so it was removed after 1 day. Past Medical History   Diagnosis  Date   .  Migraine    .  Depression    .  Bipolar 1 disorder    .  Otitis media    .  Strep throat    .  Asthma    .  Fibromyalgia  04/2012    Past Surgical History   Procedure  Laterality  Date   .  Tonsillectomy     .  Cholecystectomy     .  Exploratory laparotomy     .  Tubal ligation     .  Exploratory laparotomy     .  Tympanostomy      Current Outpatient Prescriptions on File Prior to Visit   Medication  Sig  Dispense  Refill   .  ARIPiprazole (ABILIFY) 5 MG tablet  Take 5 mg by mouth daily.     Marland Kitchen  aspirin-acetaminophen-caffeine (EXCEDRIN MIGRAINE) 250-250-65 MG per tablet  Take 2 tablets by mouth every 6 (six) hours as needed for pain.     .  Aspirin-Acetaminophen-Caffeine (GOODY HEADACHE PO)  Take 1 packet by mouth once. Patient used this medication for pain.     .  busPIRone (BUSPAR) 15 MG tablet  Take 15 mg by mouth 2 (two) times daily.     .  cetirizine (ZYRTEC) 10 MG tablet  Take 10 mg by mouth daily. Patient uses this medication for her allergies.     Marland Kitchen  gabapentin (NEURONTIN) 300 MG capsule  Take 300 mg by mouth 4 (four) times daily.     .  hydrOXYzine (ATARAX/VISTARIL) 25 MG tablet  Take 25 mg by mouth 2 (two) times daily.     Marland Kitchen  topiramate (TOPAMAX) 25 MG tablet  Take 75 mg by mouth 2 (two) times daily.     .  clonazePAM (KLONOPIN) 0.5 MG tablet  Take 0.5 mg by mouth 2 (two) times daily as needed.     .  desvenlafaxine (PRISTIQ) 50 MG 24 hr tablet  Take 100 mg by mouth daily.     .  pregabalin (LYRICA) 75 MG capsule  Take 75 mg by mouth 2 (two) times daily.     .  promethazine (PHENERGAN) 25 MG tablet  Take 25 mg by mouth every 6 (six) hours as needed.      No current facility-administered  medications on file prior to visit.    Allergies   Allergen  Reactions   .  Morphine And Related  Hives   .  Reglan (Metoclopramide)      jittery    History    Social History   .  Marital Status:  Married     Spouse Name:  N/A     Number of Children:  N/A   .  Years of Education:  N/A    Occupational History   .  Not on file.    Social History Main Topics   .  Smoking status:  Current Every Day Smoker -- 1.00 packs/day     Types:  Cigarettes   .  Smokeless tobacco:  Never Used   .  Alcohol Use:  No   .  Drug Use:  No   .  Sexually Active:  Yes     Birth Control/ Protection:  Surgical    Other Topics  Concern   .  Not on file    Social History Narrative   .  No narrative on file   Review of Systems  Objective:   Physical Exam BP 128/77  Pulse 82  Temp(Src) 97.7 F (36.5 C)  Resp 20  SpO2 99% Lungs: CTA CV: RRR Abd: obese, NT, ND Gyn: not repeated today   CBC    Component  Value  Date/Time    WBC  12.5*  03/13/2012 0900    RBC  4.82  03/13/2012 0900    HGB  14.9  03/13/2012 0900    HCT  43.2  03/13/2012 0900    PLT  298  03/13/2012 0900    MCV  89.6  03/13/2012 0900    MCH  30.9  03/13/2012 0900    MCHC  34.5  03/13/2012 0900    RDW  12.8  03/13/2012 0900    LYMPHSABS  2.4  03/13/2012 0900    MONOABS  1.0  03/13/2012 0900    EOSABS  0.6  03/13/2012 0900    BASOSABS  0.1  03/13/2012 0900    CBC    Component Value Date/Time   WBC 11.4* 10/07/2012 1010   RBC 4.63 10/07/2012 1010   HGB 14.6 10/07/2012 1010   HCT 41.7 10/07/2012 1010   PLT 290 10/07/2012 1010   MCV 90.1 10/07/2012 1010   MCH 31.5 10/07/2012 1010   MCHC 35.0 10/07/2012 1010   RDW 13.8 10/07/2012 1010   LYMPHSABS 2.4 03/13/2012 0900   MONOABS 1.0 03/13/2012 0900   EOSABS 0.6 03/13/2012 0900   BASOSABS 0.1 03/13/2012 0900     12/18/2011  Sono:  Clinical Data: Question tubo-ovarian abscess. Worsening pelvic  pain since being diagnosed with cervicitis on 12/17/2011. Post  bilateral tubal ligation. LMP 11/28/2011   TRANSABDOMINAL AND TRANSVAGINAL ULTRASOUND OF PELVIS  Technique: Both transabdominal and transvaginal ultrasound  examinations of the pelvis were performed. Transabdominal technique  was performed for global imaging of the pelvis including uterus,  ovaries, adnexal regions, and pelvic cul-de-sac.  It was necessary to proceed with endovaginal exam following the  transabdominal exam to visualize the myometrium, endometrium and  adnexa.  Comparison: 03/01/2009  Findings:  Uterus: Is retroverted and retroflexed and demonstrates a sagittal  length of 8.4 cm, depth of 5.1 cm and width of 4.9 cm. A  homogeneous myometrium is seen  Endometrium: Appears trilayered with a width of 12 mm. No areas of  focal thickening or heterogeneity are seen and this would correlate  with a periovulatory endometrial stripe and correspond with the  provided LMP of 11/28/2011  Right ovary: Has a normal appearance measuring 3.7 x 2.0 x 1.8 cm  Left ovary: Has a normal appearance measuring 3.5 x 2.0 x 1.7 cm  Other findings: A trace of simple free fluid is noted in the right  adnexa. No separate adnexal masses are identified.  IMPRESSION:  Normal periovulatory pelvic ultrasound with no sonographic stigmata  suggestive of pelvic inflammatory disease or tubo-ovarian abscess  identified.  05/03/2012  PAP Diagnosis  NEGATIVE FOR INTRAEPITHELIAL LESIONS OR MALIGNANCY.  Assessment:   Menorrhagia/dysmenorrhea-unresponsive to conservative measures.  Pt opts for definitive treatment with hysterectomy.  Due to pts h/o PID, will perform laparoscopy prior to procedure.  Plan:   Patient desires surgical management with laparoscopic assisted vaginal hysterectomy with bilateral salpingectomy.  The risks of surgery were discussed in detail  with the patient and her husband including but not limited to: bleeding which may require transfusion or reoperation; infection which may require prolonged hospitalization or re-hospitalization  and antibiotic therapy; injury to bowel, bladder, ureters and major vessels or other surrounding organs; need for additional procedures including laparotomy; thromboembolic phenomenon, incisional problems and other postoperative or anesthesia complications.  Patient was told that the likelihood that her condition and symptoms will be treated effectively with this surgical management was very high; the postoperative expectations were also discussed in detail. The patient also understands the alternative treatment options which were discussed in full. All questions were answered.  Will proceed with surgery as described.

## 2012-10-08 NOTE — OR Nursing (Addendum)
Pt positioned checked during case all extremities warm, dry and with out pressure points.Hip, knee and ankle are appropriately aligned . Knees slightly bent.Tucked upper extremities have elbows secure close to trunk  no pressure points noted.

## 2012-10-08 NOTE — Anesthesia Procedure Notes (Signed)
Date/Time: 10/08/2012 10:20 AM Performed by: Graciela Husbands Pre-anesthesia Checklist: Patient being monitored, Patient identified, Emergency Drugs available, Timeout performed and Suction available Patient Re-evaluated:Patient Re-evaluated prior to inductionOxygen Delivery Method: Circle system utilized Preoxygenation: Pre-oxygenation with 100% oxygen Intubation Type: IV induction Ventilation: Mask ventilation without difficulty Grade View: Grade I Tube size: 7.0 mm Number of attempts: 1 Airway Equipment and Method: Patient positioned with wedge pillow and Video-laryngoscopy (# 3) Placement Confirmation: ETT inserted through vocal cords under direct vision,  breath sounds checked- equal and bilateral and positive ETCO2 Secured at: 21 cm Tube secured with: Tape Dental Injury: Teeth and Oropharynx as per pre-operative assessment  Difficulty Due To: Difficulty was anticipated and Difficult Airway- due to limited oral opening

## 2012-10-08 NOTE — Op Note (Signed)
10/08/2012  12:02 PM  PATIENT: Tiffany Whitney 32 y.o. female  PRE-OPERATIVE DIAGNOSIS: Dysmenorrhea; pelvic pain  POST-OPERATIVE DIAGNOSIS: Dysmenorrhea; pelvic pain  PROCEDURE: Procedure(s):  LAPAROSCOPIC ASSISTED VAGINAL HYSTERECTOMY (N/A)  BILATERAL SALPINGECTOMY (Bilateral)  SURGEON: Surgeon(s) and Role:  * Willodean Rosenthal, MD - Primary  * Allie Bossier, MD - Assisting  ANESTHESIA: local and general  EBL: Total I/O  In: 2000 [I.V.:2000]  Out: 625 [Urine:525; Blood:100]  BLOOD ADMINISTERED:none  DRAINS: none  LOCAL MEDICATIONS USED: MARCAINE  SPECIMEN: Source of Specimen: uterus, cervix and fallopian tubes bilaterally  DISPOSITION OF SPECIMEN: PATHOLOGY  COUNTS: YES  TOURNIQUET: * No tourniquets in log *  INDICATIONS: 32yo female with aforementioned preoprative diagnoses here today for definitive surgical management.   Risks of surgery were discussed with the patient and her spouse.  The risks reviewed were  including but not limited to: bleeding which may require transfusion or reoperation; infection which may require antibiotics; injury to bowel, bladder, ureters or other surrounding organs; need for additional procedures including laparotomy; thromboembolic phenomenon, incisional problems and other postoperative/anesthesia complications. Written informed consent was obtained.    FINDINGS: 10 week sized uterus.  Normal ovaries and fallopian tubes.    PROCEDURE IN DETAIL:  The patient received intravenous antibiotics and had sequential compression devices applied to her lower extremities while in the preoperative area.  She was then taken to the operating room where general anesthesia was administered and was found to be adequate.  She was placed in the dorsal lithotomy position, and was prepped and draped in a sterile manner. After an adequate timeout was performed, a Foley catheter was inserted into her bladder and attached to constant drainage and a HUMI uterine manipulator was  then advanced into the uterus .  , attention was then turned to the patient's abdomen where a 5-mm skin incision was made in the umbilicus.  A Veress needle was placed through this incision placement was confirmed using a syringe with normal saline.  A pneumoperitoneum of CO2 gas was used to insufflate the abdomen. A 5mm trocar was then placed through the incision.  After intraperitoneal placement was confirmed a pneumoperitoneum was obtained with carbon dioxide gas.  A survey of the patient's pelvis and abdomen revealed the above anatomy.   Bilateral 5-mm lower quadrant ports  were then placed under direct visualization.  The pelvis was then carefully examined.   On the left side the round ligament was clamped, transected and ligated  with the Harmonic scapel device.  The uteroovarian ligament was also clamped and transected, and the fallopian tube on the right  was freed from the underlying mesosalpinx.  Excellent hemostasis was noted.  The Whitney ligament was serially clamped using the Harmonic to below  the level of the uterine artieries. Excellent hemostasis was noted.  The same was carried out on the right side with excellent hemostasis. The decision was made to leave the trocars in place and proceed with completing the hysterectomy via the vaginal route .  Attention was then turned to her pelvis.  A weighted speculum was then placed in the vagina.  The anterior and posterior lips of the cervix were grasped bilaterally with Gerilyn Pilgrim tenaculums. The cervix was then injected circumferentially with dilute vasopressin solution.  The cervix was then circumferentially incised, and the anterior cul-de-sac was entered sharply without difficulty and a retractor was placed.  The same procedure was performed posteriorly and the posterior cul-de-sac was entered sharply without difficulty.  A long weighted speculum was inserted  into the posterior cul-de-sac.  The Heaney clamp was then used to clamp the uterosacral ligaments  on either side.  They were then cut and sutured ligated with 0 Vicryl, and were held with a tag for later identification. Of note, all sutures used in this case were 0 Vicryl unless otherwise noted.   The cardinal ligaments were then clamped, cut and ligated bilaterally. The uterus was noted to be freed from all ligaments and was then delivered and sent to pathology.  Excellent hemostasis was noted at this point. The uterus was noted to be freed from all ligaments and was then delivered and sent to pathology.   After completion of the hysterectomy, all pedicles from the uterosacral ligament to the cornua were examined hemostasis was confirmed.  The uterosacral pedicles were then affixed to the ipsilateral vaginal cuff angles using 0 Vicryl sutures.  The vaginal cuff was then closed in a running locked fashion with 0 Vicryl.  All instruments were then removed from the pelvis.   Attention was redirected to the abdomin where the port sites were repaired with 4-0 vicryl and all of the sites were injected with 0.5% Marcaine- total amount 20cc.  Dermabond used as a dressing. The patient tolerated the procedures well.  All instruments, needles, and sponge counts were correct x 2. The patient was taken to the recovery room awake, extubated and in stable condition.

## 2012-10-08 NOTE — Transfer of Care (Signed)
Immediate Anesthesia Transfer of Care Note  Patient: Tiffany Whitney  Procedure(s) Performed: Procedure(s): LAPAROSCOPIC ASSISTED VAGINAL HYSTERECTOMY (N/A) BILATERAL SALPINGECTOMY (Bilateral)  Patient Location: PACU  Anesthesia Type:General  Level of Consciousness: awake, alert  and oriented  Airway & Oxygen Therapy: Patient Spontanous Breathing and Patient connected to nasal cannula oxygen  Post-op Assessment: Report given to PACU RN and Post -op Vital signs reviewed and stable  Post vital signs: Reviewed and stable  Complications: No apparent anesthesia complications

## 2012-10-08 NOTE — Anesthesia Preprocedure Evaluation (Signed)
Anesthesia Evaluation  Patient identified by MRN, date of birth, ID band Patient awake    Reviewed: Allergy & Precautions, H&P , Patient's Chart, lab work & pertinent test results, reviewed documented beta blocker date and time   Airway Mallampati: IV TM Distance: >3 FB Neck ROM: full  Mouth opening: Limited Mouth Opening  Dental no notable dental hx. (+) Poor Dentition   Pulmonary  breath sounds clear to auscultation  Pulmonary exam normal       Cardiovascular Rhythm:regular Rate:Normal     Neuro/Psych    GI/Hepatic   Endo/Other  Morbid obesity  Renal/GU      Musculoskeletal   Abdominal   Peds  Hematology   Anesthesia Other Findings   Reproductive/Obstetrics                           Anesthesia Physical Anesthesia Plan  ASA: III  Anesthesia Plan: General   Post-op Pain Management:    Induction: Intravenous  Airway Management Planned: Oral ETT and Video Laryngoscope Planned  Additional Equipment:   Intra-op Plan:   Post-operative Plan: Extubation in OR  Informed Consent: I have reviewed the patients History and Physical, chart, labs and discussed the procedure including the risks, benefits and alternatives for the proposed anesthesia with the patient or authorized representative who has indicated his/her understanding and acceptance.   Dental Advisory Given and Dental advisory given  Plan Discussed with: CRNA and Surgeon  Anesthesia Plan Comments: (  Discussed general anesthesia, including possible nausea, instrumentation of airway, sore throat,pulmonary aspiration, etc. I asked if the were any outstanding questions, or  concerns before we proceeded. )        Anesthesia Quick Evaluation

## 2012-10-09 ENCOUNTER — Encounter (HOSPITAL_COMMUNITY): Payer: Self-pay | Admitting: Obstetrics & Gynecology

## 2012-10-16 ENCOUNTER — Other Ambulatory Visit: Payer: Self-pay | Admitting: Obstetrics & Gynecology

## 2012-10-21 ENCOUNTER — Ambulatory Visit (INDEPENDENT_AMBULATORY_CARE_PROVIDER_SITE_OTHER): Payer: Medicaid Other | Admitting: Obstetrics & Gynecology

## 2012-10-21 ENCOUNTER — Encounter: Payer: Self-pay | Admitting: Obstetrics & Gynecology

## 2012-10-21 VITALS — BP 129/90 | HR 101 | Temp 97.8°F | Ht 61.0 in | Wt 184.4 lb

## 2012-10-21 DIAGNOSIS — Z9889 Other specified postprocedural states: Secondary | ICD-10-CM

## 2012-10-21 DIAGNOSIS — Z09 Encounter for follow-up examination after completed treatment for conditions other than malignant neoplasm: Secondary | ICD-10-CM

## 2012-10-21 NOTE — Progress Notes (Signed)
Subjective:     Patient ID: Tiffany Whitney, female   DOB: [birth year], 32 y.o.   MRN: [id number]  HPI  Pt still c/o pain and soreness with movement.  She is taking Percocet as she is having increased pain due to Fibromyalgia.  Pt tolerating diet without difficulty.  No N/V.  tolerating meals without difficulty.    Review of Systems     Objective:   Physical Exam BP 129/90  Pulse 101  Temp(Src) 97.8 F (36.6 C) (Oral)  Ht 5\' 1"  (1.549 m)  Wt 184 lb 6.4 oz (83.643 kg)  BMI 34.86 kg/m2  LMP 10/02/2012 Pt in NAD Abd: soft, NT, ND Ports sites well healed    Diagnosis 10/08/2012 Uterus and bilateral fallopian tubes, with cervix - CERVIX: MILD CHRONIC INFLAMMATION. - ENDOMETRIUM: PROLIFERATIVE TYPE ENDOMETRIUM. - MYOMETRIUM: UNREMARKABLE. - SEROSA: ADENOMYOSIS. - BILATERAL FALLOPIAN TUBES: UNREMARKABLE.     Assessment:     Post op check- doing well      Plan:     F/u in 4 weeks rec Fennel seed cap 2 po with meals

## 2012-10-21 NOTE — Patient Instructions (Addendum)
Hysterectomy °Care After °Refer to this sheet in the next few weeks. These instructions provide you with information on caring for yourself after your procedure. Your caregiver may also give you more specific instructions. Your treatment has been planned according to current medical practices, but problems sometimes occur. Call your caregiver if you have any problems or questions after your procedure. °HOME CARE INSTRUCTIONS  °Healing will take time. You may have discomfort, tenderness, swelling, and bruising at the surgical site for about 2 weeks. This is normal and will get better as time goes on. °· Only take over-the-counter or prescription medicines for pain, discomfort, or fever as directed by your caregiver. °· Do not take aspirin. It can cause bleeding. °· Do not drive when taking pain medicine. °· Follow your caregiver's advice regarding exercise, lifting, driving, and general activities. °· Resume your usual diet as directed and allowed. °· Get plenty of rest and sleep. °· Do not douche, use tampons, or have sexual intercourse for at least 6 weeks or until your caregiver gives you permission. °· Change your bandages (dressings) as directed by your caregiver. °· Monitor your temperature. °· Take showers instead of baths for 2 to 3 weeks. °· Do not drink alcohol until your caregiver gives you permission. °· If you are constipated, you may take a mild laxative with your caregiver's permission. Bran foods may help with constipation problems. Drinking enough fluids to keep your urine clear or pale yellow may help as well. °· Try to have someone home with you for 1 or 2 weeks to help around the house. °· Keep all of your follow-up appointments as directed by your caregiver. °SEEK MEDICAL CARE IF:  °· You have swelling, redness, or increasing pain in the surgical cut (incision) area. °· You have pus coming from the incision. °· You notice a bad smell coming from the incision or dressing. °· You have swelling,  redness, or pain around the intravenous (IV) site. °· Your incision breaks open. °· You feel dizzy or lightheaded. °· You have pain or bleeding when you urinate. °· You have persistent diarrhea. °· You have persistent nausea and vomiting. °· You have abnormal vaginal discharge. °· You have a rash. °· You have any type of abnormal reaction or develop an allergy to your medicine. °· Your pain is not controlled with your prescribed medicine. °SEEK IMMEDIATE MEDICAL CARE IF:  °· You have a fever. °· You have severe abdominal pain. °· You have chest pain. °· You have shortness of breath. °· You faint. °· You have pain, swelling, or redness of your leg. °· You have heavy vaginal bleeding with blood clots. °MAKE SURE YOU: °· Understand these instructions. °· Will watch your condition. °· Will get help right away if you are not doing well or get worse. °Document Released: 09/09/2004 Document Revised: 05/15/2011 Document Reviewed: 10/07/2010 °[employer]® Patient Information ©2014 [employer]. ° °

## 2012-10-29 ENCOUNTER — Inpatient Hospital Stay (HOSPITAL_COMMUNITY)
Admission: AD | Admit: 2012-10-29 | Discharge: 2012-10-30 | Disposition: A | Discharge status: Home / Self Care | Payer: Medicaid Other | Source: Ambulatory Visit | Attending: Obstetrics & Gynecology | Admitting: Obstetrics & Gynecology

## 2012-10-29 DIAGNOSIS — I82890 Acute embolism and thrombosis of other specified veins: Secondary | ICD-10-CM | POA: Insufficient documentation

## 2012-10-29 DIAGNOSIS — Z90710 Acquired absence of both cervix and uterus: Secondary | ICD-10-CM | POA: Insufficient documentation

## 2012-10-29 DIAGNOSIS — R109 Unspecified abdominal pain: Secondary | ICD-10-CM | POA: Insufficient documentation

## 2012-10-29 LAB — URINE MICROSCOPIC-ADD ON

## 2012-10-29 LAB — CBC WITH DIFFERENTIAL/PLATELET
Basophils Relative: 1 % (ref 0–1)
HCT: 39.6 % (ref 36.0–46.0)
Hemoglobin: 13.9 g/dL (ref 12.0–15.0)
Lymphocytes Relative: 26 % (ref 12–46)
Lymphs Abs: 3.5 10*3/uL (ref 0.7–4.0)
MCHC: 35.1 g/dL (ref 30.0–36.0)
Monocytes Absolute: 0.9 10*3/uL (ref 0.1–1.0)
Monocytes Relative: 6 % (ref 3–12)
Neutro Abs: 8.4 10*3/uL — ABNORMAL HIGH (ref 1.7–7.7)
Neutrophils Relative %: 62 % (ref 43–77)
RBC: 4.44 MIL/uL (ref 3.87–5.11)

## 2012-10-29 LAB — WET PREP, GENITAL
Clue Cells Wet Prep HPF POC: NONE SEEN
Trich, Wet Prep: NONE SEEN

## 2012-10-29 LAB — URINALYSIS, ROUTINE W REFLEX MICROSCOPIC
Bilirubin Urine: NEGATIVE
Glucose, UA: NEGATIVE mg/dL
Ketones, ur: NEGATIVE mg/dL
Specific Gravity, Urine: <1.005 — ABNORMAL LOW (ref 1.005–1.030)
pH: 6 (ref 5.0–8.0)

## 2012-10-29 MED ORDER — OXYCODONE-ACETAMINOPHEN 5-325 MG PO TABS
2.0000 | ORAL_TABLET | Freq: Once | ORAL | Status: AC
  Administered 2012-10-29: 2 via ORAL
  Filled 2012-10-29: qty 2

## 2012-10-29 MED ORDER — HYDROMORPHONE HCL PF 2 MG/ML IJ SOLN
2.0000 mg | Freq: Once | INTRAMUSCULAR | Status: AC
  Administered 2012-10-29: 2 mg via INTRAMUSCULAR
  Filled 2012-10-29: qty 1

## 2012-10-29 NOTE — MAU Note (Addendum)
PT SAYS SHE HAD HYSTERECTOMY-  UTERUS AND TUBES-  Y ON 8-5-  BY DR Marylen Ponto- [facility]   HERE.    ALL FINE UNTIL  5 PM TONIGHT -  SHE STARTED HAVING SEVER CRAMPS  WITH STABBING PAIN ABOVE GROINS.   .  NO BLEEDING-  SINCE 3-4 DAYS AFTER SURGERY.      NO FEVER.  IS EATING/ DRINKING/ HAD BM OK.  NO BURNING WITH VOIDING... SHE CALLED MAU  AT 5 PM - TOLD TO COME IN.   SHE DID NOT TAKE ANYTHING FOR PAIN.

## 2012-10-29 NOTE — MAU Provider Note (Signed)
Chief Complaint: Abdominal Pain   First Provider Initiated Contact with Patient 10/29/12 2131     SUBJECTIVE HPI: Tiffany Whitney is a 32 y.o. [id number] female 3 weeks S/P LAVH bilat salpingectomy who presents with severe, sharp mid, low abd pain since 1700. Has has uncomplicated recovery prior to this episode. Denies intercourse since surgery.  Past Medical History  Diagnosis Date  . Migraine   . Depression   . Bipolar 1 disorder   . Otitis media   . Strep throat   . Fibromyalgia 04/2012  . Restless leg syndrome   . Menorrhagia   . Anxiety   . Asthma     as a child- no inhaler   OB History  Gravida Para Term Preterm AB SAB TAB Ectopic Multiple Living  6 3 2 1 3 3    3     # Outcome Date GA Lbr Len/2nd Weight Sex Delivery Anes PTL Lv  6 PRE      SVD     5 SAB           4 SAB           3 SAB           2 TRM      SVD     1 TRM      SVD        Past Surgical History  Procedure Laterality Date  . Tonsillectomy    . Cholecystectomy    . Tubal ligation    . Tympanostomy    . Laparoscopic assisted vaginal hysterectomy N/A 10/08/2012    Procedure: LAPAROSCOPIC ASSISTED VAGINAL HYSTERECTOMY;  Surgeon: Willodean Rosenthal, MD;  Location: [facility];  Service: Gynecology;  Laterality: N/A;  . Bilateral salpingectomy Bilateral 10/08/2012    Procedure: BILATERAL SALPINGECTOMY;  Surgeon: Willodean Rosenthal, MD;  Location: [facility];  Service: Gynecology;  Laterality: Bilateral;   History   Social History  . Marital Status: Married    Spouse Name: N/A    Number of Children: N/A  . Years of Education: N/A   Occupational History  . Not on file.   Social History Main Topics  . Smoking status: Current Every Day Smoker -- 1.00 packs/day for 15 years    Types: Cigarettes  . Smokeless tobacco: Never Used  . Alcohol Use: No  . Drug Use: No  . Sexual Activity: Yes    Birth Control/ Protection: Surgical   Other Topics Concern  . Not on file   Social History Narrative  . No narrative  on file   No current facility-administered medications on file prior to encounter.   Current Outpatient Prescriptions on File Prior to Encounter  Medication Sig Dispense Refill  . Aspirin-Acetaminophen-Caffeine (GOODY HEADACHE PO) Take 1 packet by mouth daily as needed (aches/pain).      . busPIRone (BUSPAR) 5 MG tablet Take 5 mg by mouth 2 (two) times daily.      Marland Kitchen desvenlafaxine (PRISTIQ) 50 MG 24 hr tablet Take 50 mg by mouth daily.      Marland Kitchen oxyCODONE-acetaminophen (PERCOCET/ROXICET) 5-325 MG per tablet Take 1-2 tablets by mouth every 6 (six) hours as needed for pain.  30 tablet  0  . tiZANidine (ZANAFLEX) 4 MG tablet Take 4 mg by mouth every 6 (six) hours as needed (muscle spasms).      . topiramate (TOPAMAX) 25 MG tablet Take 75 mg by mouth 2 (two) times daily.        Allergies  Allergen Reactions  . Morphine  And Related Hives  . Reglan [Metoclopramide]     jittery    ROS: Pos for abd pain, nausea. Neg for fever, chills, VB, vomiting, diarrhea, constipation, urinary complaints. Last BM 10/29/2012. Daily, soft BMs.   OBJECTIVE Blood pressure 119/85, pulse 78, temperature 98.5 F (36.9 C), temperature source Oral, resp. rate 18, height 5\' 1"  (1.549 m), weight 82.555 kg (182 lb), last menstrual period 10/02/2012, SpO2 100.00%. GENERAL: Well-developed, well-nourished female in mild to moderate distress.  HEENT: Normocephalic HEART: normal rate RESP: normal effort ABDOMEN: Soft, severe suprapubic and left lower quadrant tenderness. Positive bowel sounds. Trocar sites at the umbilicus and right and left lower quadrant healing well. No erythema or drainage. EXTREMITIES: Nontender, no edema NEURO: Alert and oriented SPECULUM EXAM: NEFG, small amount of thin, yellow-tinged mildly malodorous discharge, no blood noted, cervix surgically absent. Sutures in place. Vaginal cuff intact. No erythema. BIMANUAL: cervix absent; uterus absent, severe tenderness at cuff and left adnexum. Exam limited  by patient intolerance of exam. No obvious mass palpated. No right adnexal tenderness.  LAB RESULTS Results for orders placed during the hospital encounter of 10/29/12 (from the past 24 hour(s))  URINALYSIS, ROUTINE W REFLEX MICROSCOPIC     Status: Abnormal   Collection Time    10/29/12  7:31 PM      Result Value Range   Color, Urine YELLOW  YELLOW   APPearance CLEAR  CLEAR   Specific Gravity, Urine <1.005 (*) 1.005 - 1.030   pH 6.0  5.0 - 8.0   Glucose, UA NEGATIVE  NEGATIVE mg/dL   Hgb urine dipstick NEGATIVE  NEGATIVE   Bilirubin Urine NEGATIVE  NEGATIVE   Ketones, ur NEGATIVE  NEGATIVE mg/dL   Protein, ur NEGATIVE  NEGATIVE mg/dL   Urobilinogen, UA 0.2  0.0 - 1.0 mg/dL   Nitrite NEGATIVE  NEGATIVE   Leukocytes, UA SMALL (*) NEGATIVE  URINE MICROSCOPIC-ADD ON     Status: Abnormal   Collection Time    10/29/12  7:31 PM      Result Value Range   Squamous Epithelial / LPF FEW (*) RARE   WBC, UA 7-10  <3 WBC/hpf   Bacteria, UA FEW (*) RARE   Urine-Other MUCOUS PRESENT    POCT PREGNANCY, URINE     Status: None   Collection Time    10/29/12  7:54 PM      Result Value Range   Preg Test, Ur NEGATIVE  NEGATIVE  CBC WITH DIFFERENTIAL     Status: Abnormal   Collection Time    10/29/12  9:28 PM      Result Value Range   WBC 13.5 (*) 4.0 - 10.5 K/uL   RBC 4.44  3.87 - 5.11 MIL/uL   Hemoglobin 13.9  12.0 - 15.0 g/dL   HCT 21.3  08.6 - 57.8 %   MCV 89.2  78.0 - 100.0 fL   MCH 31.3  26.0 - 34.0 pg   MCHC 35.1  30.0 - 36.0 g/dL   RDW 46.9  62.9 - 52.8 %   Platelets 322  150 - 400 K/uL   Neutrophils Relative % 62  43 - 77 %   Neutro Abs 8.4 (*) 1.7 - 7.7 K/uL   Lymphocytes Relative 26  12 - 46 %   Lymphs Abs 3.5  0.7 - 4.0 K/uL   Monocytes Relative 6  3 - 12 %   Monocytes Absolute 0.9  0.1 - 1.0 K/uL   Eosinophils Relative 5  0 - 5 %  Eosinophils Absolute 0.6  0.0 - 0.7 K/uL   Basophils Relative 1  0 - 1 %   Basophils Absolute 0.1  0.0 - 0.1 K/uL  WET PREP, GENITAL      Status: Abnormal   Collection Time    10/29/12 11:10 PM      Result Value Range   Yeast Wet Prep HPF POC NONE SEEN  NONE SEEN   Trich, Wet Prep NONE SEEN  NONE SEEN   Clue Cells Wet Prep HPF POC NONE SEEN  NONE SEEN   WBC, Wet Prep HPF POC TOO NUMEROUS TO COUNT (*) NONE SEEN    IMAGING US Pelvis Complete/transvaginal  10/30/2012   *RADIOLOGY REPORT*  Clinical Data: Hysterectomy and bilateral salpingectomy (10/08/2012), now with lower abdominal pain.  TRANSABDOMINAL AND TRANSVAGINAL ULTRASOUND OF PELVIS Technique:  Both transabdominal and transvaginal ultrasound examinations of the pelvis were performed. Transabdominal technique was performed for global imaging of the pelvis including uterus, ovaries, adnexal regions, and pelvic cul-de-sac.  It was necessary to proceed with endovaginal exam following the transabdominal exam to visualize the bilateral ovaries.  Comparison:  Pelvic ultrasound - 12/18/2011; CT of the pelvis - 02/06/2012  Findings:  Uterus: Surgically absent  Right ovary:  The right ovary is borderline enlarged in size measuring 4.5 x 2.6 x 3.2 cm.  There are several peripheral minimally complex presumably physiologic cysts within the periphery of the right ovary, thelargest of which measures approximately 1.4 cm in diameter (image 41). No right-side adnexal mass.  Left ovary: Normal in size measuring 2.5 x 1.2 x 1.4 cm.  No discrete left-sided ovarian or adnexal mass.  Other findings: Trace amount of free fluid in the pelvic cul-de- sac, presumably physiologic.  IMPRESSION:  1.   No explanation for patient's persistent lower abdominal pain post recent hysterectomy. 2.  Several peripheral, minimally complex presumably physiologic cysts/follicles are identified within an otherwise normal-appearing right ovary, the largest of which measures approximately 1.4 cm in diameter.  This is almost certainly benign, and no specific imaging follow up is recommended according to the Society of  Radiologists in Ultrasound 2010 Consensus  Conference Statement (D Lenis Noon et al. Management of Asymptomatic Ovarian and Other Adnexal Cysts Imaged at [place]:  Society of Radiologists in Ultrasound Consensus Conference Statement 2010.  Radiology 256 (Sept 2010): 943-954.).   Original Report Authenticated By: Tacey Ruiz, MD   Ct Abdomen Pelvis W Contrast  10/30/2012   *RADIOLOGY REPORT*  Clinical Data: Abdominal pain  CT ABDOMEN AND PELVIS WITH CONTRAST  Technique:  Multidetector CT imaging of the abdomen and pelvis was performed following the standard protocol during bolus administration of intravenous contrast.  Contrast: OMNIPAQUE IOHEXOL 300 MG/ML  SOLN  Comparison: 10/30/2012 ultrasound, 12/07/2011 CT  Findings: Mild middle lobe, lingula and left greater than right lower lobe linear opacities, favor atelectasis.  Normal heart size.  Hepatic steatosis.  Cholecystectomy.  No biliary ductal dilatation. Unremarkable spleen, pancreas, adrenal glands, kidneys.  No hydroureteronephrosis.  No CT evidence for colitis. Submucosal fatty infiltration of the cm the sequelae of prior inflammation.  Normal appendix.  No free intraperitoneal air.  No lymphadenopathy. Tiny fat containing umbilical hernia.  Normal caliber aorta and [place] vessels. While the timing of the examination is not optimized to evaluate the veins, asymmetric enlargement and central hypoattenuation suggest nonocclusive left ovarian vein thrombosis (coronal image 48 as index).  Decompressed bladder.  Absent uterus.  Right adnexal tubal ligation clip.  Probable corpus luteal cyst on the right and  trace free fluid within the pelvis.  No acute osseous finding. Locule of gas within the right posterior subcutaneous tissues, presumably an injection site.  IMPRESSION: Status post hysterectomy.  Right corpus luteal cyst and trace free pelvic fluid.  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   Original Report Authenticated By: Jearld Lesch, M.D.     MAU COURSE 2150: 2 Percocet given.  2330: Pain improved after Percocet, but patient and severe distress with pelvic exam. Dilaudid ordered. To ultrasound.  0140: Discussed patient symptoms, exam, ultrasound, mild leukocytosis with Dr. Penne Lash. Pelvic CT ordered.  0410: Patient requesting more pain medication. Very uncomfortable appearing. Dilaudid IV ordered. Still awaiting CT.  0700: Reviewed CT findings and pt report of continued pain and nausea w/ Dr Penne Lash. Will consult w/ Dr. Erin Fulling (pt's surgeon) RE: POC. Dilaudid ordered.   0830: Feeling better w/ Dilaudid and phenergan. Dr. Penne Lash still consulting RE: POC. Explained Dx to pt. Care of pt turned over to Jannifer Rodney, NP.   [place], [place] 10/30/2012 8:39 AM  ASSESSMENT 1. Thrombosis of pelvic vein    PLAN After discussion with Vascular surgery, decision made to anti-coagulate with Xarelto.  Per pharmacy, dosing is 15 mg bid x 3 wks with food, then 20 mg daily x 3 months. Instructions given to pt.  She will f/u with primary surgeon as soon as possible.  Continue pain meds per pain management.

## 2012-10-30 ENCOUNTER — Inpatient Hospital Stay (HOSPITAL_COMMUNITY): Payer: Medicaid Other

## 2012-10-30 ENCOUNTER — Encounter (HOSPITAL_COMMUNITY): Payer: Self-pay | Admitting: Radiology

## 2012-10-30 MED ORDER — ONDANSETRON 8 MG PO TBDP
8.0000 mg | ORAL_TABLET | Freq: Once | ORAL | Status: AC
  Administered 2012-10-30: 8 mg via ORAL
  Filled 2012-10-30: qty 1

## 2012-10-30 MED ORDER — HYDROMORPHONE HCL PF 2 MG/ML IJ SOLN
2.0000 mg | Freq: Once | INTRAMUSCULAR | Status: AC
  Administered 2012-10-30: 2 mg via INTRAVENOUS
  Filled 2012-10-30: qty 1

## 2012-10-30 MED ORDER — PROMETHAZINE HCL 25 MG PO TABS
25.0000 mg | ORAL_TABLET | Freq: Four times a day (QID) | ORAL | Status: DC | PRN
  Administered 2012-10-30: 25 mg via ORAL
  Filled 2012-10-30: qty 1

## 2012-10-30 MED ORDER — HYDROMORPHONE HCL PF 2 MG/ML IJ SOLN: 2.0000 mg | Freq: Once | INTRAMUSCULAR | Status: DC

## 2012-10-30 MED ORDER — HYDROMORPHONE HCL PF 1 MG/ML IJ SOLN
INTRAMUSCULAR | Status: AC
  Administered 2012-10-30: 2 mg
  Filled 2012-10-30: qty 2

## 2012-10-30 MED ORDER — RIVAROXABAN 15 MG PO TABS: 15.0000 mg | ORAL_TABLET | Freq: Two times a day (BID) | ORAL | Status: DC

## 2012-10-30 MED ORDER — RIVAROXABAN 20 MG PO TABS: 20.0000 mg | ORAL_TABLET | Freq: Every day | ORAL | Status: DC

## 2012-10-30 MED ORDER — IOHEXOL 300 MG/ML  SOLN
100.0000 mL | Freq: Once | INTRAMUSCULAR | Status: AC | PRN
Start: 2012-10-30 — End: 2012-10-30
  Administered 2012-10-30: 100 mL via INTRAVENOUS

## 2012-10-30 MED ORDER — IOHEXOL 300 MG/ML  SOLN
50.0000 mL | INTRAMUSCULAR | Status: AC
  Administered 2012-10-30: 50 mL via ORAL

## 2012-10-30 MED ORDER — LACTATED RINGERS IV SOLN
INTRAVENOUS | Status: DC
  Administered 2012-10-30: 04:00:00 via INTRAVENOUS

## 2012-10-30 NOTE — MAU Note (Signed)
Pt returned from CT °

## 2012-10-31 ENCOUNTER — Telehealth: Payer: Self-pay

## 2012-10-31 LAB — URINE CULTURE: Colony Count: 45000

## 2012-10-31 NOTE — Telephone Encounter (Signed)
Pt called and stated that she has questions about what they told her in MAU. Called pt and pt asked how long will she be in pain, should she be concerned, and about the medication. I advised pt that there is no way to know exactly when the pain will go away.   Pt asked will clot travel.  I explained to the pt that yes it is a possiblity that the clot will travel but the medication that was prescribed will dissolve the clot so she should not have that worry.  I explained to the pt that the purpose of the Xarelto and to take as prescribed.  Pt stated understanding and did not have any other questions.

## 2012-11-05 ENCOUNTER — Encounter: Payer: Self-pay | Admitting: *Deleted

## 2012-11-28 ENCOUNTER — Encounter: Payer: Self-pay | Admitting: Obstetrics & Gynecology

## 2012-11-28 ENCOUNTER — Ambulatory Visit (INDEPENDENT_AMBULATORY_CARE_PROVIDER_SITE_OTHER): Payer: Medicaid Other | Admitting: Obstetrics & Gynecology

## 2012-11-28 VITALS — BP 117/89 | HR 94 | Temp 98.7°F | Ht 61.0 in | Wt 176.0 lb

## 2012-11-28 DIAGNOSIS — I82890 Acute embolism and thrombosis of other specified veins: Secondary | ICD-10-CM

## 2012-11-28 DIAGNOSIS — Z9889 Other specified postprocedural states: Secondary | ICD-10-CM

## 2012-11-28 DIAGNOSIS — T50904A Poisoning by unspecified drugs, medicaments and biological substances, undetermined, initial encounter: Secondary | ICD-10-CM

## 2012-11-28 DIAGNOSIS — Z09 Encounter for follow-up examination after completed treatment for conditions other than malignant neoplasm: Secondary | ICD-10-CM

## 2012-11-28 DIAGNOSIS — R197 Diarrhea, unspecified: Secondary | ICD-10-CM

## 2012-11-28 DIAGNOSIS — K521 Toxic gastroenteritis and colitis: Secondary | ICD-10-CM

## 2012-11-28 MED ORDER — LOPERAMIDE HCL 2 MG PO CAPS: 2.0000 mg | ORAL_CAPSULE | Freq: Four times a day (QID) | ORAL | Status: DC | PRN

## 2012-11-28 NOTE — Patient Instructions (Signed)
Hysterectomy °Care After °Refer to this sheet in the next few weeks. These instructions provide you with information on caring for yourself after your procedure. Your caregiver may also give you more specific instructions. Your treatment has been planned according to current medical practices, but problems sometimes occur. Call your caregiver if you have any problems or questions after your procedure. °HOME CARE INSTRUCTIONS  °Healing will take time. You may have discomfort, tenderness, swelling, and bruising at the surgical site for about 2 weeks. This is normal and will get better as time goes on. °· Only take over-the-counter or prescription medicines for pain, discomfort, or fever as directed by your caregiver. °· Do not take aspirin. It can cause bleeding. °· Do not drive when taking pain medicine. °· Follow your caregiver's advice regarding exercise, lifting, driving, and general activities. °· Resume your usual diet as directed and allowed. °· Get plenty of rest and sleep. °· Do not douche, use tampons, or have sexual intercourse for at least 6 weeks or until your caregiver gives you permission. °· Change your bandages (dressings) as directed by your caregiver. °· Monitor your temperature. °· Take showers instead of baths for 2 to 3 weeks. °· Do not drink alcohol until your caregiver gives you permission. °· If you are constipated, you may take a mild laxative with your caregiver's permission. Bran foods may help with constipation problems. Drinking enough fluids to keep your urine clear or pale yellow may help as well. °· Try to have someone home with you for 1 or 2 weeks to help around the house. °· Keep all of your follow-up appointments as directed by your caregiver. °SEEK MEDICAL CARE IF:  °· You have swelling, redness, or increasing pain in the surgical cut (incision) area. °· You have pus coming from the incision. °· You notice a bad smell coming from the incision or dressing. °· You have swelling,  redness, or pain around the intravenous (IV) site. °· Your incision breaks open. °· You feel dizzy or lightheaded. °· You have pain or bleeding when you urinate. °· You have persistent diarrhea. °· You have persistent nausea and vomiting. °· You have abnormal vaginal discharge. °· You have a rash. °· You have any type of abnormal reaction or develop an allergy to your medicine. °· Your pain is not controlled with your prescribed medicine. °SEEK IMMEDIATE MEDICAL CARE IF:  °· You have a fever. °· You have severe abdominal pain. °· You have chest pain. °· You have shortness of breath. °· You faint. °· You have pain, swelling, or redness of your leg. °· You have heavy vaginal bleeding with blood clots. °MAKE SURE YOU: °· Understand these instructions. °· Will watch your condition. °· Will get help right away if you are not doing well or get worse. °Document Released: 09/09/2004 Document Revised: 05/15/2011 Document Reviewed: 10/07/2010 °ExitCare® Patient Information ©2014 [employer]. ° °

## 2012-11-28 NOTE — Progress Notes (Signed)
Subjective:     Patient ID: Tiffany Whitney, female   DOB: 12/20/1980, 32 y.o.   MRN: [id number]  HPI Pt s/p LAVH with bilateral salpingectomy 10/2012. She was seen in the MAU and dx'd with an Ovarian vein thrombosis now on Xarelto.  She reports diarrhea since she was on Xarelto.  She reports >5 stools per day.     Review of Systems  Current Outpatient Prescriptions on File Prior to Visit  Medication Sig Dispense Refill  . Aspirin-Acetaminophen-Caffeine (GOODY HEADACHE PO) Take 1 packet by mouth daily as needed (aches/pain).      . busPIRone (BUSPAR) 5 MG tablet Take 5 mg by mouth 2 (two) times daily.      Marland Kitchen desvenlafaxine (PRISTIQ) 50 MG 24 hr tablet Take 50 mg by mouth daily.      Marland Kitchen gabapentin (NEURONTIN) 600 MG tablet Take 600 mg by mouth 3 (three) times daily.      . QUEtiapine (SEROQUEL) 25 MG tablet Take 25 mg by mouth at bedtime.      . Rivaroxaban (XARELTO) 20 MG TABS tablet Take 1 tablet (20 mg total) by mouth daily. Take for 3 months, after the 15 mg bid for 3 wks, take with food  30 tablet  2  . tiZANidine (ZANAFLEX) 4 MG tablet Take 4 mg by mouth every 6 (six) hours as needed (muscle spasms).      . topiramate (TOPAMAX) 25 MG tablet Take 75 mg by mouth 2 (two) times daily.       Marland Kitchen oxyCODONE-acetaminophen (PERCOCET/ROXICET) 5-325 MG per tablet Take 1-2 tablets by mouth every 6 (six) hours as needed for pain.  30 tablet  0   No current facility-administered medications on file prior to visit.       Objective:   Physical Exam BP 117/89  Pulse 94  Temp(Src) 98.7 F (37.1 C) (Oral)  Ht 5\' 1"  (1.549 m)  Wt 176 lb (79.833 kg)  BMI 33.27 kg/m2  LMP 10/02/2012 Abd: soft, NT, ND.  No rebound or guarding GU: EGBUS: no lesions Vagina: no blood in vault; loose sutures noted and removed. Adnexa: no masses; sl tender   2014 Clinical Data: Abdominal pain  CT ABDOMEN AND PELVIS WITH CONTRAST  Technique: Multidetector CT imaging of the abdomen and pelvis was  performed following the  standard protocol during bolus  administration of intravenous contrast.  Contrast: OMNIPAQUE IOHEXOL 300 MG/ML SOLN  Comparison: 10/30/2012 ultrasound, 12/07/2011 CT  Findings: Mild middle lobe, lingula and left greater than right  lower lobe linear opacities, favor atelectasis. Normal heart size.  Hepatic steatosis. Cholecystectomy. No biliary ductal dilatation.  Unremarkable spleen, pancreas, adrenal glands, kidneys. No  hydroureteronephrosis.  No CT evidence for colitis. Submucosal fatty infiltration of the cm  the sequelae of prior inflammation. Normal appendix. No free  intraperitoneal air. No lymphadenopathy. Tiny fat containing  umbilical hernia.  Normal caliber aorta and [place] vessels. While the timing of the  examination is not optimized to evaluate the veins, asymmetric  enlargement and central hypoattenuation suggest nonocclusive left  ovarian vein thrombosis (coronal image 48 as index).  Decompressed bladder. Absent uterus. Right adnexal tubal ligation  clip. Probable corpus luteal cyst on the right and trace free  fluid within the pelvis.  No acute osseous finding. Locule of gas within the right posterior  subcutaneous tissues, presumably an injection site.  IMPRESSION:  Status post hysterectomy.  Right corpus luteal cyst and trace free pelvic fluid.  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  Assessment:     Post op check.  Doing well.  S/p ovarian vein thrombosis. On Xarelto.        Plan:     [place] [facility] for 3 months  Loperamide 2mg  prn diarrhea as instructed. F/u 3 months or sooner prn Gradual return to full activities

## 2012-11-29 ENCOUNTER — Ambulatory Visit: Payer: Medicaid Other | Admitting: Obstetrics & Gynecology

## 2012-12-18 ENCOUNTER — Telehealth: Payer: Self-pay

## 2012-12-18 NOTE — Telephone Encounter (Signed)
Pt called and stated that she is on blood thinners that Dr. Erin Fulling put her on and she wants to know if she can get a letter for dental work.

## 2012-12-19 ENCOUNTER — Encounter: Payer: Self-pay | Admitting: *Deleted

## 2012-12-19 NOTE — Telephone Encounter (Signed)
Called Tiffany Whitney to inquire what dental work she was having done. She states she is  Having a filling done.  Discussed with Dr. Rulon Abide and instructed to have patient hold dose of xarelto that am of the procedure, then resume.  Informed [name] and she states she takes in pm- instructed her to hold dose the night before procedure.  Will Fax letter to her dentist at her request. Dr. Helen Hashimoto  Fax [phone], phone [phone]

## 2013-01-09 ENCOUNTER — Other Ambulatory Visit: Payer: Self-pay

## 2013-02-18 ENCOUNTER — Emergency Department (HOSPITAL_BASED_OUTPATIENT_CLINIC_OR_DEPARTMENT_OTHER)
Admission: EM | Admit: 2013-02-18 | Discharge: 2013-02-18 | Disposition: A | Discharge status: Home / Self Care | Payer: Medicaid Other | Attending: Emergency Medicine | Admitting: Emergency Medicine

## 2013-02-18 ENCOUNTER — Emergency Department (HOSPITAL_BASED_OUTPATIENT_CLINIC_OR_DEPARTMENT_OTHER): Payer: Medicaid Other

## 2013-02-18 ENCOUNTER — Encounter (HOSPITAL_BASED_OUTPATIENT_CLINIC_OR_DEPARTMENT_OTHER): Payer: Self-pay | Admitting: Emergency Medicine

## 2013-02-18 DIAGNOSIS — F3130 Bipolar disorder, current episode depressed, mild or moderate severity, unspecified: Secondary | ICD-10-CM | POA: Insufficient documentation

## 2013-02-18 DIAGNOSIS — G2581 Restless legs syndrome: Secondary | ICD-10-CM | POA: Insufficient documentation

## 2013-02-18 DIAGNOSIS — Z8619 Personal history of other infectious and parasitic diseases: Secondary | ICD-10-CM | POA: Insufficient documentation

## 2013-02-18 DIAGNOSIS — J45909 Unspecified asthma, uncomplicated: Secondary | ICD-10-CM | POA: Insufficient documentation

## 2013-02-18 DIAGNOSIS — G43909 Migraine, unspecified, not intractable, without status migrainosus: Secondary | ICD-10-CM | POA: Insufficient documentation

## 2013-02-18 DIAGNOSIS — F17200 Nicotine dependence, unspecified, uncomplicated: Secondary | ICD-10-CM | POA: Insufficient documentation

## 2013-02-18 DIAGNOSIS — M25551 Pain in right hip: Secondary | ICD-10-CM

## 2013-02-18 DIAGNOSIS — Z79899 Other long term (current) drug therapy: Secondary | ICD-10-CM | POA: Insufficient documentation

## 2013-02-18 DIAGNOSIS — F411 Generalized anxiety disorder: Secondary | ICD-10-CM | POA: Insufficient documentation

## 2013-02-18 DIAGNOSIS — M25559 Pain in unspecified hip: Secondary | ICD-10-CM | POA: Insufficient documentation

## 2013-02-18 DIAGNOSIS — Z8742 Personal history of other diseases of the female genital tract: Secondary | ICD-10-CM | POA: Insufficient documentation

## 2013-02-18 DIAGNOSIS — Z8669 Personal history of other diseases of the nervous system and sense organs: Secondary | ICD-10-CM | POA: Insufficient documentation

## 2013-02-18 MED ORDER — KETOROLAC TROMETHAMINE 60 MG/2ML IM SOLN
60.0000 mg | Freq: Once | INTRAMUSCULAR | Status: AC
  Administered 2013-02-18: 60 mg via INTRAMUSCULAR
  Filled 2013-02-18: qty 2

## 2013-02-18 MED ORDER — TRAMADOL HCL 50 MG PO TABS: 50.0000 mg | ORAL_TABLET | Freq: Four times a day (QID) | ORAL | Status: DC | PRN

## 2013-02-18 NOTE — ED Provider Notes (Signed)
CSN: [id number]     Arrival date & time 02/18/13  0831 History   First MD Initiated Contact with Patient 02/18/13 [phone]     Chief Complaint  Patient presents with  . Hip Pain   (Consider location/radiation/quality/duration/timing/severity/associated sxs/prior Treatment) HPI Comments: Patient with history of bipolar disorder and fibromyalgia. She presents today with complaints of pain in her right hip that started when she attempted to get out of bed. She states the pain is located in the front of her hip that is worsened with movement and palpation. She states that it hurts and she cannot lay flat on her back or on her hip. She denies any numbness or tingling in her leg or foot. She denies any weakness. She denies any back pain and denies any bowel or bladder complaints.  Patient is a 32 y.o. female presenting with hip pain. The history is provided by the patient.  Hip Pain This is a new problem. The current episode started less than 1 hour ago. The problem occurs constantly. The problem has not changed since onset.The symptoms are aggravated by walking. Nothing relieves the symptoms. She has tried nothing for the symptoms. The treatment provided no relief.    Past Medical History  Diagnosis Date  . Migraine   . Depression   . Bipolar 1 disorder   . Otitis media   . Strep throat   . Fibromyalgia 04/2012  . Restless leg syndrome   . Menorrhagia   . Anxiety   . Asthma     as a child- no inhaler   Past Surgical History  Procedure Laterality Date  . Tonsillectomy    . Cholecystectomy    . Tubal ligation    . Tympanostomy    . Laparoscopic assisted vaginal hysterectomy N/A 10/08/2012    Procedure: LAPAROSCOPIC ASSISTED VAGINAL HYSTERECTOMY;  Surgeon: Willodean Rosenthal, MD;  Location: [facility];  Service: Gynecology;  Laterality: N/A;  . Bilateral salpingectomy Bilateral 10/08/2012    Procedure: BILATERAL SALPINGECTOMY;  Surgeon: Willodean Rosenthal, MD;  Location: [facility];  Service:  Gynecology;  Laterality: Bilateral;   Family History  Problem Relation Age of Onset  . Depression Mother    History  Substance Use Topics  . Smoking status: Current Every Day Smoker -- 1.00 packs/day for 15 years    Types: Cigarettes  . Smokeless tobacco: Never Used  . Alcohol Use: No   OB History   Grav Para Term Preterm Abortions TAB SAB Ect Mult Living   6 3 2 1 3  3   3      Review of Systems  All other systems reviewed and are negative.    Allergies  Morphine and related and Reglan  Home Medications   Current Outpatient Rx  Name  Route  Sig  Dispense  Refill  . Aspirin-Acetaminophen-Caffeine (GOODY HEADACHE PO)   Oral   Take 1 packet by mouth daily as needed (aches/pain).         . busPIRone (BUSPAR) 5 MG tablet   Oral   Take 5 mg by mouth 2 (two) times daily.         Marland Kitchen desvenlafaxine (PRISTIQ) 50 MG 24 hr tablet   Oral   Take 50 mg by mouth daily.         Marland Kitchen gabapentin (NEURONTIN) 600 MG tablet   Oral   Take 600 mg by mouth 3 (three) times daily.         Marland Kitchen loperamide (IMODIUM) 2 MG capsule   Oral  Take 1 capsule (2 mg total) by mouth 4 (four) times daily as needed for diarrhea or loose stools.   30 capsule   0   . oxyCODONE-acetaminophen (PERCOCET/ROXICET) 5-325 MG per tablet   Oral   Take 1-2 tablets by mouth every 6 (six) hours as needed for pain.   30 tablet   0   . QUEtiapine (SEROQUEL) 25 MG tablet   Oral   Take 25 mg by mouth at bedtime.         . Rivaroxaban (XARELTO) 20 MG TABS tablet   Oral   Take 1 tablet (20 mg total) by mouth daily. Take for 3 months, after the 15 mg bid for 3 wks, take with food   30 tablet   2   . tiZANidine (ZANAFLEX) 4 MG tablet   Oral   Take 4 mg by mouth every 6 (six) hours as needed (muscle spasms).         . topiramate (TOPAMAX) 25 MG tablet   Oral   Take 75 mg by mouth 2 (two) times daily.           BP 111/66  Pulse 74  Temp(Src) 97.8 F (36.6 C) (Oral)  Resp 16  Ht 5\' 1"  (1.549  m)  Wt 168 lb (76.204 kg)  BMI 31.76 kg/m2  SpO2 99%  LMP 10/02/2012 Physical Exam  Nursing note and vitals reviewed. Constitutional: She is oriented to person, place, and time. She appears well-developed and well-nourished. No distress.  HENT:  Head: Normocephalic and atraumatic.  Neck: Normal range of motion. Neck supple.  Musculoskeletal: Normal range of motion.  The right hip appears grossly normal. There is tenderness to outpatient to the anterior, lateral, and posterior aspect. There is pain with range of motion. Distal dorsalis pedis and posterior tibial pulses are easily palpable. Sensation is intact in the foot and strength is 5 out of 5.  Neurological: She is alert and oriented to person, place, and time.  Skin: Skin is warm and dry. She is not diaphoretic.    ED Course  Procedures (including critical care time) Labs Review Labs Reviewed - No data to display Imaging Review No results found.    MDM  No diagnosis found. Patient is a 32 year old female with bipolar and fibromyalgia presents with hip pain that started when she moved her leg in bed this morning. Physical examination is unremarkable with the exception of subjective pain and x-rays are negative. She is given an injection of Toradol and will be discharged with tramadol and nonsteroidals. She is to return if her symptoms worsen or change.    Geoffery Lyons, MD 02/18/13 [phone]

## 2013-02-18 NOTE — ED Notes (Signed)
Right hip pain that started this am.  Denies injury

## 2013-02-18 NOTE — ED Notes (Signed)
Patient transported to X-ray 

## 2013-02-18 NOTE — ED Notes (Signed)
MD at bedside. 

## 2013-03-02 ENCOUNTER — Encounter (HOSPITAL_BASED_OUTPATIENT_CLINIC_OR_DEPARTMENT_OTHER): Payer: Self-pay | Admitting: Emergency Medicine

## 2013-03-02 ENCOUNTER — Emergency Department (HOSPITAL_BASED_OUTPATIENT_CLINIC_OR_DEPARTMENT_OTHER): Payer: Medicaid Other

## 2013-03-02 ENCOUNTER — Emergency Department (HOSPITAL_BASED_OUTPATIENT_CLINIC_OR_DEPARTMENT_OTHER)
Admission: EM | Admit: 2013-03-02 | Discharge: 2013-03-02 | Disposition: A | Discharge status: Home / Self Care | Payer: Medicaid Other | Attending: Emergency Medicine | Admitting: Emergency Medicine

## 2013-03-02 DIAGNOSIS — Z8619 Personal history of other infectious and parasitic diseases: Secondary | ICD-10-CM | POA: Insufficient documentation

## 2013-03-02 DIAGNOSIS — Z8739 Personal history of other diseases of the musculoskeletal system and connective tissue: Secondary | ICD-10-CM | POA: Insufficient documentation

## 2013-03-02 DIAGNOSIS — Z8742 Personal history of other diseases of the female genital tract: Secondary | ICD-10-CM | POA: Insufficient documentation

## 2013-03-02 DIAGNOSIS — X500XXA Overexertion from strenuous movement or load, initial encounter: Secondary | ICD-10-CM | POA: Insufficient documentation

## 2013-03-02 DIAGNOSIS — S335XXA Sprain of ligaments of lumbar spine, initial encounter: Secondary | ICD-10-CM | POA: Insufficient documentation

## 2013-03-02 DIAGNOSIS — Z8669 Personal history of other diseases of the nervous system and sense organs: Secondary | ICD-10-CM | POA: Insufficient documentation

## 2013-03-02 DIAGNOSIS — Y9389 Activity, other specified: Secondary | ICD-10-CM | POA: Insufficient documentation

## 2013-03-02 DIAGNOSIS — Z79899 Other long term (current) drug therapy: Secondary | ICD-10-CM | POA: Insufficient documentation

## 2013-03-02 DIAGNOSIS — F411 Generalized anxiety disorder: Secondary | ICD-10-CM | POA: Insufficient documentation

## 2013-03-02 DIAGNOSIS — G43909 Migraine, unspecified, not intractable, without status migrainosus: Secondary | ICD-10-CM | POA: Insufficient documentation

## 2013-03-02 DIAGNOSIS — T148XXA Other injury of unspecified body region, initial encounter: Secondary | ICD-10-CM

## 2013-03-02 DIAGNOSIS — Y929 Unspecified place or not applicable: Secondary | ICD-10-CM | POA: Insufficient documentation

## 2013-03-02 DIAGNOSIS — J45909 Unspecified asthma, uncomplicated: Secondary | ICD-10-CM | POA: Insufficient documentation

## 2013-03-02 DIAGNOSIS — F3130 Bipolar disorder, current episode depressed, mild or moderate severity, unspecified: Secondary | ICD-10-CM | POA: Insufficient documentation

## 2013-03-02 DIAGNOSIS — F17200 Nicotine dependence, unspecified, uncomplicated: Secondary | ICD-10-CM | POA: Insufficient documentation

## 2013-03-02 MED ORDER — MELOXICAM 7.5 MG PO TABS: 7.5000 mg | ORAL_TABLET | Freq: Every day | ORAL | Status: DC

## 2013-03-02 MED ORDER — KETOROLAC TROMETHAMINE 60 MG/2ML IM SOLN
60.0000 mg | Freq: Once | INTRAMUSCULAR | Status: AC
  Administered 2013-03-02: 60 mg via INTRAMUSCULAR
  Filled 2013-03-02: qty 2

## 2013-03-02 NOTE — ED Notes (Signed)
C/o sudden onset of low back pain while laying in bed approx one hour after turning over to put something on the floor.  Pain started when she rolled back over.  Is currently under pain management.  Referred here by the on call person for radiology studies.

## 2013-03-02 NOTE — ED Provider Notes (Signed)
CSN: [id number]     Arrival date & time 03/02/13  0007 History   First MD Initiated Contact with Patient 03/02/13 0422     Chief Complaint  Patient presents with  . Back Pain   (Consider location/radiation/quality/duration/timing/severity/associated sxs/prior Treatment) Patient is a 32 y.o. female presenting with back pain. The history is provided by the patient. No language interpreter was used.  Back Pain Location:  Lumbar spine Quality:  Aching Radiates to:  Does not radiate Pain severity:  Moderate Pain is:  Same all the time Onset quality:  Sudden Timing:  Constant Progression:  Unchanged Chronicity:  New Context comment:  Rolled over in bed.  Referred in for films by pain management Relieved by:  Nothing Worsened by:  Nothing tried Ineffective treatments:  None tried Associated symptoms: no abdominal pain, no abdominal swelling, no bladder incontinence, no bowel incontinence, no chest pain, no dysuria, no fever, no headaches, no leg pain, no numbness, no paresthesias, no pelvic pain, no perianal numbness, no tingling, no weakness and no weight loss   Risk factors: no hx of cancer     Past Medical History  Diagnosis Date  . Migraine   . Depression   . Bipolar 1 disorder   . Otitis media   . Strep throat   . Fibromyalgia 04/2012  . Restless leg syndrome   . Menorrhagia   . Anxiety   . Asthma     as a child- no inhaler   Past Surgical History  Procedure Laterality Date  . Tonsillectomy    . Cholecystectomy    . Tubal ligation    . Tympanostomy    . Laparoscopic assisted vaginal hysterectomy N/A 10/08/2012    Procedure: LAPAROSCOPIC ASSISTED VAGINAL HYSTERECTOMY;  Surgeon: Willodean Rosenthal, MD;  Location: [facility];  Service: Gynecology;  Laterality: N/A;  . Bilateral salpingectomy Bilateral 10/08/2012    Procedure: BILATERAL SALPINGECTOMY;  Surgeon: Willodean Rosenthal, MD;  Location: [facility];  Service: Gynecology;  Laterality: Bilateral;   Family History   Problem Relation Age of Onset  . Depression Mother    History  Substance Use Topics  . Smoking status: Current Every Day Smoker -- 1.00 packs/day for 15 years    Types: Cigarettes  . Smokeless tobacco: Never Used  . Alcohol Use: No   OB History   Grav Para Term Preterm Abortions TAB SAB Ect Mult Living   6 3 2 1 3  3   3      Review of Systems  Constitutional: Negative for fever and weight loss.  Cardiovascular: Negative for chest pain.  Gastrointestinal: Negative for abdominal pain and bowel incontinence.  Genitourinary: Negative for bladder incontinence, dysuria and pelvic pain.  Musculoskeletal: Positive for back pain.  Neurological: Negative for tingling, weakness, numbness, headaches and paresthesias.  All other systems reviewed and are negative.    Allergies  Morphine and related and Reglan  Home Medications   Current Outpatient Rx  Name  Route  Sig  Dispense  Refill  . Aspirin-Acetaminophen-Caffeine (GOODY HEADACHE PO)   Oral   Take 1 packet by mouth daily as needed (aches/pain).         . busPIRone (BUSPAR) 5 MG tablet   Oral   Take 5 mg by mouth 2 (two) times daily.         Marland Kitchen desvenlafaxine (PRISTIQ) 50 MG 24 hr tablet   Oral   Take 50 mg by mouth daily.         Marland Kitchen gabapentin (NEURONTIN) 600 MG  tablet   Oral   Take 600 mg by mouth 3 (three) times daily.         Marland Kitchen loperamide (IMODIUM) 2 MG capsule   Oral   Take 1 capsule (2 mg total) by mouth 4 (four) times daily as needed for diarrhea or loose stools.   30 capsule   0   . oxyCODONE-acetaminophen (PERCOCET/ROXICET) 5-325 MG per tablet   Oral   Take 1-2 tablets by mouth every 6 (six) hours as needed for pain.   30 tablet   0   . QUEtiapine (SEROQUEL) 25 MG tablet   Oral   Take 25 mg by mouth at bedtime.         . Rivaroxaban (XARELTO) 20 MG TABS tablet   Oral   Take 1 tablet (20 mg total) by mouth daily. Take for 3 months, after the 15 mg bid for 3 wks, take with food   30 tablet    2   . tiZANidine (ZANAFLEX) 4 MG tablet   Oral   Take 4 mg by mouth every 6 (six) hours as needed (muscle spasms).         . topiramate (TOPAMAX) 25 MG tablet   Oral   Take 75 mg by mouth 2 (two) times daily.          . traMADol (ULTRAM) 50 MG tablet   Oral   Take 1 tablet (50 mg total) by mouth every 6 (six) hours as needed.   15 tablet   0    BP 104/77  Temp(Src) 98.8 F (37.1 C) (Oral)  Resp 18  Ht 5\' 1"  (1.549 m)  Wt 165 lb (74.844 kg)  BMI 31.19 kg/m2  SpO2 100%  LMP 10/02/2012 Physical Exam  Constitutional: She is oriented to person, place, and time. She appears well-developed and well-nourished.  HENT:  Head: Normocephalic and atraumatic.  Eyes: Conjunctivae are normal. Pupils are equal, round, and reactive to light.  Neck: Normal range of motion. Neck supple.  Cardiovascular: Normal rate, regular rhythm and intact distal pulses.   Pulmonary/Chest: Effort normal and breath sounds normal. She has no wheezes. She has no rales.  Abdominal: Soft. Bowel sounds are normal. There is no tenderness. There is no rebound and no guarding.  Musculoskeletal: Normal range of motion. She exhibits no tenderness.  Neurological: She is alert and oriented to person, place, and time. She has normal reflexes.  Skin: Skin is warm and dry.    ED Course  Procedures (including critical care time) Labs Review Labs Reviewed - No data to display Imaging Review Dg Lumbar Spine Complete  03/02/2013   CLINICAL DATA:  Sudden onset of lower back pain.  EXAM: LUMBAR SPINE - COMPLETE 4+ VIEW  COMPARISON:  CT of the abdomen and pelvis performed 10/30/2012, and lumbar spine radiograph performed 05/27/2012  FINDINGS: There is no evidence of fracture or subluxation. Vertebral bodies demonstrate normal height and alignment. Intervertebral disc spaces are preserved. The visualized neural foramina are grossly unremarkable in appearance.  The visualized bowel gas pattern is unremarkable in appearance;  air and stool are noted within the colon. The sacroiliac joints are within normal limits. Clips are noted within the right upper quadrant, reflecting prior cholecystectomy. A right-sided tubal ligation clip is seen.  IMPRESSION: No evidence of fracture or subluxation along the lumbar spine.   Electronically Signed   By: Roanna Raider M.D.   On: 03/02/2013 03:26    EKG Interpretation   None  MDM  No diagnosis found. Muscle strain.  Patient has muscle relaxants at home and will add NSAIDS.  Follow up with your pain management specialist for ongoing care.    Jasmine Awe, MD 03/02/13 [phone]

## 2013-03-02 NOTE — ED Notes (Signed)
NO adverse effects noted to IM injection.  

## 2013-03-17 ENCOUNTER — Ambulatory Visit: Payer: Medicaid Other | Admitting: Obstetrics & Gynecology

## 2013-05-04 ENCOUNTER — Encounter (HOSPITAL_BASED_OUTPATIENT_CLINIC_OR_DEPARTMENT_OTHER): Payer: Self-pay | Admitting: Emergency Medicine

## 2013-05-04 ENCOUNTER — Emergency Department (HOSPITAL_BASED_OUTPATIENT_CLINIC_OR_DEPARTMENT_OTHER)
Admission: EM | Admit: 2013-05-04 | Discharge: 2013-05-04 | Disposition: A | Discharge status: Home / Self Care | Payer: Medicaid Other | Attending: Emergency Medicine | Admitting: Emergency Medicine

## 2013-05-04 DIAGNOSIS — Z7901 Long term (current) use of anticoagulants: Secondary | ICD-10-CM | POA: Insufficient documentation

## 2013-05-04 DIAGNOSIS — Z79899 Other long term (current) drug therapy: Secondary | ICD-10-CM | POA: Insufficient documentation

## 2013-05-04 DIAGNOSIS — Z791 Long term (current) use of non-steroidal anti-inflammatories (NSAID): Secondary | ICD-10-CM | POA: Insufficient documentation

## 2013-05-04 DIAGNOSIS — F17200 Nicotine dependence, unspecified, uncomplicated: Secondary | ICD-10-CM | POA: Insufficient documentation

## 2013-05-04 DIAGNOSIS — R509 Fever, unspecified: Secondary | ICD-10-CM | POA: Insufficient documentation

## 2013-05-04 DIAGNOSIS — Z8742 Personal history of other diseases of the female genital tract: Secondary | ICD-10-CM | POA: Insufficient documentation

## 2013-05-04 DIAGNOSIS — G43909 Migraine, unspecified, not intractable, without status migrainosus: Secondary | ICD-10-CM | POA: Insufficient documentation

## 2013-05-04 DIAGNOSIS — G2581 Restless legs syndrome: Secondary | ICD-10-CM | POA: Insufficient documentation

## 2013-05-04 DIAGNOSIS — J45909 Unspecified asthma, uncomplicated: Secondary | ICD-10-CM | POA: Insufficient documentation

## 2013-05-04 DIAGNOSIS — F319 Bipolar disorder, unspecified: Secondary | ICD-10-CM | POA: Insufficient documentation

## 2013-05-04 DIAGNOSIS — IMO0001 Reserved for inherently not codable concepts without codable children: Secondary | ICD-10-CM | POA: Insufficient documentation

## 2013-05-04 DIAGNOSIS — F411 Generalized anxiety disorder: Secondary | ICD-10-CM | POA: Insufficient documentation

## 2013-05-04 DIAGNOSIS — E162 Hypoglycemia, unspecified: Secondary | ICD-10-CM

## 2013-05-04 DIAGNOSIS — E1169 Type 2 diabetes mellitus with other specified complication: Secondary | ICD-10-CM | POA: Insufficient documentation

## 2013-05-04 DIAGNOSIS — Z8619 Personal history of other infectious and parasitic diseases: Secondary | ICD-10-CM | POA: Insufficient documentation

## 2013-05-04 LAB — CBG MONITORING, ED
Glucose-Capillary: 86 mg/dL (ref 70–99)
Glucose-Capillary: 82 mg/dL (ref 70–99)

## 2013-05-04 NOTE — ED Notes (Signed)
CBG 86 @ Triage

## 2013-05-04 NOTE — Discharge Instructions (Signed)
Eat food at regular intervals. Check blood sugar frequently and keep a log.   Follow up with your doctor.   Return to ER if you have vomiting, fever, trouble with vision, weakness.

## 2013-05-04 NOTE — ED Provider Notes (Signed)
CSN: [id number]     Arrival date & time 05/04/13  1844 History  This chart was scribed for Tiffany Canalavid H Yao, MD by Danella Maiersaroline Early, ED Scribe. This patient was seen in room MH08/MH08 and the patient's care was started at 9:04 PM.    Chief Complaint  Patient presents with  . Blood Sugar Problem   The history is provided by the patient. No language interpreter was used.   HPI Comments: Tiffany Whitney is a 33 y.o. female with a h/o DM2 who presents to the Emergency Department complaining of a blood sugar problem. Pt states her home glucose was 279 tonight after eating 4 barbeque wings and unsweetened tea. She called her PCP who told her to come here. She states she began feeling bad around 4PM tonight with headache and "cloudy" vision that has now resolved. She denies fevers although her temp in the ED is 99.9. Denies vomiting, cough, urinary symptoms. She states she is newly diagnosed diabetic. She started on metformin one week ago.     Past Medical History  Diagnosis Date  . Migraine   . Depression   . Bipolar 1 disorder   . Otitis media   . Strep throat   . Fibromyalgia 04/2012  . Restless leg syndrome   . Menorrhagia   . Anxiety   . Asthma     as a child- no inhaler   Past Surgical History  Procedure Laterality Date  . Tonsillectomy    . Cholecystectomy    . Tubal ligation    . Tympanostomy    . Laparoscopic assisted vaginal hysterectomy N/A 10/08/2012    Procedure: LAPAROSCOPIC ASSISTED VAGINAL HYSTERECTOMY;  Surgeon: Willodean Rosenthalarolyn Harraway-Smith, MD;  Location: [facility];  Service: Gynecology;  Laterality: N/A;  . Bilateral salpingectomy Bilateral 10/08/2012    Procedure: BILATERAL SALPINGECTOMY;  Surgeon: Willodean Rosenthalarolyn Harraway-Smith, MD;  Location: [facility];  Service: Gynecology;  Laterality: Bilateral;   Family History  Problem Relation Age of Onset  . Depression Mother    History  Substance Use Topics  . Smoking status: Current Every Day Smoker -- 1.00 packs/day for 15 years    Types: Cigarettes   . Smokeless tobacco: Never Used  . Alcohol Use: No   OB History   Grav Para Term Preterm Abortions TAB SAB Ect Mult Living   6 3 2 1 3  3   3      Review of Systems  Constitutional: Positive for fever.  Eyes: Positive for visual disturbance.  Neurological: Positive for headaches.  All other systems reviewed and are negative.      Allergies  Morphine and related and Reglan  Home Medications   Current Outpatient Rx  Name  Route  Sig  Dispense  Refill  . Aspirin-Acetaminophen-Caffeine (GOODY HEADACHE PO)   Oral   Take 1 packet by mouth daily as needed (aches/pain).         . busPIRone (BUSPAR) 5 MG tablet   Oral   Take 5 mg by mouth 2 (two) times daily.         Marland Kitchen. desvenlafaxine (PRISTIQ) 50 MG 24 hr tablet   Oral   Take 50 mg by mouth daily.         . hydrOXYzine (ATARAX/VISTARIL) 25 MG tablet   Oral   Take 50 mg by mouth at bedtime.         . metFORMIN (GLUCOPHAGE) 500 MG tablet   Oral   Take 250 mg by mouth daily after supper.         .Marland Kitchen  tiZANidine (ZANAFLEX) 4 MG tablet   Oral   Take 4 mg by mouth every 6 (six) hours as needed (muscle spasms).         . topiramate (TOPAMAX) 25 MG tablet   Oral   Take 75 mg by mouth 2 (two) times daily.          Marland Kitchen gabapentin (NEURONTIN) 600 MG tablet   Oral   Take 600 mg by mouth 3 (three) times daily.         Marland Kitchen loperamide (IMODIUM) 2 MG capsule   Oral   Take 1 capsule (2 mg total) by mouth 4 (four) times daily as needed for diarrhea or loose stools.   30 capsule   0   . meloxicam (MOBIC) 7.5 MG tablet   Oral   Take 1 tablet (7.5 mg total) by mouth daily.   7 tablet   0   . oxyCODONE-acetaminophen (PERCOCET/ROXICET) 5-325 MG per tablet   Oral   Take 1-2 tablets by mouth every 6 (six) hours as needed for pain.   30 tablet   0   . QUEtiapine (SEROQUEL) 25 MG tablet   Oral   Take 25 mg by mouth at bedtime.         . Rivaroxaban (XARELTO) 20 MG TABS tablet   Oral   Take 1 tablet (20 mg  total) by mouth daily. Take for 3 months, after the 15 mg bid for 3 wks, take with food   30 tablet   2   . traMADol (ULTRAM) 50 MG tablet   Oral   Take 1 tablet (50 mg total) by mouth every 6 (six) hours as needed.   15 tablet   0    BP 128/88  Pulse 86  Temp(Src) 99.9 F (37.7 C) (Oral)  Resp 20  Ht 5\' 1"  (1.549 m)  Wt 157 lb 8 oz (71.442 kg)  BMI 29.77 kg/m2  SpO2 99%  LMP 10/02/2012 Physical Exam  Nursing note and vitals reviewed. Constitutional: She is oriented to person, place, and time. She appears well-developed and well-nourished. No distress.  HENT:  Head: Normocephalic and atraumatic.  Eyes: EOM are normal.  Normal fundoscopic exam  Neck: Neck supple. No tracheal deviation present.  Cardiovascular: Normal rate.   Pulmonary/Chest: Effort normal. No respiratory distress.  Musculoskeletal: Normal range of motion.  Neurological: She is alert and oriented to person, place, and time.  Nl strength throughout   Skin: Skin is warm and dry.  Psychiatric: She has a normal mood and affect. Her behavior is normal.    ED Course  Procedures (including critical care time) Medications - No data to display  DIAGNOSTIC STUDIES: Oxygen Saturation is 99% on RA, normal by my interpretation.    COORDINATION OF CARE: 9:06 PM- Discussed treatment plan with pt. Pt agrees to plan.    Labs Review Labs Reviewed  CBG MONITORING, ED  CBG MONITORING, ED   Imaging Review No results found.   EKG Interpretation None      MDM   Final diagnoses:  None  Tiffany Whitney is a 33 y.o. female here with cloudy vision. NL fundoscopic exam, not having blurry vision or headaches. I think its likely symptoms from her blood sugar dropping from 279 to 86. Patient was in the ED for 2 hrs and CBG was stable. No signs of infection. I told her to eat food at home and keep a log of cbg.   I personally performed the services described in this  documentation, which was scribed in my presence.  The recorded information has been reviewed and is accurate.   Tiffany Canal, MD 05/04/13 2115

## 2013-05-04 NOTE — ED Notes (Addendum)
Pt reports home glucose of 279-states she called PCP and they advised that she come here. Pt reports she has DM II - on PO regimen. Pt states she blurred vision, headache and feels tired. States she began feeling bad around 1600. Pt is a newly diagnosed diabetic, [facility] event started.

## 2013-05-15 ENCOUNTER — Encounter (HOSPITAL_BASED_OUTPATIENT_CLINIC_OR_DEPARTMENT_OTHER): Payer: Self-pay | Admitting: Emergency Medicine

## 2013-05-15 DIAGNOSIS — Z79899 Other long term (current) drug therapy: Secondary | ICD-10-CM | POA: Insufficient documentation

## 2013-05-15 DIAGNOSIS — F411 Generalized anxiety disorder: Secondary | ICD-10-CM | POA: Insufficient documentation

## 2013-05-15 DIAGNOSIS — Z7901 Long term (current) use of anticoagulants: Secondary | ICD-10-CM | POA: Insufficient documentation

## 2013-05-15 DIAGNOSIS — J45909 Unspecified asthma, uncomplicated: Secondary | ICD-10-CM | POA: Insufficient documentation

## 2013-05-15 DIAGNOSIS — Z9851 Tubal ligation status: Secondary | ICD-10-CM | POA: Insufficient documentation

## 2013-05-15 DIAGNOSIS — Z8669 Personal history of other diseases of the nervous system and sense organs: Secondary | ICD-10-CM | POA: Insufficient documentation

## 2013-05-15 DIAGNOSIS — Z90710 Acquired absence of both cervix and uterus: Secondary | ICD-10-CM | POA: Insufficient documentation

## 2013-05-15 DIAGNOSIS — A088 Other specified intestinal infections: Secondary | ICD-10-CM | POA: Insufficient documentation

## 2013-05-15 DIAGNOSIS — E119 Type 2 diabetes mellitus without complications: Secondary | ICD-10-CM | POA: Insufficient documentation

## 2013-05-15 DIAGNOSIS — F319 Bipolar disorder, unspecified: Secondary | ICD-10-CM | POA: Insufficient documentation

## 2013-05-15 DIAGNOSIS — Z8742 Personal history of other diseases of the female genital tract: Secondary | ICD-10-CM | POA: Insufficient documentation

## 2013-05-15 DIAGNOSIS — G43909 Migraine, unspecified, not intractable, without status migrainosus: Secondary | ICD-10-CM | POA: Insufficient documentation

## 2013-05-15 DIAGNOSIS — F17200 Nicotine dependence, unspecified, uncomplicated: Secondary | ICD-10-CM | POA: Insufficient documentation

## 2013-05-15 LAB — URINALYSIS, ROUTINE W REFLEX MICROSCOPIC
Bilirubin Urine: NEGATIVE
GLUCOSE, UA: NEGATIVE mg/dL
HGB URINE DIPSTICK: NEGATIVE
Ketones, ur: NEGATIVE mg/dL
Leukocytes, UA: NEGATIVE
Nitrite: NEGATIVE
Protein, ur: NEGATIVE mg/dL
SPECIFIC GRAVITY, URINE: 1.016 (ref 1.005–1.030)
Urobilinogen, UA: 0.2 mg/dL (ref 0.0–1.0)
pH: 5.5 (ref 5.0–8.0)

## 2013-05-15 LAB — CBG MONITORING, ED: Glucose-Capillary: 87 mg/dL (ref 70–99)

## 2013-05-15 NOTE — ED Notes (Signed)
Vomiting and diarrhea x 2 days. Got a Rx for Zofran and Phenergan from her MD. Hx diabetes.

## 2013-05-16 ENCOUNTER — Emergency Department (HOSPITAL_BASED_OUTPATIENT_CLINIC_OR_DEPARTMENT_OTHER)
Admission: EM | Admit: 2013-05-16 | Discharge: 2013-05-16 | Disposition: A | Discharge status: Home / Self Care | Payer: Medicaid Other | Attending: Emergency Medicine | Admitting: Emergency Medicine

## 2013-05-16 DIAGNOSIS — A084 Viral intestinal infection, unspecified: Secondary | ICD-10-CM

## 2013-05-16 LAB — CBG MONITORING, ED
GLUCOSE-CAPILLARY: 76 mg/dL (ref 70–99)
GLUCOSE-CAPILLARY: 109 mg/dL — AB (ref 70–99)
GLUCOSE-CAPILLARY: 213 mg/dL — AB (ref 70–99)

## 2013-05-16 MED ORDER — DIPHENHYDRAMINE HCL 50 MG/ML IJ SOLN
25.0000 mg | Freq: Once | INTRAMUSCULAR | Status: AC
  Administered 2013-05-16: 25 mg via INTRAVENOUS

## 2013-05-16 MED ORDER — DEXTROSE 5 % AND 0.9 % NACL IV BOLUS
1000.0000 mL | Freq: Once | INTRAVENOUS | Status: AC
  Administered 2013-05-16: 1000 mL via INTRAVENOUS

## 2013-05-16 MED ORDER — SODIUM CHLORIDE 0.9 % IV SOLN
Freq: Once | INTRAVENOUS | Status: AC
  Administered 2013-05-16: 999 mL/h via INTRAVENOUS

## 2013-05-16 MED ORDER — DIPHENHYDRAMINE HCL 50 MG/ML IJ SOLN
INTRAMUSCULAR | Status: AC
  Filled 2013-05-16: qty 1

## 2013-05-16 MED ORDER — PROMETHAZINE HCL 25 MG/ML IJ SOLN
25.0000 mg | Freq: Once | INTRAMUSCULAR | Status: AC
  Administered 2013-05-16: 25 mg via INTRAVENOUS
  Filled 2013-05-16: qty 1

## 2013-05-16 NOTE — ED Notes (Signed)
Called to patients room, she stated she felt like she was having seizures, pt was jerking arms back and forth while at bedside, checked blood glucose and it resulted at 214, d5ns stopped and md notified

## 2013-05-16 NOTE — ED Notes (Signed)
Pt ambulated to bathroom, independently

## 2013-05-16 NOTE — ED Notes (Signed)
Pt with erratic movements in bed, stating she cant stop the muscle jerks, vs stable, comfort measures provided, md notified

## 2013-05-16 NOTE — ED Notes (Signed)
Pt states she drank about at liter bottle of h2o today, unable to keep but half down. Reports that she is a type 2 diabetic and called md, who told her to come to er to be evaluated. They did call in zofran for patient but she did not get filled because she states that zofran does not work, only Health visitorphenegran does. Pt is allergic to reglan., denies abdominal pain

## 2013-05-16 NOTE — ED Notes (Signed)
cbg 76 at 0107 am

## 2013-05-16 NOTE — ED Notes (Signed)
Fluids provided.

## 2013-05-16 NOTE — ED Provider Notes (Signed)
CSN: [id number]     Arrival date & time 05/15/13  2219 History   First MD Initiated Contact with Patient 05/16/13 0209     Chief Complaint  Patient presents with  . Vomiting      (Consider location/radiation/quality/duration/timing/severity/associated sxs/prior Treatment) HPI 33 year old female with history of diabetes. She has had 2 days of nausea, vomiting and diarrhea. She has not had anything to eat in over 24 hours. Her primary care physician advised her to not take her antihyperglycemic medications yesterday. She was called and Zofran but this has not relieved her nausea and vomiting. She is requesting Phenergan. She states she feels dehydrated. She denies abdominal pain but does feel weak.  Past Medical History  Diagnosis Date  . Migraine   . Depression   . Bipolar 1 disorder   . Otitis media   . Strep throat   . Fibromyalgia 04/2012  . Restless leg syndrome   . Menorrhagia   . Anxiety   . Asthma     as a child- no inhaler  . Diabetes mellitus without complication    Past Surgical History  Procedure Laterality Date  . Tonsillectomy    . Cholecystectomy    . Tubal ligation    . Tympanostomy    . Laparoscopic assisted vaginal hysterectomy N/A 10/08/2012    Procedure: LAPAROSCOPIC ASSISTED VAGINAL HYSTERECTOMY;  Surgeon: Willodean Rosenthal, MD;  Location: [facility];  Service: Gynecology;  Laterality: N/A;  . Bilateral salpingectomy Bilateral 10/08/2012    Procedure: BILATERAL SALPINGECTOMY;  Surgeon: Willodean Rosenthal, MD;  Location: [facility];  Service: Gynecology;  Laterality: Bilateral;   Family History  Problem Relation Age of Onset  . Depression Mother    History  Substance Use Topics  . Smoking status: Current Every Day Smoker -- 1.00 packs/day for 15 years    Types: Cigarettes  . Smokeless tobacco: Never Used  . Alcohol Use: No   OB History   Grav Para Term Preterm Abortions TAB SAB Ect Mult Living   6 3 2 1 3  3   3      Review of Systems  All other  systems reviewed and are negative.   Allergies  Morphine and related and Reglan  Home Medications   Current Outpatient Rx  Name  Route  Sig  Dispense  Refill  . Aspirin-Acetaminophen-Caffeine (GOODY HEADACHE PO)   Oral   Take 1 packet by mouth daily as needed (aches/pain).         . busPIRone (BUSPAR) 5 MG tablet   Oral   Take 5 mg by mouth 2 (two) times daily.         Marland Kitchen desvenlafaxine (PRISTIQ) 50 MG 24 hr tablet   Oral   Take 50 mg by mouth daily.         Marland Kitchen gabapentin (NEURONTIN) 600 MG tablet   Oral   Take 600 mg by mouth 3 (three) times daily.         . hydrOXYzine (ATARAX/VISTARIL) 25 MG tablet   Oral   Take 50 mg by mouth at bedtime.         Marland Kitchen loperamide (IMODIUM) 2 MG capsule   Oral   Take 1 capsule (2 mg total) by mouth 4 (four) times daily as needed for diarrhea or loose stools.   30 capsule   0   . meloxicam (MOBIC) 7.5 MG tablet   Oral   Take 1 tablet (7.5 mg total) by mouth daily.   7 tablet   0   .  metFORMIN (GLUCOPHAGE) 500 MG tablet   Oral   Take 250 mg by mouth daily after supper.         Marland Kitchen. oxyCODONE-acetaminophen (PERCOCET/ROXICET) 5-325 MG per tablet   Oral   Take 1-2 tablets by mouth every 6 (six) hours as needed for pain.   30 tablet   0   . QUEtiapine (SEROQUEL) 25 MG tablet   Oral   Take 25 mg by mouth at bedtime.         . Rivaroxaban (XARELTO) 20 MG TABS tablet   Oral   Take 1 tablet (20 mg total) by mouth daily. Take for 3 months, after the 15 mg bid for 3 wks, take with food   30 tablet   2   . tiZANidine (ZANAFLEX) 4 MG tablet   Oral   Take 4 mg by mouth every 6 (six) hours as needed (muscle spasms).         . topiramate (TOPAMAX) 25 MG tablet   Oral   Take 75 mg by mouth 2 (two) times daily.          . traMADol (ULTRAM) 50 MG tablet   Oral   Take 1 tablet (50 mg total) by mouth every 6 (six) hours as needed.   15 tablet   0    BP 129/77  Pulse 72  Temp(Src) 98.5 F (36.9 C) (Oral)  Resp 18   Ht 5\' 1"  (1.549 m)  Wt 152 lb (68.947 kg)  BMI 28.74 kg/m2  SpO2 100%  LMP 10/02/2012  Physical Exam General: Well-developed, well-nourished female in no acute distress; appearance consistent with age of record HENT: normocephalic; atraumatic; multiple dental caries; mucous membranes moist Eyes: pupils equal, round and reactive to light; extraocular muscles intact Neck: supple Heart: regular rate and rhythm Lungs: clear to auscultation bilaterally Abdomen: soft; nondistended; epigastric tenderness; no masses or hepatosplenomegaly; bowel sounds present Extremities: No deformity; full range of motion; pulses normal Neurologic: Awake, alert and oriented; motor function intact in all extremities and symmetric; no facial droop Skin: Warm and dry Psychiatric: Flat affect    ED Course  Procedures (including critical care time)  MDM   Nursing notes and vitals signs, including pulse oximetry, reviewed.  Summary of this visit's results, reviewed by myself:  Labs:  Results for orders placed during the hospital encounter of 05/16/13 (from the past 24 hour(s))  URINALYSIS, ROUTINE W REFLEX MICROSCOPIC     Status: Abnormal   Collection Time    05/15/13 10:45 PM      Result Value Ref Range   Color, Urine YELLOW  YELLOW   APPearance CLOUDY (*) CLEAR   Specific Gravity, Urine 1.016  1.005 - 1.030   pH 5.5  5.0 - 8.0   Glucose, UA NEGATIVE  NEGATIVE mg/dL   Hgb urine dipstick NEGATIVE  NEGATIVE   Bilirubin Urine NEGATIVE  NEGATIVE   Ketones, ur NEGATIVE  NEGATIVE mg/dL   Protein, ur NEGATIVE  NEGATIVE mg/dL   Urobilinogen, UA 0.2  0.0 - 1.0 mg/dL   Nitrite NEGATIVE  NEGATIVE   Leukocytes, UA NEGATIVE  NEGATIVE  CBG MONITORING, ED     Status: None   Collection Time    05/15/13 10:55 PM      Result Value Ref Range   Glucose-Capillary 87  70 - 99 mg/dL   1:614:05 AM Drinking fluids without emesis after IV fluid bolus and Phenergan.     Hanley SeamenJohn L Sabre Romberger, MD 05/16/13 [phone]

## 2013-06-30 ENCOUNTER — Emergency Department (HOSPITAL_BASED_OUTPATIENT_CLINIC_OR_DEPARTMENT_OTHER)
Admission: EM | Admit: 2013-06-30 | Discharge: 2013-06-30 | Disposition: A | Discharge status: Home / Self Care | Payer: Medicaid Other | Attending: Emergency Medicine | Admitting: Emergency Medicine

## 2013-06-30 ENCOUNTER — Emergency Department (HOSPITAL_BASED_OUTPATIENT_CLINIC_OR_DEPARTMENT_OTHER): Payer: Medicaid Other

## 2013-06-30 ENCOUNTER — Encounter (HOSPITAL_BASED_OUTPATIENT_CLINIC_OR_DEPARTMENT_OTHER): Payer: Self-pay | Admitting: Emergency Medicine

## 2013-06-30 DIAGNOSIS — F17200 Nicotine dependence, unspecified, uncomplicated: Secondary | ICD-10-CM | POA: Insufficient documentation

## 2013-06-30 DIAGNOSIS — S46911A Strain of unspecified muscle, fascia and tendon at shoulder and upper arm level, right arm, initial encounter: Secondary | ICD-10-CM

## 2013-06-30 DIAGNOSIS — F411 Generalized anxiety disorder: Secondary | ICD-10-CM | POA: Insufficient documentation

## 2013-06-30 DIAGNOSIS — IMO0002 Reserved for concepts with insufficient information to code with codable children: Secondary | ICD-10-CM | POA: Insufficient documentation

## 2013-06-30 DIAGNOSIS — Z791 Long term (current) use of non-steroidal anti-inflammatories (NSAID): Secondary | ICD-10-CM | POA: Insufficient documentation

## 2013-06-30 DIAGNOSIS — Y92410 Unspecified street and highway as the place of occurrence of the external cause: Secondary | ICD-10-CM | POA: Insufficient documentation

## 2013-06-30 DIAGNOSIS — J45909 Unspecified asthma, uncomplicated: Secondary | ICD-10-CM | POA: Insufficient documentation

## 2013-06-30 DIAGNOSIS — G2581 Restless legs syndrome: Secondary | ICD-10-CM | POA: Insufficient documentation

## 2013-06-30 DIAGNOSIS — G43909 Migraine, unspecified, not intractable, without status migrainosus: Secondary | ICD-10-CM | POA: Insufficient documentation

## 2013-06-30 DIAGNOSIS — Z79899 Other long term (current) drug therapy: Secondary | ICD-10-CM | POA: Insufficient documentation

## 2013-06-30 DIAGNOSIS — Z8619 Personal history of other infectious and parasitic diseases: Secondary | ICD-10-CM | POA: Insufficient documentation

## 2013-06-30 DIAGNOSIS — Z8742 Personal history of other diseases of the female genital tract: Secondary | ICD-10-CM | POA: Insufficient documentation

## 2013-06-30 DIAGNOSIS — E119 Type 2 diabetes mellitus without complications: Secondary | ICD-10-CM | POA: Insufficient documentation

## 2013-06-30 DIAGNOSIS — F3130 Bipolar disorder, current episode depressed, mild or moderate severity, unspecified: Secondary | ICD-10-CM | POA: Insufficient documentation

## 2013-06-30 DIAGNOSIS — Y9389 Activity, other specified: Secondary | ICD-10-CM | POA: Insufficient documentation
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
## 2013-06-30 NOTE — Discharge Instructions (Signed)
If you were given medicines take as directed.  If you are on coumadin or contraceptives realize their levels and effectiveness is altered by many different medicines.  If you have any reaction (rash, tongues swelling, other) to the medicines stop taking and see a physician.   Please follow up as directed and return to the ER or see a physician for new or worsening symptoms.  Thank you. Filed Vitals:   06/30/13 0222  BP: 108/68  Pulse: 79  Temp: 98.1 F (36.7 C)  Resp: 18  Height: 5\' 1"  (1.549 m)  Weight: 150 lb (68.04 kg)  SpO2: 99%   Followup with orthopedics as discussed. Take ibuprofen, Tylenol and use ice for pain.

## 2013-06-30 NOTE — ED Notes (Signed)
Returned from xray

## 2013-06-30 NOTE — ED Notes (Signed)
MD with pt  

## 2013-06-30 NOTE — ED Notes (Signed)
Transported to xray 
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
## 2013-10-15 ENCOUNTER — Encounter (HOSPITAL_BASED_OUTPATIENT_CLINIC_OR_DEPARTMENT_OTHER): Payer: Self-pay | Admitting: Emergency Medicine

## 2013-10-15 ENCOUNTER — Emergency Department (HOSPITAL_BASED_OUTPATIENT_CLINIC_OR_DEPARTMENT_OTHER)
Admission: EM | Admit: 2013-10-15 | Discharge: 2013-10-15 | Disposition: A | Discharge status: Home / Self Care | Payer: Medicaid Other | Attending: Emergency Medicine | Admitting: Emergency Medicine

## 2013-10-15 DIAGNOSIS — G8929 Other chronic pain: Secondary | ICD-10-CM | POA: Diagnosis not present

## 2013-10-15 DIAGNOSIS — G2581 Restless legs syndrome: Secondary | ICD-10-CM | POA: Diagnosis not present

## 2013-10-15 DIAGNOSIS — F411 Generalized anxiety disorder: Secondary | ICD-10-CM | POA: Diagnosis not present

## 2013-10-15 DIAGNOSIS — T7421XA Adult sexual abuse, confirmed, initial encounter: Secondary | ICD-10-CM | POA: Diagnosis not present

## 2013-10-15 DIAGNOSIS — Z791 Long term (current) use of non-steroidal anti-inflammatories (NSAID): Secondary | ICD-10-CM | POA: Insufficient documentation

## 2013-10-15 DIAGNOSIS — E119 Type 2 diabetes mellitus without complications: Secondary | ICD-10-CM | POA: Insufficient documentation

## 2013-10-15 DIAGNOSIS — R079 Chest pain, unspecified: Secondary | ICD-10-CM | POA: Diagnosis not present

## 2013-10-15 DIAGNOSIS — Z8739 Personal history of other diseases of the musculoskeletal system and connective tissue: Secondary | ICD-10-CM | POA: Insufficient documentation

## 2013-10-15 DIAGNOSIS — Z7901 Long term (current) use of anticoagulants: Secondary | ICD-10-CM | POA: Diagnosis not present

## 2013-10-15 DIAGNOSIS — Z8619 Personal history of other infectious and parasitic diseases: Secondary | ICD-10-CM | POA: Insufficient documentation

## 2013-10-15 DIAGNOSIS — F319 Bipolar disorder, unspecified: Secondary | ICD-10-CM | POA: Diagnosis not present

## 2013-10-15 DIAGNOSIS — G43909 Migraine, unspecified, not intractable, without status migrainosus: Secondary | ICD-10-CM | POA: Diagnosis not present

## 2013-10-15 DIAGNOSIS — F17200 Nicotine dependence, unspecified, uncomplicated: Secondary | ICD-10-CM | POA: Diagnosis not present

## 2013-10-15 DIAGNOSIS — J45909 Unspecified asthma, uncomplicated: Secondary | ICD-10-CM | POA: Diagnosis not present

## 2013-10-15 DIAGNOSIS — IMO0002 Reserved for concepts with insufficient information to code with codable children: Secondary | ICD-10-CM

## 2013-10-15 DIAGNOSIS — Z8742 Personal history of other diseases of the female genital tract: Secondary | ICD-10-CM | POA: Insufficient documentation

## 2013-10-15 DIAGNOSIS — Z79899 Other long term (current) drug therapy: Secondary | ICD-10-CM | POA: Diagnosis not present

## 2013-10-15 MED ORDER — CYCLOBENZAPRINE HCL 10 MG PO TABS
5.0000 mg | ORAL_TABLET | Freq: Once | ORAL | Status: AC
  Administered 2013-10-15: 5 mg via ORAL
  Filled 2013-10-15: qty 1

## 2013-10-15 MED ORDER — OXYCODONE-ACETAMINOPHEN 5-325 MG PO TABS
1.0000 | ORAL_TABLET | Freq: Once | ORAL | Status: AC
  Administered 2013-10-15: 1 via ORAL
  Filled 2013-10-15: qty 1

## 2013-10-15 MED ORDER — METRONIDAZOLE 500 MG PO TABS
ORAL_TABLET | ORAL | Status: AC
  Administered 2013-10-15: 1000 mg
  Filled 2013-10-15: qty 4

## 2013-10-15 MED ORDER — AZITHROMYCIN 250 MG PO TABS
ORAL_TABLET | ORAL | Status: AC
  Administered 2013-10-15: 1000 mg
  Filled 2013-10-15: qty 4

## 2013-10-15 MED ORDER — CIPROFLOXACIN HCL 500 MG PO TABS
ORAL_TABLET | ORAL | Status: AC
Start: 2013-10-15 — End: 2013-10-15
  Administered 2013-10-15: 500 mg
  Filled 2013-10-15: qty 1
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
## 2013-10-15 NOTE — ED Notes (Signed)
Amy, RN, Sane at bedside and pt ambulatory for SANE exam.
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
## 2013-10-15 NOTE — ED Notes (Signed)
Called to room by SANE RN, pt c/o back pain and requesting pain medication.

## 2013-10-15 NOTE — ED Notes (Signed)
MD at bedside. 
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
## 2013-10-15 NOTE — ED Notes (Signed)
Officer Kimberly-ClarkYokely informed of plan of care
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
## 2013-10-15 NOTE — ED Notes (Signed)
Returned from SANE exam  

## 2013-10-19 IMAGING — US US PELVIS COMPLETE
1 series · 13 of 25 positions shown · non-contrast
Comparison: Pelvic ultrasound - 12/18/2011; CT of the pelvis -
02/06/2012

CLINICAL DATA: Hysterectomy and bilateral salpingectomy
(10/08/2012), now with lower abdominal pain.



[Series 1: us pelvis complete · 13 of 50 slices shown]
[im 1/50]
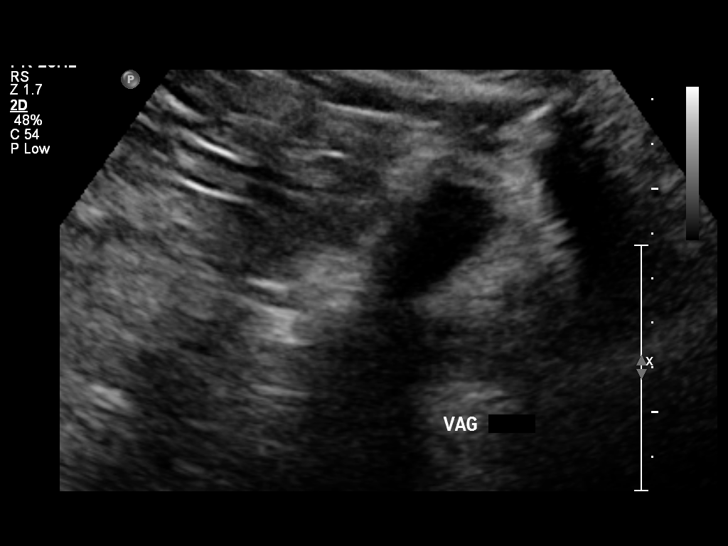
[im 5/50]
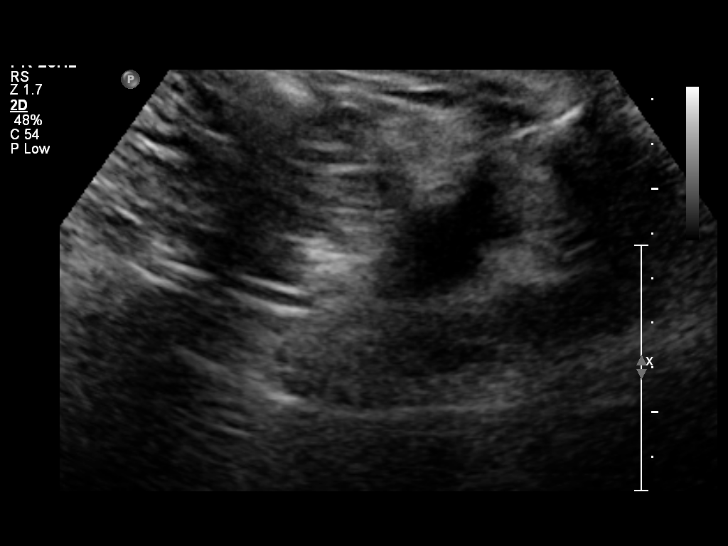
[im 9/50]
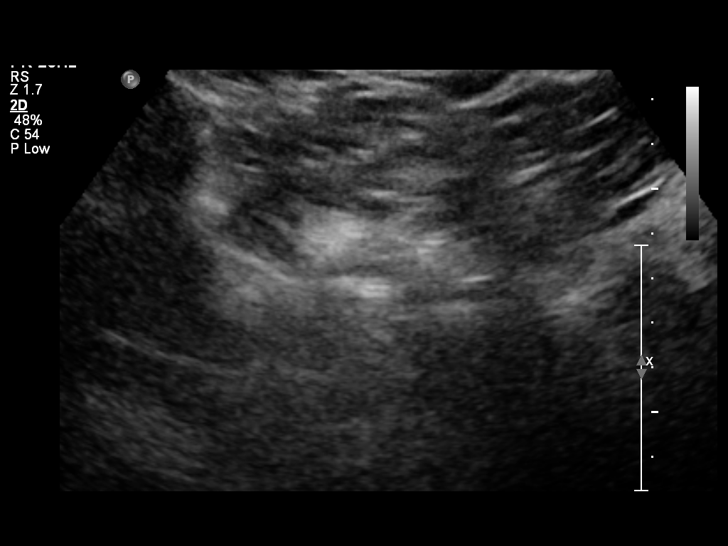
[im 13/50]
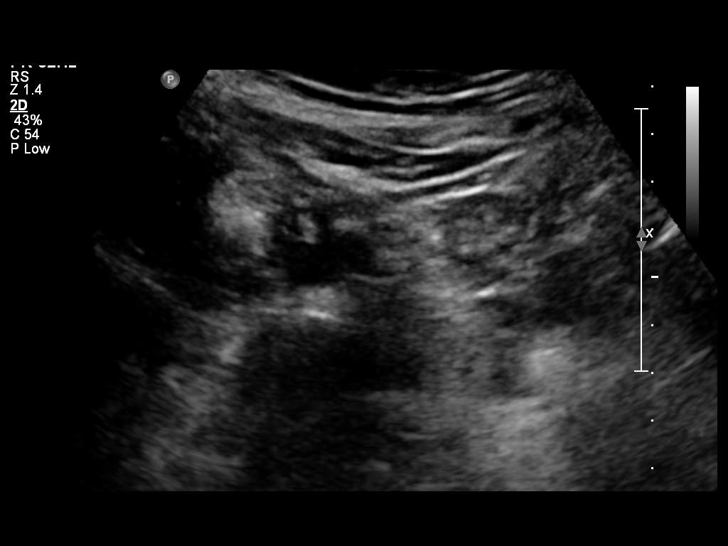
[im 17/50]
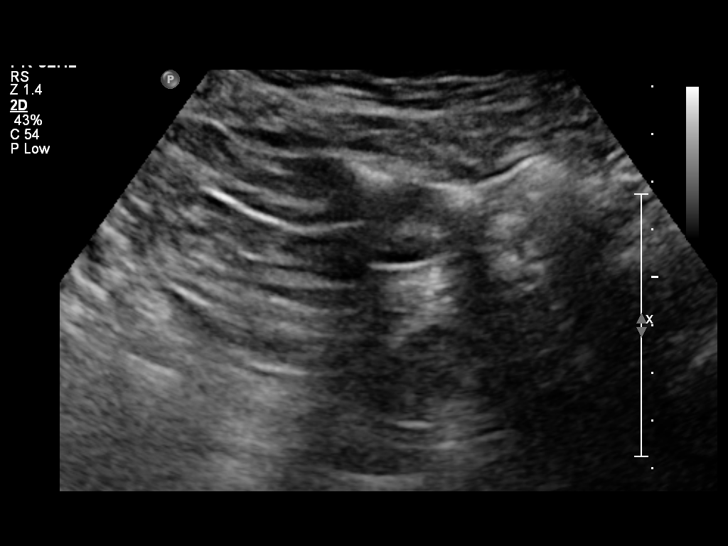
[im 21/50]
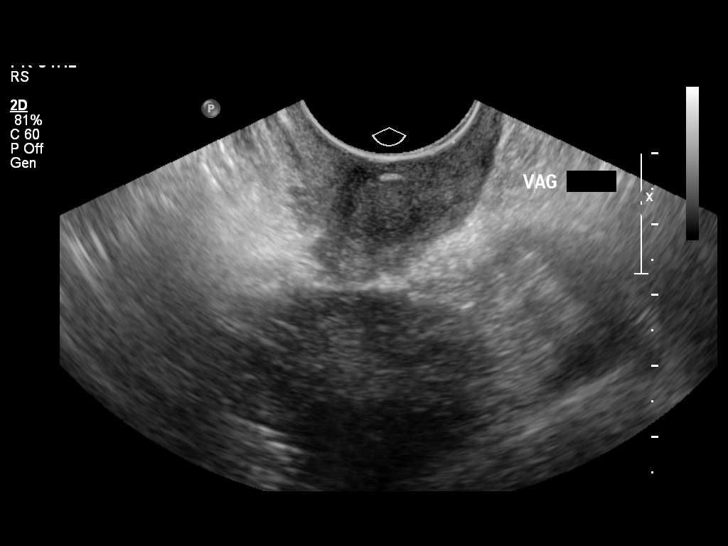
[im 25/50]
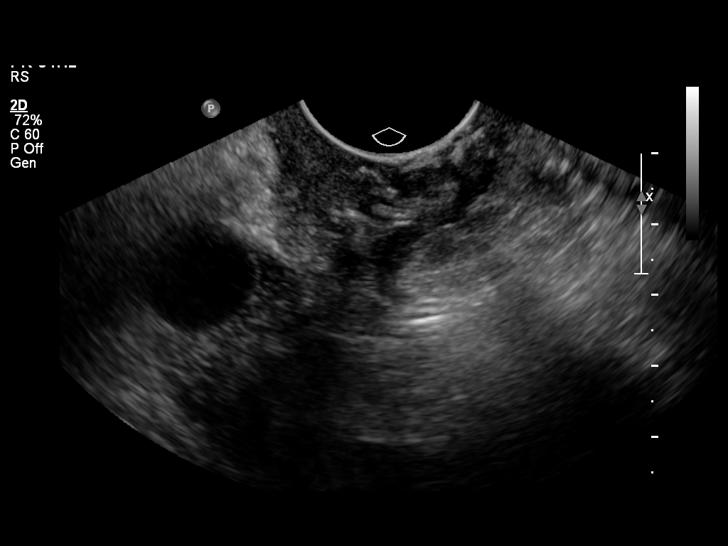
[im 29/50]
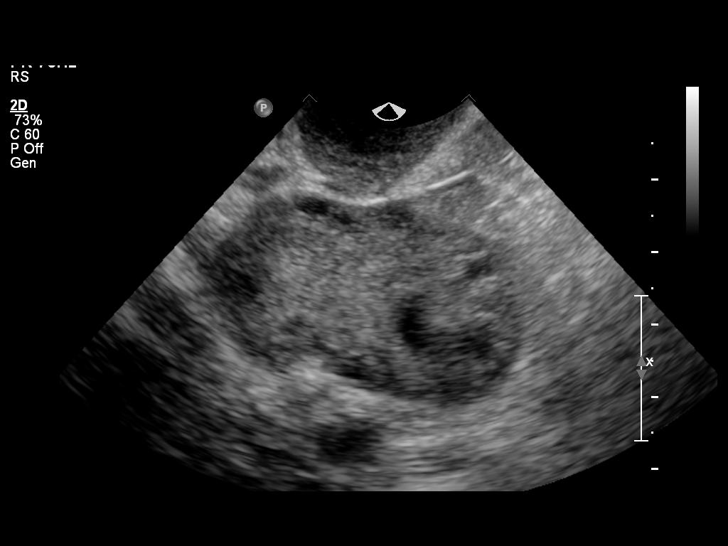
[im 33/50]
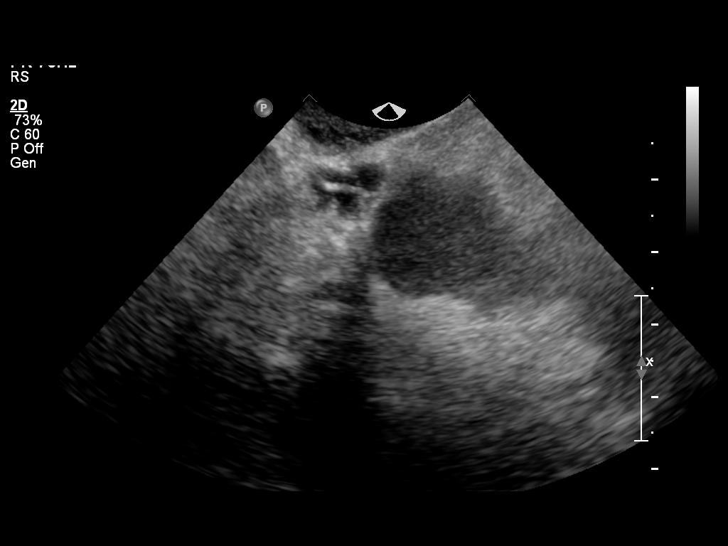
[im 37/50]
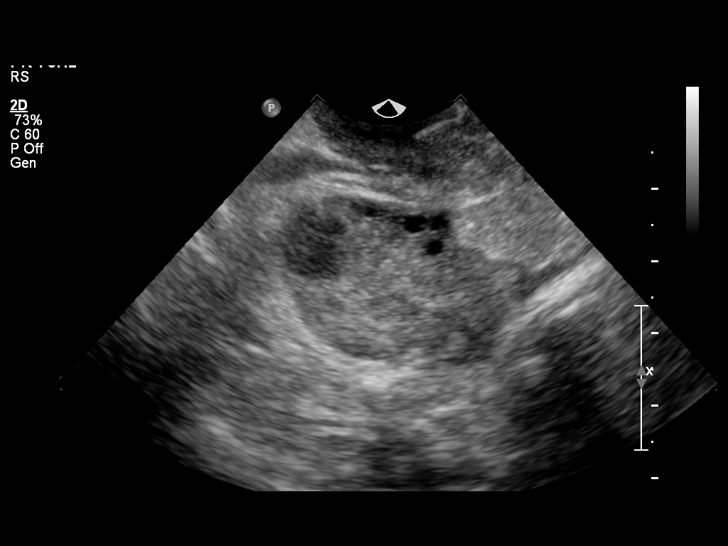
[im 41/50]
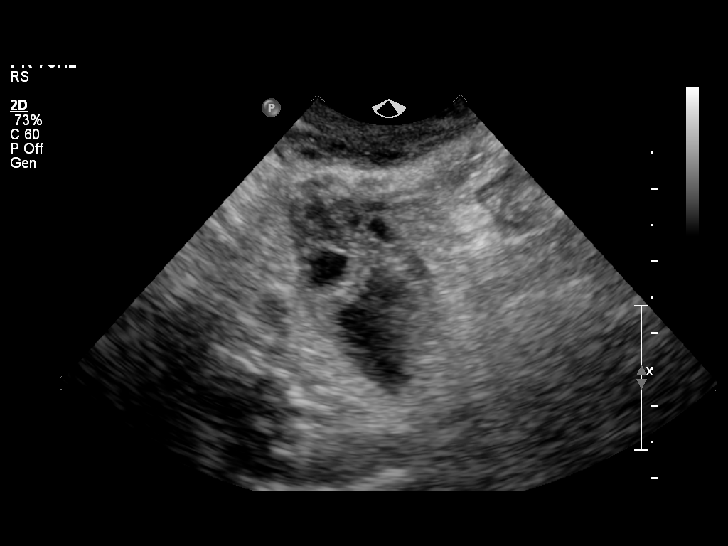
[im 45/50]
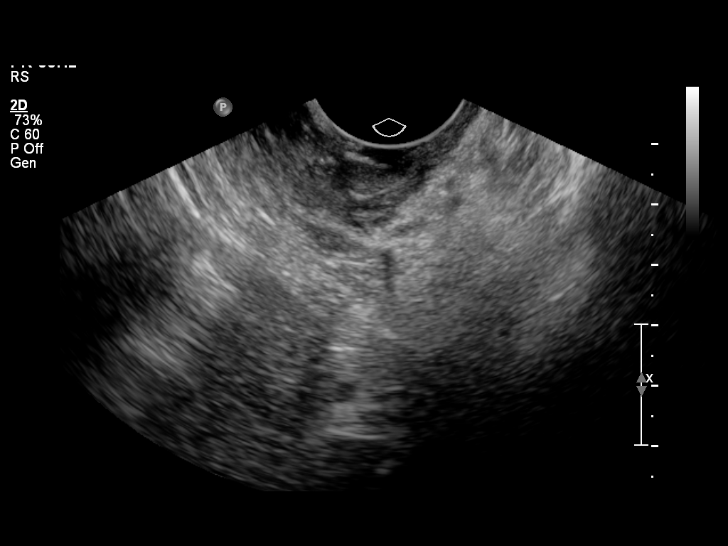
[im 50/50]
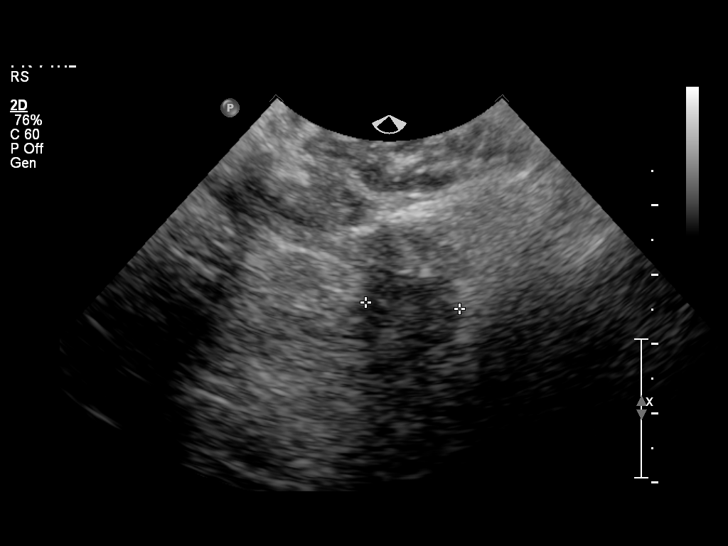

[13 of 25 positions shown; findings below may reference images not displayed]

FINDINGS: Uterus: Surgically absent

Right ovary:  The right ovary is borderline enlarged in size
measuring 4.5 x 2.6 x 3.2 cm.  There are several peripheral
minimally complex presumably physiologic cysts within the periphery
of the right ovary, thelargest of which measures approximately
cm in diameter (image 41). No right-side adnexal mass.

Left ovary: Normal in size measuring 2.5 x 1.2 x 1.4 cm.  No
discrete left-sided ovarian or adnexal mass.

Other findings: Trace amount of free fluid in the pelvic cul-de-
sac, presumably physiologic.
IMPRESSION: 1.   No explanation for patient's persistent lower abdominal pain
post recent hysterectomy.
2.  Several peripheral, minimally complex presumably physiologic
cysts/follicles are identified within an otherwise normal-appearing
right ovary, the largest of which measures approximately 1.4 cm in
diameter.  This is almost certainly benign, and no specific imaging
follow up is recommended according to the Society of Radiologists
in Ultrasound 9858 Consensus  Conference Statement (Matii Claramunt et al.
Management of Asymptomatic Ovarian and Other Adnexal Cysts Imaged
at [place]:  Society of Radiologists in Ultrasound Consensus Conference

## 2013-10-19 IMAGING — CT CT ABD-PELV W/ CM
1 of 3 series · 14 of 32 positions shown, 19 images · IV contrast (OMNIPAQUE)
Comparison: 10/30/2012 ultrasound, 12/07/2011 CT

CLINICAL DATA: Abdominal pain

CT ABDOMEN AND PELVIS WITH CONTRAST
TECHNIQUE: Multidetector CT imaging of the abdomen and pelvis was
performed following the standard protocol during bolus
administration of intravenous contrast.
Contrast: 100mL OMNIPAQUE IOHEXOL 300 MG/ML  SOLN

[Series 2: routine abdomen/pelvis with · axial · 0.72mm/px · z∈[+497,+902]mm · 14 of 91 slices shown, 19 images]
[im 5/91  soft-tissue]
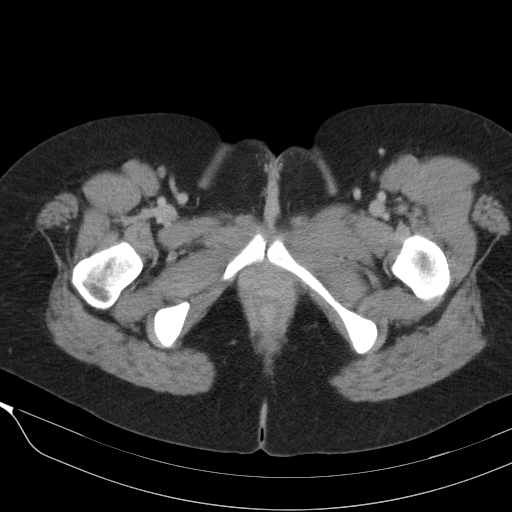
[im 5/91  bone]
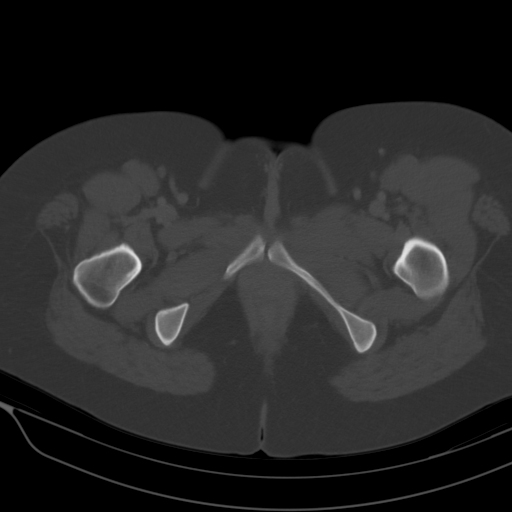
[im 15/91  soft-tissue]
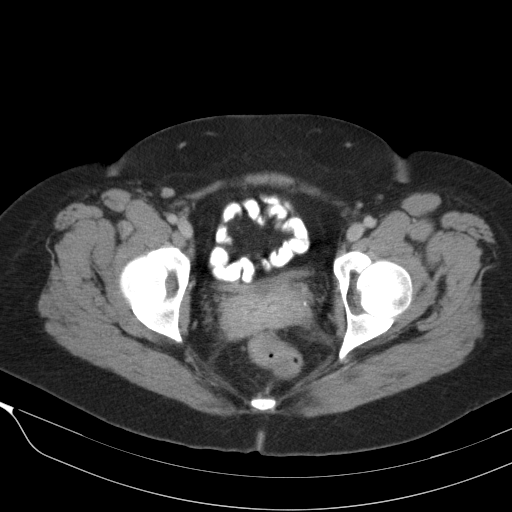
[im 19/91  soft-tissue]
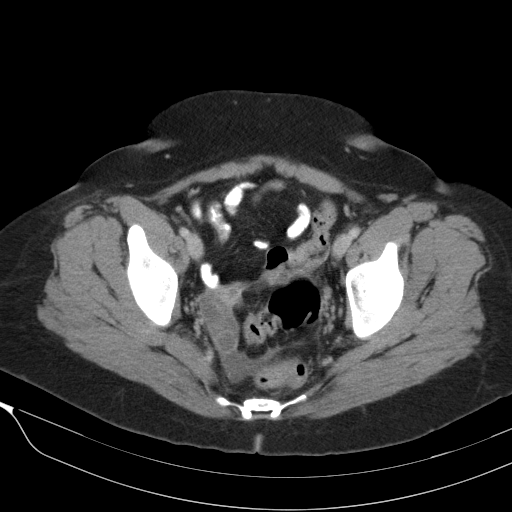
[im 24/91  soft-tissue]
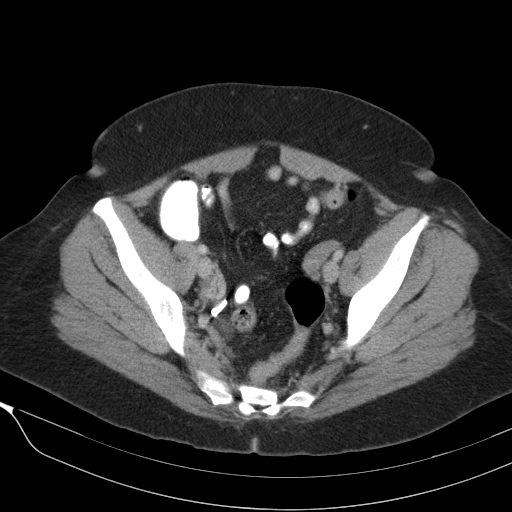
[im 34/91  soft-tissue]
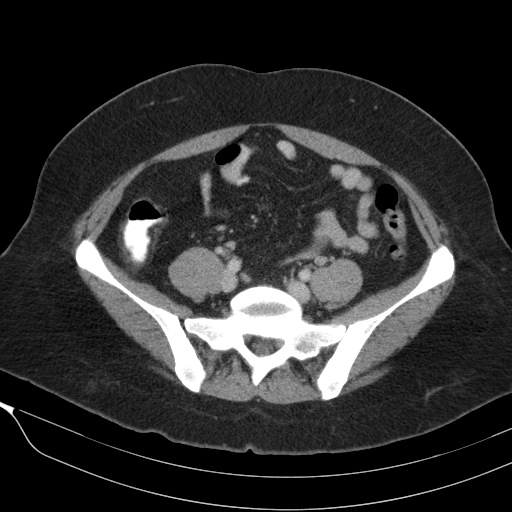
[im 38/91  soft-tissue]
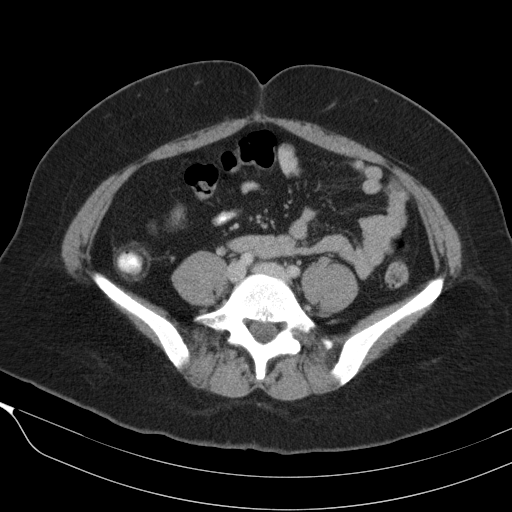
[im 48/91  soft-tissue]
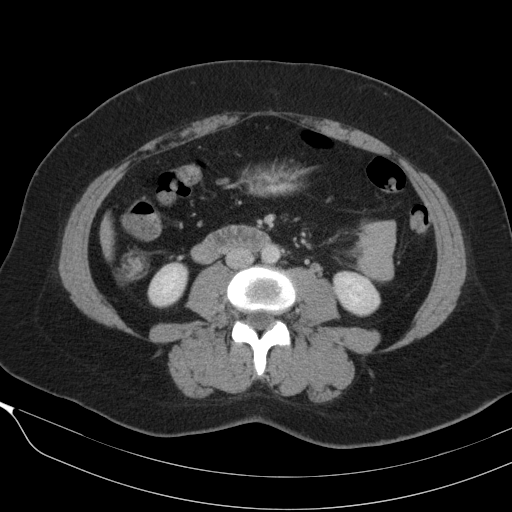
[im 53/91  soft-tissue]
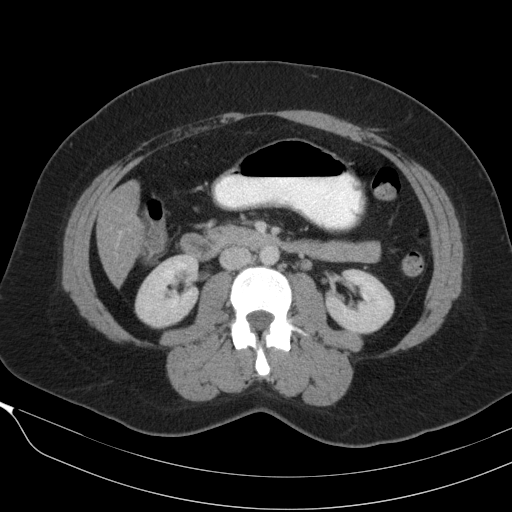
[im 57/91  soft-tissue]
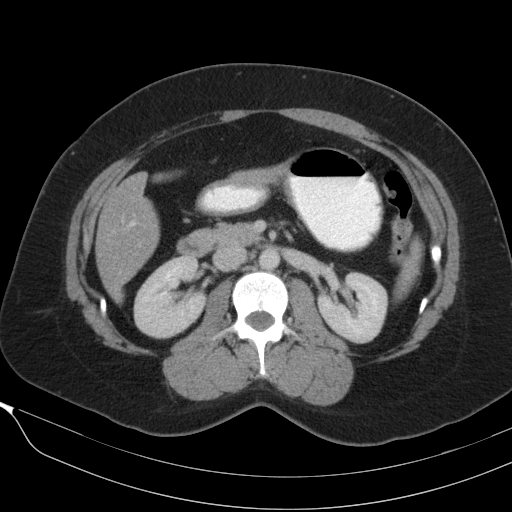
[im 57/91  bone]
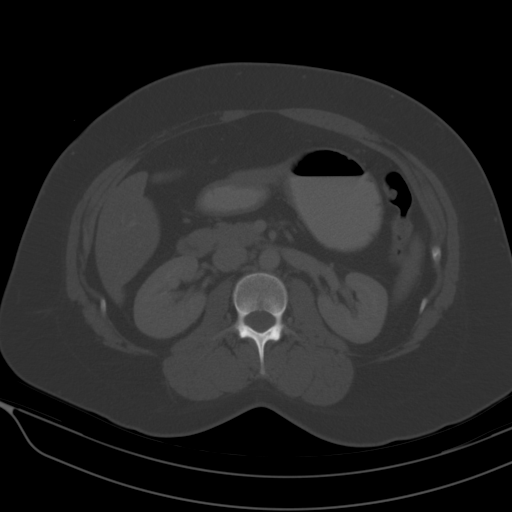
[im 67/91  soft-tissue]
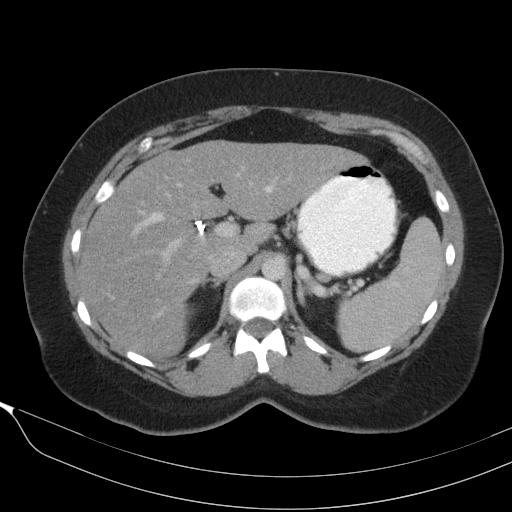
[im 72/91  soft-tissue]
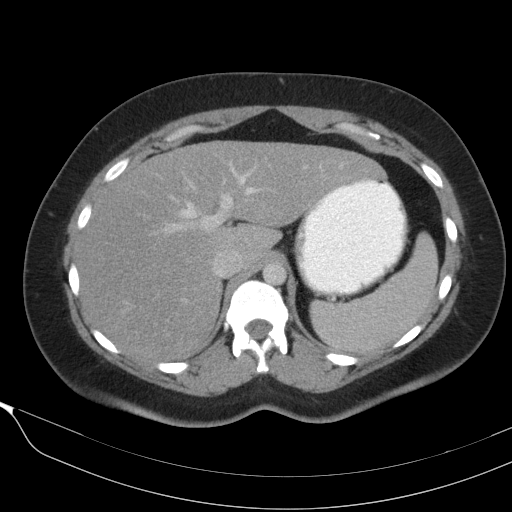
[im 72/91  lung]
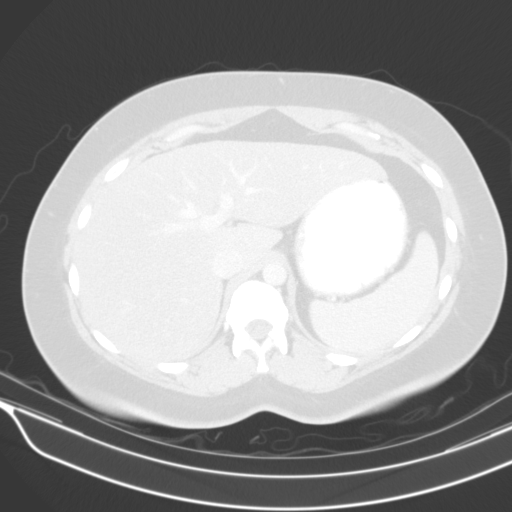
[im 76/91  soft-tissue]
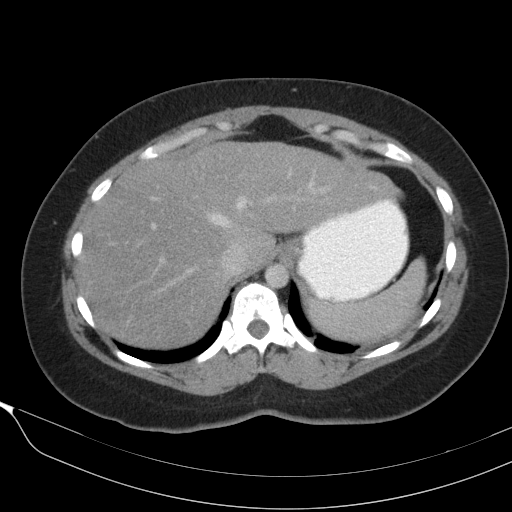
[im 76/91  lung]
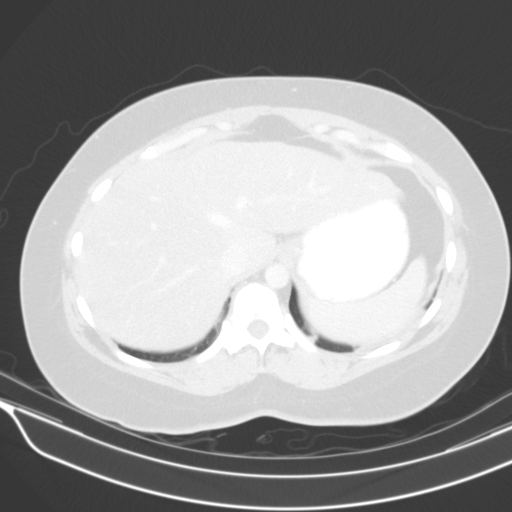
[im 81/91  lung]
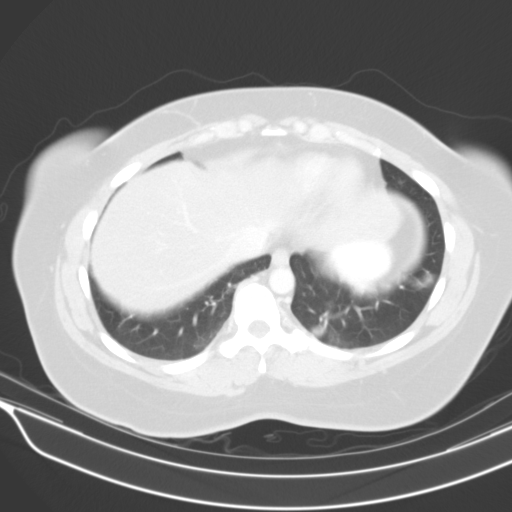
[im 86/91  soft-tissue]
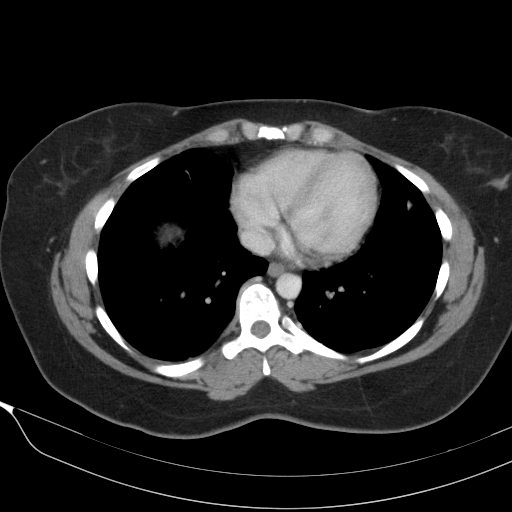
[im 86/91  lung]
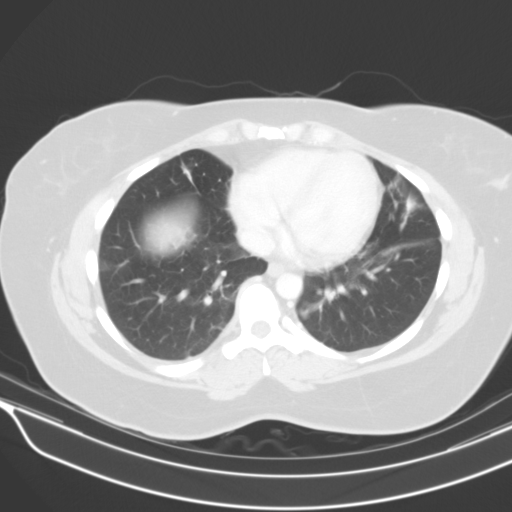

[14 of 32 positions shown; findings below may reference images not displayed]

FINDINGS: Mild middle lobe, lingula and left greater than right
lower lobe linear opacities, favor atelectasis.  Normal heart size.

Hepatic steatosis.  Cholecystectomy.  No biliary ductal dilatation.
Unremarkable spleen, pancreas, adrenal glands, kidneys.  No
hydroureteronephrosis.

No CT evidence for colitis. Submucosal fatty infiltration of the cm
the sequelae of prior inflammation.  Normal appendix.  No free
intraperitoneal air.  No lymphadenopathy. Tiny fat containing
umbilical hernia.

Normal caliber aorta and branch vessels. While the timing of the
examination is not optimized to evaluate the veins, asymmetric
enlargement and central hypoattenuation suggest nonocclusive left
ovarian vein thrombosis (coronal image 48 as index).

Decompressed bladder.  Absent uterus.  Right adnexal tubal ligation
clip.  Probable corpus luteal cyst on the right and trace free
fluid within the pelvis.

No acute osseous finding. Locule of gas within the right posterior
subcutaneous tissues, presumably an injection site.
IMPRESSION: Status post hysterectomy.

Right corpus luteal cyst and trace free pelvic fluid.

Nonocclusive left ovarian vein thrombosis suspected.

## 2013-10-20 ENCOUNTER — Emergency Department (HOSPITAL_BASED_OUTPATIENT_CLINIC_OR_DEPARTMENT_OTHER)
Admission: EM | Admit: 2013-10-20 | Discharge: 2013-10-21 | Disposition: A | Discharge status: Home / Self Care | Payer: Medicaid Other | Attending: Emergency Medicine | Admitting: Emergency Medicine

## 2013-10-20 ENCOUNTER — Encounter (HOSPITAL_BASED_OUTPATIENT_CLINIC_OR_DEPARTMENT_OTHER): Payer: Self-pay | Admitting: Emergency Medicine

## 2013-10-20 DIAGNOSIS — M5430 Sciatica, unspecified side: Secondary | ICD-10-CM | POA: Diagnosis not present

## 2013-10-20 DIAGNOSIS — Z8669 Personal history of other diseases of the nervous system and sense organs: Secondary | ICD-10-CM | POA: Insufficient documentation

## 2013-10-20 DIAGNOSIS — IMO0001 Reserved for inherently not codable concepts without codable children: Secondary | ICD-10-CM | POA: Insufficient documentation

## 2013-10-20 DIAGNOSIS — M545 Low back pain, unspecified: Secondary | ICD-10-CM | POA: Insufficient documentation

## 2013-10-20 DIAGNOSIS — J45909 Unspecified asthma, uncomplicated: Secondary | ICD-10-CM | POA: Diagnosis not present

## 2013-10-20 DIAGNOSIS — Z8742 Personal history of other diseases of the female genital tract: Secondary | ICD-10-CM | POA: Insufficient documentation

## 2013-10-20 DIAGNOSIS — Z8619 Personal history of other infectious and parasitic diseases: Secondary | ICD-10-CM | POA: Insufficient documentation

## 2013-10-20 DIAGNOSIS — F411 Generalized anxiety disorder: Secondary | ICD-10-CM | POA: Insufficient documentation

## 2013-10-20 DIAGNOSIS — F17200 Nicotine dependence, unspecified, uncomplicated: Secondary | ICD-10-CM | POA: Diagnosis not present

## 2013-10-20 DIAGNOSIS — M5432 Sciatica, left side: Secondary | ICD-10-CM

## 2013-10-20 DIAGNOSIS — E119 Type 2 diabetes mellitus without complications: Secondary | ICD-10-CM | POA: Diagnosis not present

## 2013-10-20 DIAGNOSIS — Z79899 Other long term (current) drug therapy: Secondary | ICD-10-CM | POA: Diagnosis not present

## 2013-10-20 DIAGNOSIS — F319 Bipolar disorder, unspecified: Secondary | ICD-10-CM | POA: Diagnosis not present

## 2013-10-20 DIAGNOSIS — G43909 Migraine, unspecified, not intractable, without status migrainosus: Secondary | ICD-10-CM | POA: Diagnosis not present

## 2013-10-20 NOTE — ED Provider Notes (Signed)
CSN: [id number]     Arrival date & time 10/20/13  2154 History  This chart was scribed for Nyelah Emmerich Smitty Cords, MD by Charline Bills, ED Scribe. The patient was seen in room MH12/MH12. Patient's care was started at 12:01 AM.   Chief Complaint  Patient presents with  . Back Pain   Patient is a 33 y.o. female presenting with back pain. The history is provided by the patient. No language interpreter was used.  Back Pain Location:  Sacro-iliac joint Quality:  Unable to specify Radiates to:  L posterior upper leg Pain severity:  Mild Pain is:  Same all the time Onset quality:  Gradual Timing:  Constant Progression:  Worsening Chronicity:  Chronic Context: not MCA   Relieved by:  Nothing Worsened by:  Nothing tried Ineffective treatments:  None tried Associated symptoms: no abdominal pain, no abdominal swelling, no bladder incontinence, no bowel incontinence, no chest pain, no dysuria, no fever, no headaches, no leg pain, no numbness, no paresthesias, no pelvic pain, no perianal numbness, no tingling, no weakness and no weight loss    HPI Comments: Serenidy Waltz is a 33 y.o. female, with a h/o sciatica, who presents to the Emergency Department complaining of gradually worsening lower back pain.    Past Medical History  Diagnosis Date  . Migraine   . Depression   . Bipolar 1 disorder   . Otitis media   . Strep throat   . Fibromyalgia 04/2012  . Restless leg syndrome   . Menorrhagia   . Anxiety   . Asthma     as a child- no inhaler  . Diabetes mellitus without complication    Past Surgical History  Procedure Laterality Date  . Tonsillectomy    . Cholecystectomy    . Tubal ligation    . Tympanostomy    . Laparoscopic assisted vaginal hysterectomy N/A 10/08/2012    Procedure: LAPAROSCOPIC ASSISTED VAGINAL HYSTERECTOMY;  Surgeon: Willodean Rosenthal, MD;  Location: [facility];  Service: Gynecology;  Laterality: N/A;  . Bilateral salpingectomy Bilateral 10/08/2012    Procedure:  BILATERAL SALPINGECTOMY;  Surgeon: Willodean Rosenthal, MD;  Location: [facility];  Service: Gynecology;  Laterality: Bilateral;   Family History  Problem Relation Age of Onset  . Depression Mother    History  Substance Use Topics  . Smoking status: Current Every Day Smoker -- 1.00 packs/day for 15 years    Types: Cigarettes  . Smokeless tobacco: Never Used  . Alcohol Use: No   OB History   Grav Para Term Preterm Abortions TAB SAB Ect Mult Living   6 3 2 1 3  3   3      Review of Systems  Constitutional: Negative for fever and weight loss.  Cardiovascular: Negative for chest pain.  Gastrointestinal: Negative for abdominal pain and bowel incontinence.  Genitourinary: Negative for bladder incontinence, dysuria and pelvic pain.  Musculoskeletal: Positive for back pain.  Neurological: Negative for tingling, weakness, numbness, headaches and paresthesias.  All other systems reviewed and are negative.  Allergies  Morphine and related and Reglan  Home Medications   Prior to Admission medications   Medication Sig Start Date End Date Taking? Authorizing Provider  Aspirin-Acetaminophen-Caffeine (GOODY HEADACHE PO) Take 1 packet by mouth daily as needed (aches/pain).    Historical Provider, MD  busPIRone (BUSPAR) 10 MG tablet Take 10 mg by mouth 3 (three) times daily.    Historical Provider, MD  busPIRone (BUSPAR) 5 MG tablet Take 5 mg by mouth 2 (two) times  daily.    Historical Provider, MD  cetirizine (ZYRTEC) 10 MG tablet Take 10 mg by mouth daily.    Historical Provider, MD  desvenlafaxine (PRISTIQ) 100 MG 24 hr tablet Take 100 mg by mouth daily.    Historical Provider, MD  desvenlafaxine (PRISTIQ) 50 MG 24 hr tablet Take 50 mg by mouth daily.    Historical Provider, MD  gabapentin (NEURONTIN) 600 MG tablet Take 600 mg by mouth 3 (three) times daily.    Historical Provider, MD  HYDROcodone-acetaminophen (NORCO/VICODIN) 5-325 MG per tablet Take 1 tablet by mouth every 6 (six) hours as  needed for moderate pain.    Historical Provider, MD  hydrOXYzine (ATARAX/VISTARIL) 25 MG tablet Take 50 mg by mouth at bedtime.    Historical Provider, MD  loperamide (IMODIUM) 2 MG capsule Take 1 capsule (2 mg total) by mouth 4 (four) times daily as needed for diarrhea or loose stools. 11/28/12   Willodean Rosenthal, MD  meloxicam (MOBIC) 7.5 MG tablet Take 1 tablet (7.5 mg total) by mouth daily. 03/02/13   Adilson Grafton K Hareem Surowiec-Rasch, MD  metFORMIN (GLUCOPHAGE) 500 MG tablet Take 250 mg by mouth daily after supper.    Historical Provider, MD  oxyCODONE-acetaminophen (PERCOCET/ROXICET) 5-325 MG per tablet Take 1-2 tablets by mouth every 6 (six) hours as needed for pain. 10/08/12   Willodean Rosenthal, MD  QUEtiapine (SEROQUEL) 25 MG tablet Take 25 mg by mouth at bedtime.    Historical Provider, MD  Rivaroxaban (XARELTO) 20 MG TABS tablet Take 1 tablet (20 mg total) by mouth daily. Take for 3 months, after the 15 mg bid for 3 wks, take with food 10/30/12   Reva Bores, MD  tiZANidine (ZANAFLEX) 4 MG tablet Take 4 mg by mouth every 6 (six) hours as needed (muscle spasms).    Historical Provider, MD  topiramate (TOPAMAX) 25 MG tablet Take 75 mg by mouth 2 (two) times daily.     Historical Provider, MD  traMADol (ULTRAM) 50 MG tablet Take 1 tablet (50 mg total) by mouth every 6 (six) hours as needed. 02/18/13   Geoffery Lyons, MD   Triage Vitals: BP 110/68  Pulse 84  Temp(Src) 97.9 F (36.6 C) (Oral)  Resp 16  Ht 5\' 1"  (1.549 m)  Wt 130 lb (58.968 kg)  BMI 24.58 kg/m2  SpO2 100%  LMP 10/02/2012 Physical Exam  Nursing note and vitals reviewed. Constitutional: She is oriented to person, place, and time. She appears well-developed and well-nourished.  HENT:  Head: Normocephalic and atraumatic.  Mouth/Throat: Mucous membranes are normal.  Eyes: Conjunctivae and EOM are normal. Pupils are equal, round, and reactive to light.  Neck: Normal range of motion. Neck supple.  Cardiovascular: Normal rate,  regular rhythm and intact distal pulses.   Pulmonary/Chest: Effort normal and breath sounds normal. She has no wheezes. She has no rales.  Abdominal: Soft. Bowel sounds are normal. There is no tenderness. There is no rebound and no guarding.  Musculoskeletal: Normal range of motion.  Neurological: She is alert and oriented to person, place, and time. She has normal reflexes.  Perineal sensation intact  L5 S1 intact   Skin: Skin is warm and dry.  Psychiatric: She has a normal mood and affect. Her behavior is normal.   ED Course  Procedures (including critical care time) DIAGNOSTIC STUDIES: Oxygen Saturation is 100% on RA, normal by my interpretation.    COORDINATION OF CARE: 12:04 AM-Discussed treatment plan which includes medication for pain with pt at bedside and  pt agreed to plan.   Labs Review Labs Reviewed - No data to display  Imaging Review No results found.   EKG Interpretation None      MDM   Final diagnoses:  None    Chronic back pain, will prescribe mobic.  Follow up with your PMD  I personally performed the services described in this documentation, which was scribed in my presence. The recorded information has been reviewed and is accurate.    Jasmine AweApril K Quiana Cobaugh-Rasch, MD 10/21/13 [id number]

## 2013-10-20 NOTE — ED Notes (Addendum)
Pt was seen Wedns morning and given pain pills and muscle relaxers for back pain. Not helping. Lower back. Denies incontinence.
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
## 2013-10-21 ENCOUNTER — Encounter (HOSPITAL_BASED_OUTPATIENT_CLINIC_OR_DEPARTMENT_OTHER): Payer: Self-pay | Admitting: Emergency Medicine

## 2013-10-21 MED ORDER — MELOXICAM 7.5 MG PO TABS: 7.5000 mg | ORAL_TABLET | Freq: Every day | ORAL | Status: DC

## 2013-10-21 MED ORDER — DEXAMETHASONE SODIUM PHOSPHATE 4 MG/ML IJ SOLN
INTRAMUSCULAR | Status: AC
  Filled 2013-10-21: qty 3

## 2013-10-21 MED ORDER — DEXAMETHASONE SODIUM PHOSPHATE 10 MG/ML IJ SOLN
10.0000 mg | Freq: Once | INTRAMUSCULAR | Status: AC
  Administered 2013-10-21: 10 mg via INTRAMUSCULAR

## 2013-10-21 MED ORDER — KETOROLAC TROMETHAMINE 60 MG/2ML IM SOLN
60.0000 mg | Freq: Once | INTRAMUSCULAR | Status: AC
  Administered 2013-10-21: 60 mg via INTRAMUSCULAR
  Filled 2013-10-21: qty 2

## 2013-10-21 NOTE — Discharge Instructions (Signed)
Back Exercises These exercises may help you when beginning to rehabilitate your injury. Your symptoms may resolve with or without further involvement from your physician, physical therapist or athletic trainer. While completing these exercises, remember:   Restoring tissue flexibility helps normal motion to return to the joints. This allows healthier, less painful movement and activity.  An effective stretch should be held for at least 30 seconds.  A stretch should never be painful. You should only feel a gentle lengthening or release in the stretched tissue. STRETCH - Extension, Prone on Elbows   Lie on your stomach on the floor, a bed will be too soft. Place your palms about shoulder width apart and at the height of your head.  Place your elbows under your shoulders. If this is too painful, stack pillows under your chest.  Allow your body to relax so that your hips drop lower and make contact more completely with the floor.  Hold this position for __________ seconds.  Slowly return to lying flat on the floor. Repeat __________ times. Complete this exercise __________ times per day.  RANGE OF MOTION - Extension, Prone Press Ups   Lie on your stomach on the floor, a bed will be too soft. Place your palms about shoulder width apart and at the height of your head.  Keeping your back as relaxed as possible, slowly straighten your elbows while keeping your hips on the floor. You may adjust the placement of your hands to maximize your comfort. As you gain motion, your hands will come more underneath your shoulders.  Hold this position __________ seconds.  Slowly return to lying flat on the floor. Repeat __________ times. Complete this exercise __________ times per day.  RANGE OF MOTION- Quadruped, Neutral Spine   Assume a hands and knees position on a firm surface. Keep your hands under your shoulders and your knees under your hips. You may place padding under your knees for  comfort.  Drop your head and point your tail bone toward the ground below you. This will round out your low back like an angry cat. Hold this position for __________ seconds.  Slowly lift your head and release your tail bone so that your back sags into a large arch, like an old horse.  Hold this position for __________ seconds.  Repeat this until you feel limber in your low back.  Now, find your "sweet spot." This will be the most comfortable position somewhere between the two previous positions. This is your neutral spine. Once you have found this position, tense your stomach muscles to support your low back.  Hold this position for __________ seconds. Repeat __________ times. Complete this exercise __________ times per day.  STRETCH - Flexion, Single Knee to Chest   Lie on a firm bed or floor with both legs extended in front of you.  Keeping one leg in contact with the floor, bring your opposite knee to your chest. Hold your leg in place by either grabbing behind your thigh or at your knee.  Pull until you feel a gentle stretch in your low back. Hold __________ seconds.  Slowly release your grasp and repeat the exercise with the opposite side. Repeat __________ times. Complete this exercise __________ times per day.  STRETCH - Hamstrings, Standing  Stand or sit and extend your right / left leg, placing your foot on a chair or foot stool  Keeping a slight arch in your low back and your hips straight forward.  Lead with your chest and   lean forward at the waist until you feel a gentle stretch in the back of your right / left knee or thigh. (When done correctly, this exercise requires leaning only a small distance.)  Hold this position for __________ seconds. Repeat __________ times. Complete this stretch __________ times per day. STRENGTHENING - Deep Abdominals, Pelvic Tilt   Lie on a firm bed or floor. Keeping your legs in front of you, bend your knees so they are both pointed  toward the ceiling and your feet are flat on the floor.  Tense your lower abdominal muscles to press your low back into the floor. This motion will rotate your pelvis so that your tail bone is scooping upwards rather than pointing at your feet or into the floor.  With a gentle tension and even breathing, hold this position for __________ seconds. Repeat __________ times. Complete this exercise __________ times per day.  STRENGTHENING - Abdominals, Crunches   Lie on a firm bed or floor. Keeping your legs in front of you, bend your knees so they are both pointed toward the ceiling and your feet are flat on the floor. Cross your arms over your chest.  Slightly tip your chin down without bending your neck.  Tense your abdominals and slowly lift your trunk high enough to just clear your shoulder blades. Lifting higher can put excessive stress on the low back and does not further strengthen your abdominal muscles.  Control your return to the starting position. Repeat __________ times. Complete this exercise __________ times per day.  STRENGTHENING - Quadruped, Opposite UE/LE Lift   Assume a hands and knees position on a firm surface. Keep your hands under your shoulders and your knees under your hips. You may place padding under your knees for comfort.  Find your neutral spine and gently tense your abdominal muscles so that you can maintain this position. Your shoulders and hips should form a rectangle that is parallel with the floor and is not twisted.  Keeping your trunk steady, lift your right hand no higher than your shoulder and then your left leg no higher than your hip. Make sure you are not holding your breath. Hold this position __________ seconds.  Continuing to keep your abdominal muscles tense and your back steady, slowly return to your starting position. Repeat with the opposite arm and leg. Repeat __________ times. Complete this exercise __________ times per day. Document Released:  03/10/2005 Document Revised: 05/15/2011 Document Reviewed: 06/04/2008 [employer] Patient Information 2015 [employer]. This information is not intended to replace advice given to you by your health care provider. Make sure you discuss any questions you have with your health care provider.  

## 2014-01-05 ENCOUNTER — Encounter (HOSPITAL_BASED_OUTPATIENT_CLINIC_OR_DEPARTMENT_OTHER): Payer: Self-pay | Admitting: Emergency Medicine

## 2014-03-31 ENCOUNTER — Emergency Department (HOSPITAL_BASED_OUTPATIENT_CLINIC_OR_DEPARTMENT_OTHER)
Admission: EM | Admit: 2014-03-31 | Discharge: 2014-04-01 | Disposition: A | Discharge status: Home / Self Care | Payer: Medicaid Other | Attending: Emergency Medicine | Admitting: Emergency Medicine

## 2014-03-31 ENCOUNTER — Encounter (HOSPITAL_BASED_OUTPATIENT_CLINIC_OR_DEPARTMENT_OTHER): Payer: Self-pay | Admitting: *Deleted

## 2014-03-31 DIAGNOSIS — Z8742 Personal history of other diseases of the female genital tract: Secondary | ICD-10-CM | POA: Diagnosis not present

## 2014-03-31 DIAGNOSIS — K644 Residual hemorrhoidal skin tags: Secondary | ICD-10-CM | POA: Insufficient documentation

## 2014-03-31 DIAGNOSIS — J45909 Unspecified asthma, uncomplicated: Secondary | ICD-10-CM | POA: Insufficient documentation

## 2014-03-31 DIAGNOSIS — G43909 Migraine, unspecified, not intractable, without status migrainosus: Secondary | ICD-10-CM | POA: Insufficient documentation

## 2014-03-31 DIAGNOSIS — M797 Fibromyalgia: Secondary | ICD-10-CM | POA: Diagnosis not present

## 2014-03-31 DIAGNOSIS — F319 Bipolar disorder, unspecified: Secondary | ICD-10-CM | POA: Diagnosis not present

## 2014-03-31 DIAGNOSIS — Z79899 Other long term (current) drug therapy: Secondary | ICD-10-CM | POA: Diagnosis not present

## 2014-03-31 DIAGNOSIS — Z7901 Long term (current) use of anticoagulants: Secondary | ICD-10-CM | POA: Insufficient documentation

## 2014-03-31 DIAGNOSIS — G2581 Restless legs syndrome: Secondary | ICD-10-CM | POA: Insufficient documentation

## 2014-03-31 DIAGNOSIS — F419 Anxiety disorder, unspecified: Secondary | ICD-10-CM | POA: Insufficient documentation

## 2014-03-31 DIAGNOSIS — Z791 Long term (current) use of non-steroidal anti-inflammatories (NSAID): Secondary | ICD-10-CM | POA: Insufficient documentation

## 2014-03-31 DIAGNOSIS — Z720 Tobacco use: Secondary | ICD-10-CM | POA: Insufficient documentation

## 2014-03-31 DIAGNOSIS — E119 Type 2 diabetes mellitus without complications: Secondary | ICD-10-CM | POA: Insufficient documentation

## 2014-03-31 DIAGNOSIS — K6289 Other specified diseases of anus and rectum: Secondary | ICD-10-CM | POA: Diagnosis present

## 2014-03-31 MED ORDER — LIDOCAINE HCL 2 % EX GEL
CUTANEOUS | Status: AC
  Administered 2014-04-01
  Filled 2014-03-31: qty 20

## 2014-03-31 NOTE — ED Notes (Signed)
Pt c/o rectal pain ? Hemorids x 1 day

## 2014-03-31 NOTE — ED Provider Notes (Signed)
CSN: [id number]     Arrival date & time 03/31/14  2216 History   This chart was scribed for Hanley SeamenJohn L Terea Neubauer, MD by Abel PrestoKara Demonbreun, ED Scribe. This patient was seen in room MH11/MH11 and the patient's care was started at 11:43 PM.    Chief Complaint  Patient presents with  . Rectal Pain    HPI HPI Comments: Tiffany Whitney is a 34 y.o. female who presents to the Emergency Department complaining of rectal pain with onset today 30 min PTA.  She states she noticed discomfort sitting initially, then palpated two tender anal nodules while in the shower. Pt notes associated soreness, burning, and swelling of rectum which is severe at its worst. Pt states she has had internal hemorrhoids in the past. Pt denies diarrhea, constipation, nausea, vomiting, fever, and chills.   Past Medical History  Diagnosis Date  . Migraine   . Depression   . Bipolar 1 disorder   . Otitis media   . Strep throat   . Fibromyalgia 04/2012  . Restless leg syndrome   . Menorrhagia   . Anxiety   . Asthma     as a child- no inhaler  . Diabetes mellitus without complication    Past Surgical History  Procedure Laterality Date  . Tonsillectomy    . Cholecystectomy    . Tubal ligation    . Tympanostomy    . Laparoscopic assisted vaginal hysterectomy N/A 10/08/2012    Procedure: LAPAROSCOPIC ASSISTED VAGINAL HYSTERECTOMY;  Surgeon: Willodean Rosenthalarolyn Harraway-Smith, MD;  Location: [facility];  Service: Gynecology;  Laterality: N/A;  . Bilateral salpingectomy Bilateral 10/08/2012    Procedure: BILATERAL SALPINGECTOMY;  Surgeon: Willodean Rosenthalarolyn Harraway-Smith, MD;  Location: [facility];  Service: Gynecology;  Laterality: Bilateral;   Family History  Problem Relation Age of Onset  . Depression Mother    History  Substance Use Topics  . Smoking status: Current Every Day Smoker -- 1.00 packs/day for 15 years    Types: Cigarettes  . Smokeless tobacco: Never Used  . Alcohol Use: No   OB History    Gravida Para Term Preterm AB TAB SAB Ectopic  Multiple Living   6 3 2 1 3  3   3      Review of Systems A complete 10 system review of systems was obtained and all systems are negative except as noted in the HPI and PMH.     Allergies  Morphine and related and Reglan  Home Medications   Prior to Admission medications   Medication Sig Start Date End Date Taking? Authorizing Provider  Aspirin-Acetaminophen-Caffeine (GOODY HEADACHE PO) Take 1 packet by mouth daily as needed (aches/pain).    Historical Provider, MD  busPIRone (BUSPAR) 10 MG tablet Take 10 mg by mouth 3 (three) times daily.    Historical Provider, MD  busPIRone (BUSPAR) 5 MG tablet Take 5 mg by mouth 2 (two) times daily.    Historical Provider, MD  cetirizine (ZYRTEC) 10 MG tablet Take 10 mg by mouth daily.    Historical Provider, MD  desvenlafaxine (PRISTIQ) 100 MG 24 hr tablet Take 100 mg by mouth daily.    Historical Provider, MD  desvenlafaxine (PRISTIQ) 50 MG 24 hr tablet Take 50 mg by mouth daily.    Historical Provider, MD  gabapentin (NEURONTIN) 600 MG tablet Take 600 mg by mouth 3 (three) times daily.    Historical Provider, MD  HYDROcodone-acetaminophen (NORCO/VICODIN) 5-325 MG per tablet Take 1 tablet by mouth every 6 (six) hours as needed for moderate  pain.    Historical Provider, MD  hydrOXYzine (ATARAX/VISTARIL) 25 MG tablet Take 50 mg by mouth at bedtime.    Historical Provider, MD  loperamide (IMODIUM) 2 MG capsule Take 1 capsule (2 mg total) by mouth 4 (four) times daily as needed for diarrhea or loose stools. 11/28/12   Willodean Rosenthal, MD  meloxicam (MOBIC) 7.5 MG tablet Take 1 tablet (7.5 mg total) by mouth daily. 03/02/13   April K Palumbo-Rasch, MD  meloxicam (MOBIC) 7.5 MG tablet Take 1 tablet (7.5 mg total) by mouth daily. 10/21/13   April K Palumbo-Rasch, MD  metFORMIN (GLUCOPHAGE) 500 MG tablet Take 250 mg by mouth daily after supper.    Historical Provider, MD  oxyCODONE-acetaminophen (PERCOCET/ROXICET) 5-325 MG per tablet Take 1-2 tablets  by mouth every 6 (six) hours as needed for pain. 10/08/12   Willodean Rosenthal, MD  QUEtiapine (SEROQUEL) 25 MG tablet Take 25 mg by mouth at bedtime.    Historical Provider, MD  Rivaroxaban (XARELTO) 20 MG TABS tablet Take 1 tablet (20 mg total) by mouth daily. Take for 3 months, after the 15 mg bid for 3 wks, take with food 10/30/12   Reva Bores, MD  tiZANidine (ZANAFLEX) 4 MG tablet Take 4 mg by mouth every 6 (six) hours as needed (muscle spasms).    Historical Provider, MD  topiramate (TOPAMAX) 25 MG tablet Take 75 mg by mouth 2 (two) times daily.     Historical Provider, MD  traMADol (ULTRAM) 50 MG tablet Take 1 tablet (50 mg total) by mouth every 6 (six) hours as needed. 02/18/13   Geoffery Lyons, MD   BP 117/82 mmHg  Pulse 87  Temp(Src) 97.9 F (36.6 C) (Oral)  Resp 18  Wt 119 lb 4.8 oz (54.114 kg)  SpO2 100%  LMP 10/02/2012   Physical Exam  Nursing note and vitals reviewed. General: Well-developed, well-nourished female in no acute distress; appearance consistent with age of record HENT: normocephalic; atraumatic; edentulous Eyes: pupils equal, round and reactive to light; extraocular muscles intact Neck: supple Heart: regular rate and rhythm Lungs: clear to auscultation bilaterally Abdomen: soft; nondistended; nontender; no masses or hepatosplenomegaly; bowel sounds present Rectal: Tender, reducible external hemorrhoids at 8 o'clock and 10 o'clock without thrombosis Extremities: No deformity; full range of motion; pulses normal Neurologic: Awake, alert and oriented; motor function intact in all extremities and symmetric; no facial droop Skin: Warm and dry Psychiatric: Normal mood and affect   ED Course  Procedures (including critical care time) DIAGNOSTIC STUDIES: Oxygen Saturation is 100% on room air, normal by my interpretation.    COORDINATION OF CARE: 11:48 PM Discussed treatment plan with patient at beside, the patient agrees with the plan and has no further  questions at this time.   MDM  I personally performed the services described in this documentation, which was scribed in my presence. The recorded information has been reviewed and is accurate.   Hanley Seamen, MD 04/01/14 0025

## 2014-04-01 MED ORDER — HYDROCORTISONE 2.5 % EX CREA: TOPICAL_CREAM | CUTANEOUS | Status: DC

## 2014-04-01 MED ORDER — LIDOCAINE 5 % EX OINT: TOPICAL_OINTMENT | CUTANEOUS | Status: DC

## 2014-04-01 NOTE — Discharge Instructions (Signed)

## 2014-04-29 ENCOUNTER — Encounter: Payer: Self-pay | Admitting: *Deleted

## 2014-05-03 ENCOUNTER — Encounter (HOSPITAL_BASED_OUTPATIENT_CLINIC_OR_DEPARTMENT_OTHER): Payer: Self-pay | Admitting: Emergency Medicine

## 2014-05-03 DIAGNOSIS — J45909 Unspecified asthma, uncomplicated: Secondary | ICD-10-CM | POA: Diagnosis not present

## 2014-05-03 DIAGNOSIS — G8929 Other chronic pain: Secondary | ICD-10-CM | POA: Insufficient documentation

## 2014-05-03 DIAGNOSIS — F419 Anxiety disorder, unspecified: Secondary | ICD-10-CM | POA: Diagnosis not present

## 2014-05-03 DIAGNOSIS — F329 Major depressive disorder, single episode, unspecified: Secondary | ICD-10-CM | POA: Diagnosis not present

## 2014-05-03 DIAGNOSIS — Z8669 Personal history of other diseases of the nervous system and sense organs: Secondary | ICD-10-CM | POA: Insufficient documentation

## 2014-05-03 DIAGNOSIS — Z7952 Long term (current) use of systemic steroids: Secondary | ICD-10-CM | POA: Diagnosis not present

## 2014-05-03 DIAGNOSIS — Z7901 Long term (current) use of anticoagulants: Secondary | ICD-10-CM | POA: Insufficient documentation

## 2014-05-03 DIAGNOSIS — E119 Type 2 diabetes mellitus without complications: Secondary | ICD-10-CM | POA: Diagnosis not present

## 2014-05-03 DIAGNOSIS — M545 Low back pain: Secondary | ICD-10-CM | POA: Insufficient documentation

## 2014-05-03 DIAGNOSIS — Z720 Tobacco use: Secondary | ICD-10-CM | POA: Diagnosis not present

## 2014-05-03 DIAGNOSIS — Z791 Long term (current) use of non-steroidal anti-inflammatories (NSAID): Secondary | ICD-10-CM | POA: Insufficient documentation

## 2014-05-03 DIAGNOSIS — Z79899 Other long term (current) drug therapy: Secondary | ICD-10-CM | POA: Diagnosis not present

## 2014-05-03 DIAGNOSIS — Z87448 Personal history of other diseases of urinary system: Secondary | ICD-10-CM | POA: Insufficient documentation

## 2014-05-03 NOTE — ED Notes (Signed)
Pt reports hx of back pain current medications ineffective PMD sent pt to ED

## 2014-05-04 ENCOUNTER — Emergency Department (HOSPITAL_BASED_OUTPATIENT_CLINIC_OR_DEPARTMENT_OTHER)
Admission: EM | Admit: 2014-05-04 | Discharge: 2014-05-04 | Disposition: A | Discharge status: Home / Self Care | Payer: Medicaid Other | Attending: Emergency Medicine | Admitting: Emergency Medicine

## 2014-05-04 ENCOUNTER — Encounter (HOSPITAL_BASED_OUTPATIENT_CLINIC_OR_DEPARTMENT_OTHER): Payer: Self-pay | Admitting: Emergency Medicine

## 2014-05-04 DIAGNOSIS — G8929 Other chronic pain: Secondary | ICD-10-CM

## 2014-05-04 DIAGNOSIS — M549 Dorsalgia, unspecified: Secondary | ICD-10-CM

## 2014-05-04 MED ORDER — PREDNISONE 20 MG PO TABS: ORAL_TABLET | ORAL | Status: DC

## 2014-05-04 MED ORDER — DEXAMETHASONE SODIUM PHOSPHATE 10 MG/ML IJ SOLN
10.0000 mg | Freq: Once | INTRAMUSCULAR | Status: AC
  Administered 2014-05-04: 10 mg via INTRAMUSCULAR
  Filled 2014-05-04: qty 1

## 2014-05-04 MED ORDER — KETOROLAC TROMETHAMINE 60 MG/2ML IM SOLN
60.0000 mg | Freq: Once | INTRAMUSCULAR | Status: AC
  Administered 2014-05-04: 60 mg via INTRAMUSCULAR
  Filled 2014-05-04: qty 2

## 2014-05-04 MED ORDER — METHOCARBAMOL 500 MG PO TABS
1000.0000 mg | ORAL_TABLET | Freq: Once | ORAL | Status: AC
  Administered 2014-05-04: 1000 mg via ORAL
  Filled 2014-05-04: qty 2

## 2014-05-04 MED ORDER — DICLOFENAC EPOLAMINE 1.3 % TD PTCH: 1.0000 | MEDICATED_PATCH | Freq: Two times a day (BID) | TRANSDERMAL | Status: DC

## 2014-05-04 NOTE — ED Provider Notes (Signed)
CSN: [id number]     Arrival date & time 05/03/14  2152 History   First MD Initiated Contact with Patient 05/04/14 0020     Chief Complaint  Patient presents with  . Back Pain     (Consider location/radiation/quality/duration/timing/severity/associated sxs/prior Treatment) Patient is a 34 y.o. female presenting with back pain. The history is provided by the patient.  Back Pain Location:  Sacro-iliac joint Quality:  Stabbing Radiates to:  Does not radiate Pain severity:  Severe Pain is:  Same all the time Onset quality:  Gradual Timing:  Constant Progression:  Waxing and waning Chronicity:  Chronic Context: not MCA and not MVA   Relieved by:  Nothing Worsened by:  Nothing tried Ineffective treatments:  None tried Associated symptoms: no abdominal pain, no abdominal swelling, no bladder incontinence, no bowel incontinence, no chest pain, no dysuria, no fever, no headaches, no leg pain, no numbness, no paresthesias, no perianal numbness, no tingling, no weakness and no weight loss   Risk factors: no hx of cancer   told by her pain manager to come to the ED as zanaflex and valium is not working this week  Past Medical History  Diagnosis Date  . Migraine   . Depression   . Bipolar 1 disorder   . Otitis media   . Strep throat   . Fibromyalgia 04/2012  . Restless leg syndrome   . Menorrhagia   . Anxiety   . Asthma     as a child- no inhaler  . Diabetes mellitus without complication    Past Surgical History  Procedure Laterality Date  . Tonsillectomy    . Cholecystectomy    . Tubal ligation    . Tympanostomy    . Laparoscopic assisted vaginal hysterectomy N/A 10/08/2012    Procedure: LAPAROSCOPIC ASSISTED VAGINAL HYSTERECTOMY;  Surgeon: Willodean Rosenthalarolyn Harraway-Smith, MD;  Location: [facility];  Service: Gynecology;  Laterality: N/A;  . Bilateral salpingectomy Bilateral 10/08/2012    Procedure: BILATERAL SALPINGECTOMY;  Surgeon: Willodean Rosenthalarolyn Harraway-Smith, MD;  Location: [facility];  Service:  Gynecology;  Laterality: Bilateral;   Family History  Problem Relation Age of Onset  . Depression Mother    History  Substance Use Topics  . Smoking status: Current Every Day Smoker -- 1.00 packs/day for 15 years    Types: Cigarettes  . Smokeless tobacco: Never Used  . Alcohol Use: No   OB History    Gravida Para Term Preterm AB TAB SAB Ectopic Multiple Living   6 3 2 1 3  3   3      Review of Systems  Constitutional: Negative for fever and weight loss.  Cardiovascular: Negative for chest pain.  Gastrointestinal: Negative for abdominal pain and bowel incontinence.  Genitourinary: Negative for bladder incontinence and dysuria.  Musculoskeletal: Positive for back pain.  Neurological: Negative for tingling, weakness, numbness, headaches and paresthesias.  All other systems reviewed and are negative.     Allergies  Morphine and related and Reglan  Home Medications   Prior to Admission medications   Medication Sig Start Date End Date Taking? Authorizing Provider  Aspirin-Acetaminophen-Caffeine (GOODY HEADACHE PO) Take 1 packet by mouth daily as needed (aches/pain).    Historical Provider, MD  busPIRone (BUSPAR) 10 MG tablet Take 10 mg by mouth 3 (three) times daily.    Historical Provider, MD  busPIRone (BUSPAR) 5 MG tablet Take 5 mg by mouth 2 (two) times daily.    Historical Provider, MD  cetirizine (ZYRTEC) 10 MG tablet Take 10 mg by  mouth daily.    Historical Provider, MD  desvenlafaxine (PRISTIQ) 100 MG 24 hr tablet Take 100 mg by mouth daily.    Historical Provider, MD  desvenlafaxine (PRISTIQ) 50 MG 24 hr tablet Take 50 mg by mouth daily.    Historical Provider, MD  diclofenac (FLECTOR) 1.3 % PTCH Place 1 patch onto the skin 2 (two) times daily. 05/04/14   Jermone Geister K Donyell Carrell-Rasch, MD  gabapentin (NEURONTIN) 600 MG tablet Take 600 mg by mouth 3 (three) times daily.    Historical Provider, MD  HYDROcodone-acetaminophen (NORCO/VICODIN) 5-325 MG per tablet Take 1 tablet by  mouth every 6 (six) hours as needed for moderate pain.    Historical Provider, MD  hydrocortisone 2.5 % cream Apply to hemorrhoids twice daily for one week. 04/01/14   Carlisle Beers Molpus, MD  hydrOXYzine (ATARAX/VISTARIL) 25 MG tablet Take 50 mg by mouth at bedtime.    Historical Provider, MD  lidocaine (XYLOCAINE) 5 % ointment Apply to hemorrhoids as needed for pain 04/01/14   Carlisle Beers Molpus, MD  loperamide (IMODIUM) 2 MG capsule Take 1 capsule (2 mg total) by mouth 4 (four) times daily as needed for diarrhea or loose stools. 11/28/12   Willodean Rosenthal, MD  meloxicam (MOBIC) 7.5 MG tablet Take 1 tablet (7.5 mg total) by mouth daily. 03/02/13   Weltha Cathy K Jamison Soward-Rasch, MD  meloxicam (MOBIC) 7.5 MG tablet Take 1 tablet (7.5 mg total) by mouth daily. 10/21/13   Sheanna Dail K Tresten Pantoja-Rasch, MD  metFORMIN (GLUCOPHAGE) 500 MG tablet Take 250 mg by mouth daily after supper.    Historical Provider, MD  oxyCODONE-acetaminophen (PERCOCET/ROXICET) 5-325 MG per tablet Take 1-2 tablets by mouth every 6 (six) hours as needed for pain. 10/08/12   Willodean Rosenthal, MD  predniSONE (DELTASONE) 20 MG tablet 3 tabs po day one, then 2 po daily x 4 days 05/04/14   Gitel Beste K Roda Lauture-Rasch, MD  QUEtiapine (SEROQUEL) 25 MG tablet Take 25 mg by mouth at bedtime.    Historical Provider, MD  Rivaroxaban (XARELTO) 20 MG TABS tablet Take 1 tablet (20 mg total) by mouth daily. Take for 3 months, after the 15 mg bid for 3 wks, take with food 10/30/12   Reva Bores, MD  tiZANidine (ZANAFLEX) 4 MG tablet Take 4 mg by mouth every 6 (six) hours as needed (muscle spasms).    Historical Provider, MD  topiramate (TOPAMAX) 25 MG tablet Take 75 mg by mouth 2 (two) times daily.     Historical Provider, MD  traMADol (ULTRAM) 50 MG tablet Take 1 tablet (50 mg total) by mouth every 6 (six) hours as needed. 02/18/13   Geoffery Lyons, MD   BP 128/78 mmHg  Pulse 88  Temp(Src) 98 F (36.7 C) (Oral)  Resp 18  SpO2 99%  LMP 10/02/2012 Physical Exam   Constitutional: She is oriented to person, place, and time. She appears well-developed and well-nourished. No distress.  HENT:  Head: Normocephalic and atraumatic.  Mouth/Throat: Oropharynx is clear and moist.  Eyes: Conjunctivae are normal. Pupils are equal, round, and reactive to light.  Neck: Normal range of motion. Neck supple.  Cardiovascular: Normal rate, regular rhythm and intact distal pulses.   Pulmonary/Chest: Effort normal and breath sounds normal. No respiratory distress. She has no wheezes. She has no rales.  Abdominal: Soft. Bowel sounds are normal. There is no tenderness. There is no rebound and no guarding.  Musculoskeletal: Normal range of motion.  Intact gait and intact L5/s1   Neurological: She is alert  and oriented to person, place, and time. She has normal reflexes.  Skin: Skin is warm and dry.  Psychiatric: She has a normal mood and affect.    ED Course  Procedures (including critical care time) Labs Review Labs Reviewed - No data to display  Imaging Review No results found.   EKG Interpretation None      MDM   Final diagnoses:  Chronic back pain greater than 3 months duration    NSAIDs and steroids and close follow up with your pain management team.      Efrain Clauson K Bentli Llorente-Rasch, MD 05/04/14 [phone]

## 2014-06-26 ENCOUNTER — Emergency Department (HOSPITAL_BASED_OUTPATIENT_CLINIC_OR_DEPARTMENT_OTHER): Payer: Medicaid Other

## 2014-06-26 ENCOUNTER — Emergency Department (HOSPITAL_BASED_OUTPATIENT_CLINIC_OR_DEPARTMENT_OTHER)
Admission: EM | Admit: 2014-06-26 | Discharge: 2014-06-26 | Disposition: A | Discharge status: Home / Self Care | Payer: Medicaid Other | Attending: Emergency Medicine | Admitting: Emergency Medicine

## 2014-06-26 ENCOUNTER — Encounter (HOSPITAL_BASED_OUTPATIENT_CLINIC_OR_DEPARTMENT_OTHER): Payer: Self-pay

## 2014-06-26 DIAGNOSIS — Z79899 Other long term (current) drug therapy: Secondary | ICD-10-CM | POA: Diagnosis not present

## 2014-06-26 DIAGNOSIS — N8320 Unspecified ovarian cysts: Secondary | ICD-10-CM | POA: Insufficient documentation

## 2014-06-26 DIAGNOSIS — F419 Anxiety disorder, unspecified: Secondary | ICD-10-CM | POA: Insufficient documentation

## 2014-06-26 DIAGNOSIS — Z720 Tobacco use: Secondary | ICD-10-CM | POA: Insufficient documentation

## 2014-06-26 DIAGNOSIS — G43909 Migraine, unspecified, not intractable, without status migrainosus: Secondary | ICD-10-CM | POA: Diagnosis not present

## 2014-06-26 DIAGNOSIS — G2581 Restless legs syndrome: Secondary | ICD-10-CM | POA: Insufficient documentation

## 2014-06-26 DIAGNOSIS — Z791 Long term (current) use of non-steroidal anti-inflammatories (NSAID): Secondary | ICD-10-CM | POA: Diagnosis not present

## 2014-06-26 DIAGNOSIS — Z7952 Long term (current) use of systemic steroids: Secondary | ICD-10-CM | POA: Insufficient documentation

## 2014-06-26 DIAGNOSIS — F319 Bipolar disorder, unspecified: Secondary | ICD-10-CM | POA: Diagnosis not present

## 2014-06-26 DIAGNOSIS — M797 Fibromyalgia: Secondary | ICD-10-CM | POA: Insufficient documentation

## 2014-06-26 DIAGNOSIS — Z7901 Long term (current) use of anticoagulants: Secondary | ICD-10-CM | POA: Diagnosis not present

## 2014-06-26 DIAGNOSIS — R52 Pain, unspecified: Secondary | ICD-10-CM

## 2014-06-26 DIAGNOSIS — R339 Retention of urine, unspecified: Secondary | ICD-10-CM | POA: Diagnosis present

## 2014-06-26 DIAGNOSIS — E119 Type 2 diabetes mellitus without complications: Secondary | ICD-10-CM | POA: Diagnosis not present

## 2014-06-26 DIAGNOSIS — J45909 Unspecified asthma, uncomplicated: Secondary | ICD-10-CM | POA: Insufficient documentation

## 2014-06-26 DIAGNOSIS — N83201 Unspecified ovarian cyst, right side: Secondary | ICD-10-CM

## 2014-06-26 LAB — URINALYSIS, ROUTINE W REFLEX MICROSCOPIC
BILIRUBIN URINE: NEGATIVE
Glucose, UA: NEGATIVE mg/dL
HGB URINE DIPSTICK: NEGATIVE
Ketones, ur: NEGATIVE mg/dL
Leukocytes, UA: NEGATIVE
NITRITE: NEGATIVE
PH: 5 (ref 5.0–8.0)
Protein, ur: NEGATIVE mg/dL
SPECIFIC GRAVITY, URINE: 1.043 — AB (ref 1.005–1.030)
UROBILINOGEN UA: 0.2 mg/dL (ref 0.0–1.0)

## 2014-06-26 LAB — CBC WITH DIFFERENTIAL/PLATELET
Basophils Absolute: 0.1 10*3/uL (ref 0.0–0.1)
Basophils Relative: 1 % (ref 0–1)
EOS ABS: 0.7 10*3/uL (ref 0.0–0.7)
Eosinophils Relative: 5 % (ref 0–5)
HCT: 41 % (ref 36.0–46.0)
HEMOGLOBIN: 14 g/dL (ref 12.0–15.0)
LYMPHS ABS: 4.9 10*3/uL — AB (ref 0.7–4.0)
LYMPHS PCT: 36 % (ref 12–46)
MCH: 31.7 pg (ref 26.0–34.0)
MCHC: 34.1 g/dL (ref 30.0–36.0)
MCV: 93 fL (ref 78.0–100.0)
MONOS PCT: 7 % (ref 3–12)
Monocytes Absolute: 0.9 10*3/uL (ref 0.1–1.0)
Neutro Abs: 6.9 10*3/uL (ref 1.7–7.7)
Neutrophils Relative %: 51 % (ref 43–77)
Platelets: 299 10*3/uL (ref 150–400)
RBC: 4.41 MIL/uL (ref 3.87–5.11)
RDW: 12.2 % (ref 11.5–15.5)
WBC: 13.4 10*3/uL — AB (ref 4.0–10.5)

## 2014-06-26 LAB — BASIC METABOLIC PANEL
Anion gap: 5 (ref 5–15)
BUN: 12 mg/dL (ref 6–23)
CO2: 23 mmol/L (ref 19–32)
Calcium: 9.2 mg/dL (ref 8.4–10.5)
Chloride: 114 mmol/L — ABNORMAL HIGH (ref 96–112)
Creatinine, Ser: 0.91 mg/dL (ref 0.50–1.10)
GFR calc non Af Amer: 81 mL/min — ABNORMAL LOW (ref >90)
Glucose, Bld: 95 mg/dL (ref 70–99)
POTASSIUM: 3.1 mmol/L — AB (ref 3.5–5.1)
SODIUM: 142 mmol/L (ref 135–145)

## 2014-06-26 MED ORDER — HYDROCODONE-ACETAMINOPHEN 5-325 MG PO TABS: 1.0000 | ORAL_TABLET | Freq: Four times a day (QID) | ORAL | Status: DC | PRN

## 2014-06-26 MED ORDER — NAPROXEN 500 MG PO TABS: 500.0000 mg | ORAL_TABLET | Freq: Two times a day (BID) | ORAL | Status: DC

## 2014-06-26 MED ORDER — FENTANYL CITRATE (PF) 100 MCG/2ML IJ SOLN
100.0000 ug | Freq: Once | INTRAMUSCULAR | Status: AC
  Administered 2014-06-26: 100 ug via INTRAVENOUS
  Filled 2014-06-26: qty 2

## 2014-06-26 MED ORDER — ONDANSETRON HCL 4 MG/2ML IJ SOLN
4.0000 mg | Freq: Once | INTRAMUSCULAR | Status: AC
  Administered 2014-06-26: 4 mg via INTRAVENOUS
  Filled 2014-06-26: qty 2

## 2014-06-26 MED ORDER — SODIUM CHLORIDE 0.9 % IV SOLN
INTRAVENOUS | Status: DC
  Administered 2014-06-26: 06:00:00 via INTRAVENOUS

## 2014-06-26 NOTE — ED Notes (Signed)
Patient transported to Ultrasound 

## 2014-06-26 NOTE — ED Notes (Signed)
Pt c/o urinary frequency with pain, unable to urinate a stream; states pain at supra pubic area x3hrs

## 2014-06-26 NOTE — Discharge Instructions (Signed)
Ultrasound shows a right, located ovarian cyst. This will require follow-up by GYN. In the meantime take the Naprosyn on a regular basis and the hydrocodone as needed for pain. Make an appointment to follow-up with GYN.

## 2014-06-26 NOTE — ED Provider Notes (Signed)
Results for orders placed or performed during the hospital encounter of 06/26/14  Urinalysis, Routine w reflex microscopic  Result Value Ref Range   Color, Urine YELLOW YELLOW   APPearance CLEAR CLEAR   Specific Gravity, Urine 1.043 (H) 1.005 - 1.030   pH 5.0 5.0 - 8.0   Glucose, UA NEGATIVE NEGATIVE mg/dL   Hgb urine dipstick NEGATIVE NEGATIVE   Bilirubin Urine NEGATIVE NEGATIVE   Ketones, ur NEGATIVE NEGATIVE mg/dL   Protein, ur NEGATIVE NEGATIVE mg/dL   Urobilinogen, UA 0.2 0.0 - 1.0 mg/dL   Nitrite NEGATIVE NEGATIVE   Leukocytes, UA NEGATIVE NEGATIVE  CBC with Differential/Platelet  Result Value Ref Range   WBC 13.4 (H) 4.0 - 10.5 K/uL   RBC 4.41 3.87 - 5.11 MIL/uL   Hemoglobin 14.0 12.0 - 15.0 g/dL   HCT 99.341.0 71.636.0 - 96.746.0 %   MCV 93.0 78.0 - 100.0 fL   MCH 31.7 26.0 - 34.0 pg   MCHC 34.1 30.0 - 36.0 g/dL   RDW 89.312.2 81.011.5 - 17.515.5 %   Platelets 299 150 - 400 K/uL   Neutrophils Relative % 51 43 - 77 %   Neutro Abs 6.9 1.7 - 7.7 K/uL   Lymphocytes Relative 36 12 - 46 %   Lymphs Abs 4.9 (H) 0.7 - 4.0 K/uL   Monocytes Relative 7 3 - 12 %   Monocytes Absolute 0.9 0.1 - 1.0 K/uL   Eosinophils Relative 5 0 - 5 %   Eosinophils Absolute 0.7 0.0 - 0.7 K/uL   Basophils Relative 1 0 - 1 %   Basophils Absolute 0.1 0.0 - 0.1 K/uL  Basic metabolic panel  Result Value Ref Range   Sodium 142 135 - 145 mmol/L   Potassium 3.1 (L) 3.5 - 5.1 mmol/L   Chloride 114 (H) 96 - 112 mmol/L   CO2 23 19 - 32 mmol/L   Glucose, Bld 95 70 - 99 mg/dL   BUN 12 6 - 23 mg/dL   Creatinine, Ser 1.020.91 0.50 - 1.10 mg/dL   Calcium 9.2 8.4 - 58.510.5 mg/dL   GFR calc non Af Amer 81 (L) >90 mL/min   GFR calc Af Amer >90 >90 mL/min   Anion gap 5 5 - 15   [place] Transvaginal Non-ob  06/26/2014   CLINICAL DATA:  Pelvic pain with difficulty urinating  EXAM: TRANSABDOMINAL AND TRANSVAGINAL ULTRASOUND OF PELVIS  TECHNIQUE: Both transabdominal and transvaginal ultrasound examinations of the pelvis were performed.  Transabdominal technique was performed for global imaging of the pelvis including uterus, ovaries, adnexal regions, and pelvic cul-de-sac. It was necessary to proceed with endovaginal exam following the transabdominal exam to visualize the adnexa.  COMPARISON:  None  FINDINGS: Uterus  Surgically removed.  Right ovary  Measurements: 3.7 x 1.7 x 2.3 cm. A 1.6 cm hypoechoic area is noted within the right ovary. This does not represent a simple cyst but may represent a mildly complicated cyst related to hemorrhage.  Left ovary  Measurements: 3.6 x 1.6 x 2.1 cm. A 6 mm ovarian cyst is noted.  Other findings  No free fluid.  IMPRESSION: Ovarian cystic changes.  Hypoechoic structure within the right ovary which likely represents a mildly complicated cyst. Short-term followup is recommended to assess for resolution.   Electronically Signed   By: Alcide CleverMark  Lukens M.D.   On: 06/26/2014 09:08   [place] Pelvis Complete  06/26/2014   CLINICAL DATA:  Pelvic pain with difficulty urinating  EXAM: TRANSABDOMINAL AND TRANSVAGINAL ULTRASOUND OF PELVIS  TECHNIQUE: Both transabdominal and transvaginal ultrasound examinations of the pelvis were performed. Transabdominal technique was performed for global imaging of the pelvis including uterus, ovaries, adnexal regions, and pelvic cul-de-sac. It was necessary to proceed with endovaginal exam following the transabdominal exam to visualize the adnexa.  COMPARISON:  None  FINDINGS: Uterus  Surgically removed.  Right ovary  Measurements: 3.7 x 1.7 x 2.3 cm. A 1.6 cm hypoechoic area is noted within the right ovary. This does not represent a simple cyst but may represent a mildly complicated cyst related to hemorrhage.  Left ovary  Measurements: 3.6 x 1.6 x 2.1 cm. A 6 mm ovarian cyst is noted.  Other findings  No free fluid.  IMPRESSION: Ovarian cystic changes.  Hypoechoic structure within the right ovary which likely represents a mildly complicated cyst. Short-term followup is recommended to assess  for resolution.   Electronically Signed   By: Alcide Clever M.D.   On: 06/26/2014 09:08   Ct Renal Stone Study  06/26/2014   CLINICAL DATA:  Urinary frequency with pain. Suprapubic pain. Symptoms for 3 hr.  EXAM: CT ABDOMEN AND PELVIS WITHOUT CONTRAST  TECHNIQUE: Multidetector CT imaging of the abdomen and pelvis was performed following the standard protocol without IV contrast.  COMPARISON:  None.  FINDINGS: Lower chest: Lung bases are clear. No effusions. Heart is normal size.  Hepatobiliary: Prior cholecystectomy.  No focal liver abnormality.  Pancreas: No focal abnormality or ductal dilatation.  Spleen: No focal abnormality.  Normal size.  Adrenals/Urinary Tract: No focal renal or adrenal abnormality. No hydronephrosis. Urinary bladder is unremarkable.  Stomach/Bowel: Stomach, large and small bowel grossly unremarkable. Appendix is not definitively seen. No inflammatory process in the right lower quadrant.  Vascular/Lymphatic: No retroperitoneal or mesenteric adenopathy. Aorta normal caliber.  Reproductive: Prior hysterectomy.  No adnexal mass.  Other: No free fluid or free air.  Musculoskeletal: No focal bone lesion or acute bony abnormality.  IMPRESSION: No renal or ureteral stones.  No hydronephrosis.  No acute findings.   Electronically Signed   By: Charlett Nose M.D.   On: 06/26/2014 06:22    Patient turned over to me. Ultrasound does show evidence of a complex right ovarian cyst follow-up with GYN will be required to treat with anti-inflammatories and pain medicine the meantime. Patient made aware of the findings.  Vanetta Mulders, MD 06/26/14 [phone]

## 2014-06-26 NOTE — ED Provider Notes (Signed)
CSN: [id number]     Arrival date & time 06/26/14  0426 History   First MD Initiated Contact with Patient 06/26/14 [phone]     Chief Complaint  Patient presents with  . Urinary Retention     (Consider location/radiation/quality/duration/timing/severity/associated sxs/prior Treatment) HPI  This is a 34 year old female who developed a sensation of urinary urgency about 3 hours prior to arrival. This is been associated with difficulty voiding more than small amounts. She is also having pelvic pain radiating to her vulva which she rates as a 9 out of 10. She had some lightheadedness which she attributes to the pain. She denies nausea or vomiting. She was catheterized by her nurse prior to my evaluation with only minimal urine obtained, though she continues to feel the need to void.  Past Medical History  Diagnosis Date  . Migraine   . Depression   . Bipolar 1 disorder   . Otitis media   . Strep throat   . Fibromyalgia 04/2012  . Restless leg syndrome   . Menorrhagia   . Anxiety   . Asthma     as a child- no inhaler  . Diabetes mellitus without complication    Past Surgical History  Procedure Laterality Date  . Tonsillectomy    . Cholecystectomy    . Tubal ligation    . Tympanostomy    . Laparoscopic assisted vaginal hysterectomy N/A 10/08/2012    Procedure: LAPAROSCOPIC ASSISTED VAGINAL HYSTERECTOMY;  Surgeon: Willodean Rosenthal, MD;  Location: [facility];  Service: Gynecology;  Laterality: N/A;  . Bilateral salpingectomy Bilateral 10/08/2012    Procedure: BILATERAL SALPINGECTOMY;  Surgeon: Willodean Rosenthal, MD;  Location: [facility];  Service: Gynecology;  Laterality: Bilateral;   Family History  Problem Relation Age of Onset  . Depression Mother    History  Substance Use Topics  . Smoking status: Current Every Day Smoker -- 1.00 packs/day for 15 years    Types: Cigarettes  . Smokeless tobacco: Never Used  . Alcohol Use: No   OB History    Gravida Para Term Preterm AB TAB SAB  Ectopic Multiple Living   6 3 2 1 3  3   3      Review of Systems  All other systems reviewed and are negative.   Allergies  Morphine and related and Reglan  Home Medications   Prior to Admission medications   Medication Sig Start Date End Date Taking? Authorizing Provider  clonazePAM (KLONOPIN) 0.5 MG tablet Take 0.5 mg by mouth 2 (two) times daily as needed for anxiety.   Yes Historical Provider, MD  Aspirin-Acetaminophen-Caffeine (GOODY HEADACHE PO) Take 1 packet by mouth daily as needed (aches/pain).    Historical Provider, MD  busPIRone (BUSPAR) 10 MG tablet Take 10 mg by mouth 3 (three) times daily.    Historical Provider, MD  busPIRone (BUSPAR) 5 MG tablet Take 5 mg by mouth 2 (two) times daily.    Historical Provider, MD  cetirizine (ZYRTEC) 10 MG tablet Take 10 mg by mouth daily.    Historical Provider, MD  desvenlafaxine (PRISTIQ) 100 MG 24 hr tablet Take 100 mg by mouth daily.    Historical Provider, MD  desvenlafaxine (PRISTIQ) 50 MG 24 hr tablet Take 50 mg by mouth daily.    Historical Provider, MD  diclofenac (FLECTOR) 1.3 % PTCH Place 1 patch onto the skin 2 (two) times daily. 05/04/14   April Palumbo, MD  gabapentin (NEURONTIN) 600 MG tablet Take 600 mg by mouth 3 (three) times daily.  Historical Provider, MD  HYDROcodone-acetaminophen (NORCO/VICODIN) 5-325 MG per tablet Take 1 tablet by mouth every 6 (six) hours as needed for moderate pain.    Historical Provider, MD  hydrocortisone 2.5 % cream Apply to hemorrhoids twice daily for one week. 04/01/14   French Kendra, MD  hydrOXYzine (ATARAX/VISTARIL) 25 MG tablet Take 50 mg by mouth at bedtime.    Historical Provider, MD  lidocaine (XYLOCAINE) 5 % ointment Apply to hemorrhoids as needed for pain 04/01/14   Paula Libra, MD  loperamide (IMODIUM) 2 MG capsule Take 1 capsule (2 mg total) by mouth 4 (four) times daily as needed for diarrhea or loose stools. 11/28/12   Willodean Rosenthal, MD  meloxicam (MOBIC) 7.5 MG tablet Take  1 tablet (7.5 mg total) by mouth daily. 03/02/13   April Palumbo, MD  meloxicam (MOBIC) 7.5 MG tablet Take 1 tablet (7.5 mg total) by mouth daily. 10/21/13   April Palumbo, MD  metFORMIN (GLUCOPHAGE) 500 MG tablet Take 250 mg by mouth daily after supper.    Historical Provider, MD  oxyCODONE-acetaminophen (PERCOCET/ROXICET) 5-325 MG per tablet Take 1-2 tablets by mouth every 6 (six) hours as needed for pain. 10/08/12   Willodean Rosenthal, MD  predniSONE (DELTASONE) 20 MG tablet 3 tabs po day one, then 2 po daily x 4 days 05/04/14   April Palumbo, MD  QUEtiapine (SEROQUEL) 25 MG tablet Take 25 mg by mouth at bedtime.    Historical Provider, MD  Rivaroxaban (XARELTO) 20 MG TABS tablet Take 1 tablet (20 mg total) by mouth daily. Take for 3 months, after the 15 mg bid for 3 wks, take with food 10/30/12   Reva Bores, MD  tiZANidine (ZANAFLEX) 4 MG tablet Take 4 mg by mouth every 6 (six) hours as needed (muscle spasms).    Historical Provider, MD  topiramate (TOPAMAX) 25 MG tablet Take 75 mg by mouth 2 (two) times daily.     Historical Provider, MD  traMADol (ULTRAM) 50 MG tablet Take 1 tablet (50 mg total) by mouth every 6 (six) hours as needed. 02/18/13   Geoffery Lyons, MD   BP 114/65 mmHg  Pulse 67  Temp(Src) 97.6 F (36.4 C) (Oral)  Resp 18  Ht  (1.549 m)  Wt 114 lb (51.71 kg)  BMI 21.55 kg/m2  SpO2 100%  LMP 10/02/2012   Physical Exam  General: Well-developed, well-nourished female; appearance consistent with age of record HENT: normocephalic; atraumatic Eyes: pupils equal, round and reactive to light; extraocular muscles intact Neck: supple Heart: regular rate and rhythm Lungs: clear to auscultation bilaterally Abdomen: soft; nondistended; nontender; no masses or hepatosplenomegaly; bowel sounds present GU: Normal external genitalia; right adnexal tenderness; no cervix palpated Extremities: No deformity; full range of motion; pulses normal Neurologic: Awake, alert and oriented;  motor function intact in all extremities and symmetric; no facial droop Skin: Warm and dry Psychiatric: Anxious; agitated; tearful    ED Course  Procedures (including critical care time)   MDM   Nursing notes and vitals signs, including pulse oximetry, reviewed.  Summary of this visit's results, reviewed by myself:  Labs:  Results for orders placed or performed during the hospital encounter of 06/26/14 (from the past 24 hour(s))  Urinalysis, Routine w reflex microscopic     Status: Abnormal   Collection Time: 06/26/14  4:50 AM  Result Value Ref Range   Color, Urine YELLOW YELLOW   APPearance CLEAR CLEAR   Specific Gravity, Urine 1.043 (H) 1.005 - 1.030   pH  5.0 5.0 - 8.0   Glucose, UA NEGATIVE NEGATIVE mg/dL   Hgb urine dipstick NEGATIVE NEGATIVE   Bilirubin Urine NEGATIVE NEGATIVE   Ketones, ur NEGATIVE NEGATIVE mg/dL   Protein, ur NEGATIVE NEGATIVE mg/dL   Urobilinogen, UA 0.2 0.0 - 1.0 mg/dL   Nitrite NEGATIVE NEGATIVE   Leukocytes, UA NEGATIVE NEGATIVE  CBC with Differential/Platelet     Status: Abnormal   Collection Time: 06/26/14  5:41 AM  Result Value Ref Range   WBC 13.4 (H) 4.0 - 10.5 K/uL   RBC 4.41 3.87 - 5.11 MIL/uL   Hemoglobin 14.0 12.0 - 15.0 g/dL   HCT 16.141.0 09.636.0 - 04.546.0 %   MCV 93.0 78.0 - 100.0 fL   MCH 31.7 26.0 - 34.0 pg   MCHC 34.1 30.0 - 36.0 g/dL   RDW 40.912.2 81.111.5 - 91.415.5 %   Platelets 299 150 - 400 K/uL   Neutrophils Relative % 51 43 - 77 %   Neutro Abs 6.9 1.7 - 7.7 K/uL   Lymphocytes Relative 36 12 - 46 %   Lymphs Abs 4.9 (H) 0.7 - 4.0 K/uL   Monocytes Relative 7 3 - 12 %   Monocytes Absolute 0.9 0.1 - 1.0 K/uL   Eosinophils Relative 5 0 - 5 %   Eosinophils Absolute 0.7 0.0 - 0.7 K/uL   Basophils Relative 1 0 - 1 %   Basophils Absolute 0.1 0.0 - 0.1 K/uL  Basic metabolic panel     Status: Abnormal   Collection Time: 06/26/14  5:41 AM  Result Value Ref Range   Sodium 142 135 - 145 mmol/L   Potassium 3.1 (L) 3.5 - 5.1 mmol/L   Chloride  114 (H) 96 - 112 mmol/L   CO2 23 19 - 32 mmol/L   Glucose, Bld 95 70 - 99 mg/dL   BUN 12 6 - 23 mg/dL   Creatinine, Ser 7.820.91 0.50 - 1.10 mg/dL   Calcium 9.2 8.4 - 95.610.5 mg/dL   GFR calc non Af Amer 81 (L) >90 mL/min   GFR calc Af Amer >90 >90 mL/min   Anion gap 5 5 - 15    Imaging Studies: Ct Renal Stone Study  06/26/2014   CLINICAL DATA:  Urinary frequency with pain. Suprapubic pain. Symptoms for 3 hr.  EXAM: CT ABDOMEN AND PELVIS WITHOUT CONTRAST  TECHNIQUE: Multidetector CT imaging of the abdomen and pelvis was performed following the standard protocol without IV contrast.  COMPARISON:  None.  FINDINGS: Lower chest: Lung bases are clear. No effusions. Heart is normal size.  Hepatobiliary: Prior cholecystectomy.  No focal liver abnormality.  Pancreas: No focal abnormality or ductal dilatation.  Spleen: No focal abnormality.  Normal size.  Adrenals/Urinary Tract: No focal renal or adrenal abnormality. No hydronephrosis. Urinary bladder is unremarkable.  Stomach/Bowel: Stomach, large and small bowel grossly unremarkable. Appendix is not definitively seen. No inflammatory process in the right lower quadrant.  Vascular/Lymphatic: No retroperitoneal or mesenteric adenopathy. Aorta normal caliber.  Reproductive: Prior hysterectomy.  No adnexal mass.  Other: No free fluid or free air.  Musculoskeletal: No focal bone lesion or acute bony abnormality.  IMPRESSION: No renal or ureteral stones.  No hydronephrosis.  No acute findings.   Electronically Signed   By: Charlett NoseKevin  Dover M.D.   On: 06/26/2014 06:22   6:30 AM Patient's pain relieved with fentanyl. Patient resting comfortably. Because the patient has had a hysterectomy but retains her ovaries we will obtain a pelvic ultrasound to evaluate for ovarian torsion.  6:59 AM Patient awaiting ultrasound. Dr. Deretha Emory will follow up on results and make disposition.       Paula Libra, MD 06/26/14 0700

## 2014-08-06 ENCOUNTER — Encounter (HOSPITAL_BASED_OUTPATIENT_CLINIC_OR_DEPARTMENT_OTHER): Payer: Self-pay

## 2014-08-06 ENCOUNTER — Emergency Department (HOSPITAL_BASED_OUTPATIENT_CLINIC_OR_DEPARTMENT_OTHER)
Admission: EM | Admit: 2014-08-06 | Discharge: 2014-08-06 | Disposition: A | Discharge status: Home / Self Care | Payer: Medicaid Other | Attending: Emergency Medicine | Admitting: Emergency Medicine

## 2014-08-06 DIAGNOSIS — Z720 Tobacco use: Secondary | ICD-10-CM | POA: Insufficient documentation

## 2014-08-06 DIAGNOSIS — Z9851 Tubal ligation status: Secondary | ICD-10-CM | POA: Insufficient documentation

## 2014-08-06 DIAGNOSIS — G43909 Migraine, unspecified, not intractable, without status migrainosus: Secondary | ICD-10-CM | POA: Insufficient documentation

## 2014-08-06 DIAGNOSIS — M797 Fibromyalgia: Secondary | ICD-10-CM | POA: Insufficient documentation

## 2014-08-06 DIAGNOSIS — J45909 Unspecified asthma, uncomplicated: Secondary | ICD-10-CM | POA: Diagnosis not present

## 2014-08-06 DIAGNOSIS — N390 Urinary tract infection, site not specified: Secondary | ICD-10-CM | POA: Insufficient documentation

## 2014-08-06 DIAGNOSIS — F419 Anxiety disorder, unspecified: Secondary | ICD-10-CM | POA: Diagnosis not present

## 2014-08-06 DIAGNOSIS — G2581 Restless legs syndrome: Secondary | ICD-10-CM | POA: Insufficient documentation

## 2014-08-06 DIAGNOSIS — E876 Hypokalemia: Secondary | ICD-10-CM | POA: Diagnosis not present

## 2014-08-06 DIAGNOSIS — Z8742 Personal history of other diseases of the female genital tract: Secondary | ICD-10-CM | POA: Insufficient documentation

## 2014-08-06 DIAGNOSIS — R1031 Right lower quadrant pain: Secondary | ICD-10-CM | POA: Diagnosis present

## 2014-08-06 DIAGNOSIS — Z8709 Personal history of other diseases of the respiratory system: Secondary | ICD-10-CM | POA: Insufficient documentation

## 2014-08-06 DIAGNOSIS — Z9049 Acquired absence of other specified parts of digestive tract: Secondary | ICD-10-CM | POA: Insufficient documentation

## 2014-08-06 DIAGNOSIS — E119 Type 2 diabetes mellitus without complications: Secondary | ICD-10-CM | POA: Diagnosis not present

## 2014-08-06 DIAGNOSIS — Z791 Long term (current) use of non-steroidal anti-inflammatories (NSAID): Secondary | ICD-10-CM | POA: Diagnosis not present

## 2014-08-06 DIAGNOSIS — F319 Bipolar disorder, unspecified: Secondary | ICD-10-CM | POA: Insufficient documentation

## 2014-08-06 DIAGNOSIS — Z79899 Other long term (current) drug therapy: Secondary | ICD-10-CM | POA: Insufficient documentation

## 2014-08-06 LAB — URINE MICROSCOPIC-ADD ON

## 2014-08-06 LAB — CBC WITH DIFFERENTIAL/PLATELET
BASOS ABS: 0.1 10*3/uL (ref 0.0–0.1)
Basophils Relative: 0 % (ref 0–1)
EOS ABS: 0.8 10*3/uL — AB (ref 0.0–0.7)
Eosinophils Relative: 6 % — ABNORMAL HIGH (ref 0–5)
HCT: 39.2 % (ref 36.0–46.0)
HEMOGLOBIN: 13.1 g/dL (ref 12.0–15.0)
LYMPHS ABS: 4.1 10*3/uL — AB (ref 0.7–4.0)
Lymphocytes Relative: 29 % (ref 12–46)
MCH: 31.6 pg (ref 26.0–34.0)
MCHC: 33.4 g/dL (ref 30.0–36.0)
MCV: 94.7 fL (ref 78.0–100.0)
MONOS PCT: 7 % (ref 3–12)
Monocytes Absolute: 1 10*3/uL (ref 0.1–1.0)
NEUTROS ABS: 8.3 10*3/uL — AB (ref 1.7–7.7)
Neutrophils Relative %: 58 % (ref 43–77)
PLATELETS: 255 10*3/uL (ref 150–400)
RBC: 4.14 MIL/uL (ref 3.87–5.11)
RDW: 12.5 % (ref 11.5–15.5)
WBC: 14.3 10*3/uL — ABNORMAL HIGH (ref 4.0–10.5)

## 2014-08-06 LAB — URINALYSIS, ROUTINE W REFLEX MICROSCOPIC
GLUCOSE, UA: NEGATIVE mg/dL
KETONES UR: 15 mg/dL — AB
NITRITE: NEGATIVE
Protein, ur: >300 mg/dL — AB
SPECIFIC GRAVITY, URINE: 1.03 (ref 1.005–1.030)
Urobilinogen, UA: 1 mg/dL (ref 0.0–1.0)
pH: 5.5 (ref 5.0–8.0)

## 2014-08-06 LAB — BASIC METABOLIC PANEL
Anion gap: 9 (ref 5–15)
BUN: 14 mg/dL (ref 6–20)
CO2: 23 mmol/L (ref 22–32)
CREATININE: 0.87 mg/dL (ref 0.44–1.00)
Calcium: 9.3 mg/dL (ref 8.9–10.3)
Chloride: 110 mmol/L (ref 101–111)
GFR calc non Af Amer: >60 mL/min (ref >60)
Glucose, Bld: 99 mg/dL (ref 65–99)
POTASSIUM: 2.9 mmol/L — AB (ref 3.5–5.1)
SODIUM: 142 mmol/L (ref 135–145)

## 2014-08-06 MED ORDER — CEFTRIAXONE SODIUM 1 G IJ SOLR
1.0000 g | Freq: Once | INTRAMUSCULAR | Status: AC
  Administered 2014-08-06: 1 g via INTRAVENOUS

## 2014-08-06 MED ORDER — PHENAZOPYRIDINE HCL 200 MG PO TABS: 200.0000 mg | ORAL_TABLET | Freq: Three times a day (TID) | ORAL | Status: DC

## 2014-08-06 MED ORDER — POTASSIUM CHLORIDE ER 20 MEQ PO TBCR: 20.0000 meq | EXTENDED_RELEASE_TABLET | Freq: Every day | ORAL | Status: DC

## 2014-08-06 MED ORDER — PHENAZOPYRIDINE HCL 100 MG PO TABS
100.0000 mg | ORAL_TABLET | Freq: Once | ORAL | Status: AC
  Administered 2014-08-06: 100 mg via ORAL
  Filled 2014-08-06: qty 1

## 2014-08-06 MED ORDER — SODIUM CHLORIDE 0.9 % IV BOLUS (SEPSIS)
1000.0000 mL | Freq: Once | INTRAVENOUS | Status: AC
  Administered 2014-08-06: 1000 mL via INTRAVENOUS

## 2014-08-06 MED ORDER — CEFTRIAXONE SODIUM 1 G IJ SOLR
INTRAMUSCULAR | Status: AC
Start: 2014-08-06 — End: 2014-08-06
  Administered 2014-08-06: 1000 mg
  Filled 2014-08-06: qty 10

## 2014-08-06 MED ORDER — CIPROFLOXACIN HCL 500 MG PO TABS
500.0000 mg | ORAL_TABLET | Freq: Two times a day (BID) | ORAL | Status: DC
Start: 2014-08-06 — End: 2014-10-24

## 2014-08-06 MED ORDER — POTASSIUM CHLORIDE CRYS ER 20 MEQ PO TBCR
40.0000 meq | EXTENDED_RELEASE_TABLET | Freq: Once | ORAL | Status: AC
  Administered 2014-08-06: 40 meq via ORAL
  Filled 2014-08-06: qty 2

## 2014-08-06 MED ORDER — HYDROMORPHONE HCL 1 MG/ML IJ SOLN
1.0000 mg | Freq: Once | INTRAMUSCULAR | Status: AC
  Administered 2014-08-06: 1 mg via INTRAVENOUS
  Filled 2014-08-06: qty 1

## 2014-08-06 MED ORDER — KETOROLAC TROMETHAMINE 30 MG/ML IJ SOLN
30.0000 mg | Freq: Once | INTRAMUSCULAR | Status: AC
  Administered 2014-08-06: 30 mg via INTRAVENOUS
  Filled 2014-08-06: qty 1

## 2014-08-06 MED ORDER — ONDANSETRON HCL 4 MG/2ML IJ SOLN
4.0000 mg | Freq: Once | INTRAMUSCULAR | Status: AC
  Administered 2014-08-06: 4 mg via INTRAVENOUS
  Filled 2014-08-06: qty 2

## 2014-08-06 NOTE — ED Provider Notes (Signed)
CSN: [id number]     Arrival date & time 08/06/14  0503 History   First MD Initiated Contact with Patient 08/06/14 [phone]     Chief Complaint  Patient presents with  . Abdominal Pain     (Consider location/radiation/quality/duration/timing/severity/associated sxs/prior Treatment) HPI  This is a 34 year old female with a history of diabetes, ovarian cyst, hysterectomy who presents with right lower quadrant and suprapubic pain. Patient reports that she woke up this morning in pain. It is sharp and achy and nonradiating. Reports urinary urgency and some relief of pain with urination. Denies dysuria. Reports hematuria but no flank pain. Patient currently rates pain at 9 out of 10. She took a Goody powder at home without relief. Patient denies any fevers, nausea, vomiting, or diarrhea.  Reports similar pain in the past with an ovarian cyst.  Past Medical History  Diagnosis Date  . Migraine   . Depression   . Bipolar 1 disorder   . Otitis media   . Strep throat   . Fibromyalgia 04/2012  . Restless leg syndrome   . Menorrhagia   . Anxiety   . Asthma     as a child- no inhaler  . Diabetes mellitus without complication    Past Surgical History  Procedure Laterality Date  . Tonsillectomy    . Cholecystectomy    . Tubal ligation    . Tympanostomy    . Laparoscopic assisted vaginal hysterectomy N/A 10/08/2012    Procedure: LAPAROSCOPIC ASSISTED VAGINAL HYSTERECTOMY;  Surgeon: Willodean Rosenthal, MD;  Location: [facility];  Service: Gynecology;  Laterality: N/A;  . Bilateral salpingectomy Bilateral 10/08/2012    Procedure: BILATERAL SALPINGECTOMY;  Surgeon: Willodean Rosenthal, MD;  Location: [facility];  Service: Gynecology;  Laterality: Bilateral;   Family History  Problem Relation Age of Onset  . Depression Mother    History  Substance Use Topics  . Smoking status: Current Every Day Smoker -- 1.00 packs/day for 15 years    Types: Cigarettes  . Smokeless tobacco: Never Used  . Alcohol  Use: No   OB History    Gravida Para Term Preterm AB TAB SAB Ectopic Multiple Living   Review of Systems  Constitutional: Negative for fever.  Respiratory: Negative for chest tightness and shortness of breath.   Cardiovascular: Negative for chest pain.  Gastrointestinal: Positive for abdominal pain. Negative for nausea and vomiting.  Genitourinary: Positive for frequency, hematuria and difficulty urinating. Negative for dysuria.  Musculoskeletal: Negative for back pain.  All other systems reviewed and are negative.     Allergies  Morphine and related and Reglan  Home Medications   Prior to Admission medications   Medication Sig Start Date End Date Taking? Authorizing Provider  Aspirin-Acetaminophen-Caffeine (GOODY HEADACHE PO) Take 1 packet by mouth daily as needed (aches/pain).    Historical Provider, MD  busPIRone (BUSPAR) 10 MG tablet Take 10 mg by mouth 3 (three) times daily.    Historical Provider, MD  busPIRone (BUSPAR) 5 MG tablet Take 5 mg by mouth 2 (two) times daily.    Historical Provider, MD  cetirizine (ZYRTEC) 10 MG tablet Take 10 mg by mouth daily.    Historical Provider, MD  ciprofloxacin (CIPRO) 500 MG tablet Take 1 tablet (500 mg total) by mouth 2 (two) times daily. 08/06/14   Shon Baton, MD  clonazePAM (KLONOPIN) 0.5 MG tablet Take 0.5 mg by mouth 2 (two) times daily as needed for  anxiety.    Historical Provider, MD  desvenlafaxine (PRISTIQ) 100 MG 24 hr tablet Take 100 mg by mouth daily.    Historical Provider, MD  desvenlafaxine (PRISTIQ) 50 MG 24 hr tablet Take 50 mg by mouth daily.    Historical Provider, MD  diclofenac (FLECTOR) 1.3 % PTCH Place 1 patch onto the skin 2 (two) times daily. 05/04/14   April Palumbo, MD  gabapentin (NEURONTIN) 600 MG tablet Take 600 mg by mouth 3 (three) times daily.    Historical Provider, MD  HYDROcodone-acetaminophen (NORCO/VICODIN) 5-325 MG per tablet Take 1 tablet by mouth every 6 (six) hours as  needed for moderate pain.    Historical Provider, MD  HYDROcodone-acetaminophen (NORCO/VICODIN) 5-325 MG per tablet Take 1-2 tablets by mouth every 6 (six) hours as needed for moderate pain. 06/26/14   Vanetta MuldersScott Zackowski, MD  hydrocortisone 2.5 % cream Apply to hemorrhoids twice daily for one week. 04/01/14   John Molpus, MD  hydrOXYzine (ATARAX/VISTARIL) 25 MG tablet Take 50 mg by mouth at bedtime.    Historical Provider, MD  lidocaine (XYLOCAINE) 5 % ointment Apply to hemorrhoids as needed for pain 04/01/14   Paula LibraJohn Molpus, MD  loperamide (IMODIUM) 2 MG capsule Take 1 capsule (2 mg total) by mouth 4 (four) times daily as needed for diarrhea or loose stools. 11/28/12   Willodean Rosenthalarolyn Harraway-Smith, MD  meloxicam (MOBIC) 7.5 MG tablet Take 1 tablet (7.5 mg total) by mouth daily. 03/02/13   April Palumbo, MD  meloxicam (MOBIC) 7.5 MG tablet Take 1 tablet (7.5 mg total) by mouth daily. 10/21/13   April Palumbo, MD  metFORMIN (GLUCOPHAGE) 500 MG tablet Take 250 mg by mouth daily after supper.    Historical Provider, MD  naproxen (NAPROSYN) 500 MG tablet Take 1 tablet (500 mg total) by mouth 2 (two) times daily. 06/26/14   Vanetta MuldersScott Zackowski, MD  oxyCODONE-acetaminophen (PERCOCET/ROXICET) 5-325 MG per tablet Take 1-2 tablets by mouth every 6 (six) hours as needed for pain. 10/08/12   Willodean Rosenthalarolyn Harraway-Smith, MD  phenazopyridine (PYRIDIUM) 200 MG tablet Take 1 tablet (200 mg total) by mouth 3 (three) times daily. 08/06/14   Shon Batonourtney F Rudie Sermons, MD  potassium chloride 20 MEQ TBCR Take 20 mEq by mouth daily. 08/06/14   Shon Batonourtney F Graiden Henes, MD  predniSONE (DELTASONE) 20 MG tablet 3 tabs po day one, then 2 po daily x 4 days 05/04/14   April Palumbo, MD  QUEtiapine (SEROQUEL) 25 MG tablet Take 25 mg by mouth at bedtime.    Historical Provider, MD  Rivaroxaban (XARELTO) 20 MG TABS tablet Take 1 tablet (20 mg total) by mouth daily. Take for 3 months, after the 15 mg bid for 3 wks, take with food 10/30/12   Reva Boresanya S Pratt, MD  tiZANidine  (ZANAFLEX) 4 MG tablet Take 4 mg by mouth every 6 (six) hours as needed (muscle spasms).    Historical Provider, MD  topiramate (TOPAMAX) 25 MG tablet Take 75 mg by mouth 2 (two) times daily.     Historical Provider, MD  traMADol (ULTRAM) 50 MG tablet Take 1 tablet (50 mg total) by mouth every 6 (six) hours as needed. 02/18/13   Geoffery Lyonsouglas Delo, MD   BP 113/71 mmHg  Pulse 72  Temp(Src) 97.7 F (36.5 C) (Oral)  Resp 18  Ht 5\' 1"  (1.549 m)  Wt 119 lb (53.978 kg)  BMI 22.50 kg/m2  SpO2 100%  LMP 10/02/2012 Physical Exam  Constitutional: She is oriented to person, place, and time. She appears well-developed and  well-nourished.  Uncomfortable appearing  HENT:  Head: Normocephalic and atraumatic.  Cardiovascular: Normal rate, regular rhythm and normal heart sounds.   No murmur heard. Pulmonary/Chest: Effort normal and breath sounds normal. No respiratory distress. She has no wheezes.  Abdominal: Soft. Bowel sounds are normal. There is tenderness. There is no rebound and no guarding.  Suprapubic tenderness to palpation without rebound or guarding  Neurological: She is alert and oriented to person, place, and time.  Skin: Skin is warm and dry.  Psychiatric: She has a normal mood and affect.  Nursing note and vitals reviewed.   ED Course  Procedures (including critical care time) Labs Review Labs Reviewed  URINALYSIS, ROUTINE W REFLEX MICROSCOPIC (NOT AT [facility]) - Abnormal; Notable for the following:    APPearance TURBID (*)    Hgb urine dipstick LARGE (*)    Bilirubin Urine SMALL (*)    Ketones, ur 15 (*)    Protein, ur >300 (*)    Leukocytes, UA LARGE (*)    All other components within normal limits  CBC WITH DIFFERENTIAL/PLATELET - Abnormal; Notable for the following:    WBC 14.3 (*)    Neutro Abs 8.3 (*)    Lymphs Abs 4.1 (*)    Eosinophils Relative 6 (*)    Eosinophils Absolute 0.8 (*)    All other components within normal limits  BASIC METABOLIC PANEL - Abnormal; Notable for  the following:    Potassium 2.9 (*)    All other components within normal limits  URINE MICROSCOPIC-ADD ON - Abnormal; Notable for the following:    Bacteria, UA MANY (*)    All other components within normal limits  URINE CULTURE    Imaging Review No results found.   EKG Interpretation None      MDM   Final diagnoses:  UTI (lower urinary tract infection)  Hypokalemia    Patient presents with right lower quadrant suprapubic pain onset this morning. Uncomfortable but nontoxic on exam. Associated urinary symptoms.  Patient given pain and nausea medication as well as fluids.  Urinalysis with too numerous to count white blood cells and many bacteria. Patient also developed gross hematuria. Urine culture sent patient given IV Rocephin. Denies any flank pain. Low suspicion at this time for pyelonephritis. Patient does have a leukocytosis to 14. Her baseline is 12-13. Potassium 2.9. This was replaced. A suspected urinary tract infection as source of patient's discomfort. Will discharge with a seven-day course of ciprofloxacin in case this is early pyelonephritis given leukocytosis. Will also be given Pyridium for bladder spasm.  After history, exam, and medical workup I feel the patient has been appropriately medically screened and is safe for discharge home. Pertinent diagnoses were discussed with the patient. Patient was given return precautions.     Shon Baton, MD 08/06/14 [phone]

## 2014-08-06 NOTE — ED Notes (Signed)
Pt c/o RLQ pain since 0430, took a goodies powder with no relief; states hx of same

## 2014-08-06 NOTE — Discharge Instructions (Signed)
Urinary Tract Infection Urinary tract infections (UTIs) can develop anywhere along your urinary tract. Your urinary tract is your body's drainage system for removing wastes and extra water. Your urinary tract includes two kidneys, two ureters, a bladder, and a urethra. Your kidneys are a pair of bean-shaped organs. Each kidney is about the size of your fist. They are located below your ribs, one on each side of your spine. CAUSES Infections are caused by microbes, which are microscopic organisms, including fungi, viruses, and bacteria. These organisms are so small that they can only be seen through a microscope. Bacteria are the microbes that most commonly cause UTIs. SYMPTOMS  Symptoms of UTIs may vary by age and gender of the patient and by the location of the infection. Symptoms in young women typically include a frequent and intense urge to urinate and a painful, burning feeling in the bladder or urethra during urination. Older women and men are more likely to be tired, shaky, and weak and have muscle aches and abdominal pain. A fever may mean the infection is in your kidneys. Other symptoms of a kidney infection include pain in your back or sides below the ribs, nausea, and vomiting. DIAGNOSIS To diagnose a UTI, your caregiver will ask you about your symptoms. Your caregiver also will ask to provide a urine sample. The urine sample will be tested for bacteria and white blood cells. White blood cells are made by your body to help fight infection. TREATMENT  Typically, UTIs can be treated with medication. Because most UTIs are caused by a bacterial infection, they usually can be treated with the use of antibiotics. The choice of antibiotic and length of treatment depend on your symptoms and the type of bacteria causing your infection. HOME CARE INSTRUCTIONS  If you were prescribed antibiotics, take them exactly as your caregiver instructs you. Finish the medication even if you feel better after you  have only taken some of the medication.  Drink enough water and fluids to keep your urine clear or pale yellow.  Avoid caffeine, tea, and carbonated beverages. They tend to irritate your bladder.  Empty your bladder often. Avoid holding urine for long periods of time.  Empty your bladder before and after sexual intercourse.  After a bowel movement, women should cleanse from front to back. Use each tissue only once. SEEK MEDICAL CARE IF:   You have back pain.  You develop a fever.  Your symptoms do not begin to resolve within 3 days. SEEK IMMEDIATE MEDICAL CARE IF:   You have severe back pain or lower abdominal pain.  You develop chills.  You have nausea or vomiting.  You have continued burning or discomfort with urination. MAKE SURE YOU:   Understand these instructions.  Will watch your condition.  Will get help right away if you are not doing well or get worse. Document Released: 11/30/2004 Document Revised: 08/22/2011 Document Reviewed: 03/31/2011 [facility] Patient Information 2015 [place], [place]. This information is not intended to replace advice given to you by your health care provider. Make sure you discuss any questions you have with your health care provider. Hypokalemia Hypokalemia means that the amount of potassium in the blood is lower than normal.Potassium is a chemical, called an electrolyte, that helps regulate the amount of fluid in the body. It also stimulates muscle contraction and helps nerves function properly.Most of the body's potassium is inside of cells, and only a very small amount is in the blood. Because the amount in the blood is so  also stimulates muscle contraction and helps nerves function properly.Most of the body's potassium is inside of cells, and only a very small amount is in the blood. Because the amount in the blood is so small, minor changes can be life-threatening.  CAUSES   Antibiotics.   Diarrhea or vomiting.   Using laxatives too much, which can cause diarrhea.   Chronic kidney disease.   Water pills (diuretics).   Eating disorders (bulimia).   Low magnesium level.   Sweating a lot.  SIGNS AND  SYMPTOMS   Weakness.   Constipation.   Fatigue.   Muscle cramps.   Mental confusion.   Skipped heartbeats or irregular heartbeat (palpitations).   Tingling or numbness.  DIAGNOSIS   Your health care provider can diagnose hypokalemia with blood tests. In addition to checking your potassium level, your health care provider may also check other lab tests.  TREATMENT  Hypokalemia can be treated with potassium supplements taken by mouth or adjustments in your current medicines. If your potassium level is very low, you may need to get potassium through a vein (IV) and be monitored in the hospital. A diet high in potassium is also helpful. Foods high in potassium are:   Nuts, such as peanuts and pistachios.   Seeds, such as sunflower seeds and pumpkin seeds.   Peas, lentils, and lima beans.   Whole grain and bran cereals and breads.   Fresh fruit and vegetables, such as apricots, avocado, bananas, cantaloupe, kiwi, oranges, tomatoes, asparagus, and potatoes.   Orange and tomato juices.   Red meats.   Fruit yogurt.  HOME CARE INSTRUCTIONS   Take all medicines as prescribed by your health care provider.   Maintain a healthy diet by including nutritious food, such as fruits, vegetables, nuts, whole grains, and lean meats.   If you are taking a laxative, be sure to follow the directions on the label.  SEEK MEDICAL CARE IF:   Your weakness gets worse.   You feel your heart pounding or racing.   You are vomiting or having diarrhea.   You are diabetic and having trouble keeping your blood glucose in the normal range.  SEEK IMMEDIATE MEDICAL CARE IF:   You have chest pain, shortness of breath, or dizziness.   You are vomiting or having diarrhea for more than 2 days.   You faint.  MAKE SURE YOU:    Understand these instructions.   Will watch your condition.   Will get help right away if you are not doing well or get worse.  Document Released: 02/20/2005 Document Revised: 12/11/2012 Document Reviewed:  08/23/2012  [employer] Patient Information 2015 [employer]. This information is not intended to replace advice given to you by your health care provider. Make sure you discuss any questions you have with your health care provider.

## 2014-08-07 LAB — URINE CULTURE: Colony Count: 35000

## 2014-10-24 ENCOUNTER — Emergency Department (HOSPITAL_BASED_OUTPATIENT_CLINIC_OR_DEPARTMENT_OTHER)
Admission: EM | Admit: 2014-10-24 | Discharge: 2014-10-24 | Discharge status: Against Medical Advice | Payer: Medicaid Other | Attending: Emergency Medicine | Admitting: Emergency Medicine

## 2014-10-24 ENCOUNTER — Encounter (HOSPITAL_BASED_OUTPATIENT_CLINIC_OR_DEPARTMENT_OTHER): Payer: Self-pay | Admitting: *Deleted

## 2014-10-24 DIAGNOSIS — M549 Dorsalgia, unspecified: Secondary | ICD-10-CM | POA: Diagnosis present

## 2014-10-24 DIAGNOSIS — J45909 Unspecified asthma, uncomplicated: Secondary | ICD-10-CM | POA: Diagnosis not present

## 2014-10-24 DIAGNOSIS — Z720 Tobacco use: Secondary | ICD-10-CM | POA: Diagnosis not present

## 2014-10-24 DIAGNOSIS — G8929 Other chronic pain: Secondary | ICD-10-CM | POA: Insufficient documentation

## 2014-10-24 DIAGNOSIS — E119 Type 2 diabetes mellitus without complications: Secondary | ICD-10-CM | POA: Diagnosis not present

## 2014-10-24 NOTE — ED Notes (Signed)
Pt admits to chronic back pain, has been experiencing exacerbation this evening, pt took her prescribed zanaflex w/o relief. Pt denies any urinary problems, fever or n/v.

## 2014-10-24 NOTE — ED Notes (Signed)
Tiffany Whitney was escorted to [facility] with specimen container and then was unable to be found in the department shortly afterwards.

## 2014-10-25 ENCOUNTER — Encounter (HOSPITAL_BASED_OUTPATIENT_CLINIC_OR_DEPARTMENT_OTHER): Payer: Self-pay | Admitting: Emergency Medicine

## 2014-10-25 ENCOUNTER — Emergency Department (HOSPITAL_BASED_OUTPATIENT_CLINIC_OR_DEPARTMENT_OTHER)
Admission: EM | Admit: 2014-10-25 | Discharge: 2014-10-25 | Disposition: A | Discharge status: Home / Self Care | Payer: Medicaid Other | Attending: Emergency Medicine | Admitting: Emergency Medicine

## 2014-10-25 ENCOUNTER — Emergency Department (HOSPITAL_BASED_OUTPATIENT_CLINIC_OR_DEPARTMENT_OTHER): Payer: Medicaid Other

## 2014-10-25 DIAGNOSIS — G43909 Migraine, unspecified, not intractable, without status migrainosus: Secondary | ICD-10-CM | POA: Insufficient documentation

## 2014-10-25 DIAGNOSIS — F319 Bipolar disorder, unspecified: Secondary | ICD-10-CM | POA: Insufficient documentation

## 2014-10-25 DIAGNOSIS — Z79899 Other long term (current) drug therapy: Secondary | ICD-10-CM | POA: Diagnosis not present

## 2014-10-25 DIAGNOSIS — Y998 Other external cause status: Secondary | ICD-10-CM | POA: Insufficient documentation

## 2014-10-25 DIAGNOSIS — W108XXA Fall (on) (from) other stairs and steps, initial encounter: Secondary | ICD-10-CM | POA: Diagnosis not present

## 2014-10-25 DIAGNOSIS — Y9301 Activity, walking, marching and hiking: Secondary | ICD-10-CM | POA: Diagnosis not present

## 2014-10-25 DIAGNOSIS — S6992XA Unspecified injury of left wrist, hand and finger(s), initial encounter: Secondary | ICD-10-CM | POA: Insufficient documentation

## 2014-10-25 DIAGNOSIS — S3992XA Unspecified injury of lower back, initial encounter: Secondary | ICD-10-CM | POA: Insufficient documentation

## 2014-10-25 DIAGNOSIS — Z720 Tobacco use: Secondary | ICD-10-CM | POA: Diagnosis not present

## 2014-10-25 DIAGNOSIS — Z8742 Personal history of other diseases of the female genital tract: Secondary | ICD-10-CM | POA: Diagnosis not present

## 2014-10-25 DIAGNOSIS — Z791 Long term (current) use of non-steroidal anti-inflammatories (NSAID): Secondary | ICD-10-CM | POA: Insufficient documentation

## 2014-10-25 DIAGNOSIS — J45909 Unspecified asthma, uncomplicated: Secondary | ICD-10-CM | POA: Insufficient documentation

## 2014-10-25 DIAGNOSIS — E119 Type 2 diabetes mellitus without complications: Secondary | ICD-10-CM | POA: Insufficient documentation

## 2014-10-25 DIAGNOSIS — F419 Anxiety disorder, unspecified: Secondary | ICD-10-CM | POA: Insufficient documentation

## 2014-10-25 DIAGNOSIS — Y92009 Unspecified place in unspecified non-institutional (private) residence as the place of occurrence of the external cause: Secondary | ICD-10-CM | POA: Insufficient documentation

## 2014-10-25 DIAGNOSIS — M797 Fibromyalgia: Secondary | ICD-10-CM | POA: Diagnosis not present

## 2014-10-25 DIAGNOSIS — M7918 Myalgia, other site: Secondary | ICD-10-CM

## 2014-10-25 DIAGNOSIS — W19XXXA Unspecified fall, initial encounter: Secondary | ICD-10-CM

## 2014-10-25 MED ORDER — KETOROLAC TROMETHAMINE 60 MG/2ML IM SOLN
30.0000 mg | Freq: Once | INTRAMUSCULAR | Status: AC
  Administered 2014-10-25: 30 mg via INTRAMUSCULAR
  Filled 2014-10-25: qty 2

## 2014-10-25 NOTE — ED Notes (Signed)
Pt reports she fell down steps this evening, denies loss of consciousness or dizziness. Reports pain in ribs, back and left arm. No bruising or deformity present.

## 2014-10-25 NOTE — ED Provider Notes (Signed)
CSN: [id number]     Arrival date & time 10/25/14  1754 History   First MD Initiated Contact with Patient 10/25/14 1852     Chief Complaint  Patient presents with  . Fall     (Consider location/radiation/quality/duration/timing/severity/associated sxs/prior Treatment) HPI   Blood pressure 104/60, pulse 78, temperature 98.1 F (36.7 C), temperature source Oral, resp. rate 18, height 5' (1.524 m), weight 121 lb (54.885 kg), last menstrual period 10/02/2012, SpO2 100 %.  Tiffany Whitney is a 34 y.o. female complaining of right low thoracic rib pain and left wrist pain status post slip and fall. Patient was walking down a wooden steps inside her sister's house in socks and she slipped. There was no head trauma, cervicalgia, nausea, vomiting  Past Medical History  Diagnosis Date  . Migraine   . Depression   . Bipolar 1 disorder   . Otitis media   . Strep throat   . Fibromyalgia 04/2012  . Restless leg syndrome   . Menorrhagia   . Anxiety   . Asthma     as a child- no inhaler  . Diabetes mellitus without complication    Past Surgical History  Procedure Laterality Date  . Tonsillectomy    . Cholecystectomy    . Tubal ligation    . Tympanostomy    . Laparoscopic assisted vaginal hysterectomy N/A 10/08/2012    Procedure: LAPAROSCOPIC ASSISTED VAGINAL HYSTERECTOMY;  Surgeon: Willodean Rosenthal, MD;  Location: [facility];  Service: Gynecology;  Laterality: N/A;  . Bilateral salpingectomy Bilateral 10/08/2012    Procedure: BILATERAL SALPINGECTOMY;  Surgeon: Willodean Rosenthal, MD;  Location: [facility];  Service: Gynecology;  Laterality: Bilateral;   Family History  Problem Relation Age of Onset  . Depression Mother    Social History  Substance Use Topics  . Smoking status: Current Every Day Smoker -- 1.00 packs/day for 15 years    Types: Cigarettes  . Smokeless tobacco: Never Used  . Alcohol Use: No   OB History    Gravida Para Term Preterm AB TAB SAB Ectopic Multiple Living   6  3 2 1 3  3   3      Review of Systems    Allergies  Morphine and related and Reglan  Home Medications   Prior to Admission medications   Medication Sig Start Date End Date Taking? Authorizing Provider  Aspirin-Acetaminophen-Caffeine (GOODY HEADACHE PO) Take 1 packet by mouth daily as needed (aches/pain).    Historical Provider, MD  busPIRone (BUSPAR) 10 MG tablet Take 20 mg by mouth 3 (three) times daily.     Historical Provider, MD  cetirizine (ZYRTEC) 10 MG tablet Take 10 mg by mouth daily.    Historical Provider, MD  clonazePAM (KLONOPIN) 0.5 MG tablet Take 0.25 mg by mouth 2 (two) times daily as needed for anxiety.     Historical Provider, MD  cloNIDine (CATAPRES) 0.1 MG tablet Take 0.1 mg by mouth daily.    Historical Provider, MD  desvenlafaxine (PRISTIQ) 100 MG 24 hr tablet Take 50 mg by mouth daily.     Historical Provider, MD  desvenlafaxine (PRISTIQ) 50 MG 24 hr tablet Take 50 mg by mouth daily.    Historical Provider, MD  hydrocortisone 2.5 % cream Apply to hemorrhoids twice daily for one week. 04/01/14   John Molpus, MD  lidocaine (XYLOCAINE) 5 % ointment Apply to hemorrhoids as needed for pain 04/01/14   Paula Libra, MD  loperamide (IMODIUM) 2 MG capsule Take 1 capsule (2 mg total) by  mouth 4 (four) times daily as needed for diarrhea or loose stools. 11/28/12   Willodean Rosenthal, MD  lurasidone (LATUDA) 40 MG TABS tablet Take 30 mg by mouth daily with breakfast.    Historical Provider, MD  meloxicam (MOBIC) 7.5 MG tablet Take 1 tablet (7.5 mg total) by mouth daily. 03/02/13   April Palumbo, MD  tiZANidine (ZANAFLEX) 4 MG tablet Take 4 mg by mouth every 6 (six) hours as needed (muscle spasms).    Historical Provider, MD  topiramate (TOPAMAX) 25 MG tablet Take 75 mg by mouth 2 (two) times daily.     Historical Provider, MD   BP 104/60 mmHg  Pulse 78  Temp(Src) 98.1 F (36.7 C) (Oral)  Resp 18  Ht 5' (1.524 m)  Wt 121 lb (54.885 kg)  BMI 23.63 kg/m2  SpO2 100%  LMP  10/02/2012 Physical Exam  Constitutional: She is oriented to person, place, and time. She appears well-developed and well-nourished.  HENT:  Head: Normocephalic and atraumatic.  Mouth/Throat: Oropharynx is clear and moist.  No abrasions or contusions.   No hemotympanum, battle signs or raccoon's eyes  No crepitance or tenderness to palpation along the orbital rim.  EOMI intact with no pain or diplopia  No abnormal otorrhea or rhinorrhea. Nasal septum midline.  No intraoral trauma.  Eyes: Conjunctivae and EOM are normal. Pupils are equal, round, and reactive to light.  Neck: Normal range of motion. Neck supple.  No midline C-spine  tenderness to palpation or step-offs appreciated. Patient has full range of motion without pain.  Grip/Biceps/Tricep strength 5/5 bilaterally, sensation to UE intact bilaterally.    Cardiovascular: Normal rate, regular rhythm and intact distal pulses.   Pulmonary/Chest: Effort normal and breath sounds normal. No respiratory distress. She has no wheezes. She has no rales. She exhibits no tenderness.  No seatbelt sign, TTP or crepitance  Abdominal: Soft. Bowel sounds are normal. She exhibits no distension and no mass. There is no tenderness. There is no rebound and no guarding.  No Seatbelt Sign  Musculoskeletal: Normal range of motion. She exhibits tenderness. She exhibits no edema.  Pt is diffusely tender to palpation along the left wrist, this is distractible. No focal snuffbox tenderness.  Neurological: She is alert and oriented to person, place, and time.  Strength 5/5 x4 extremities   Distal sensation intact  Skin: Skin is warm.  Psychiatric: She has a normal mood and affect.  Nursing note and vitals reviewed.   ED Course  Procedures (including critical care time) Labs Review Labs Reviewed - No data to display  Imaging Review Dg Ribs Unilateral W/chest Right  10/25/2014   CLINICAL DATA:  Patient fell down steps today. Pain in right ribs,  back, and left wrist.  EXAM: RIGHT RIBS AND CHEST - 3+ VIEW  COMPARISON:  06/24/2014  FINDINGS: No fracture or other bone lesions are seen involving the ribs. There is no evidence of pneumothorax or pleural effusion. Both lungs are clear. Heart size and mediastinal contours are within normal limits.  IMPRESSION: Negative.   Electronically Signed   By: Burman Nieves M.D.   On: 10/25/2014 20:23   Dg Wrist Complete Left  10/25/2014   CLINICAL DATA:  Larey Seat down steps today. Pain in right ribs, back and left wrist pain.  EXAM: LEFT WRIST - COMPLETE 3+ VIEW  COMPARISON:  None.  FINDINGS: There is no evidence of fracture or dislocation. There is no evidence of arthropathy or other focal bone abnormality. Soft tissues are unremarkable.  IMPRESSION: Negative.   Electronically Signed   By: Amie Portland M.D.   On: 10/25/2014 20:23   I have personally reviewed and evaluated these images and lab results as part of my medical decision-making.   EKG Interpretation None      MDM   Final diagnoses:  Musculoskeletal pain  Fall at home, initial encounter    Filed Vitals:   10/25/14 1802  BP: 104/60  Pulse: 78  Temp: 98.1 F (36.7 C)  TempSrc: Oral  Resp: 18  Height: 5' (1.524 m)  Weight: 121 lb (54.885 kg)  SpO2: 100%    Medications  ketorolac (TORADOL) injection 30 mg (30 mg Intramuscular Given 10/25/14 1935)    Tiffany Whitney is a pleasant 34 y.o. female presenting with chest pain and wrist pain status post slip and fall. No objective signs of trauma. Patient is neurovascularly intact.  Plain films negative. Patient will be placed in wrist splint and encouraged to follow closely with her primary care doctor.  Evaluation does not show pathology that would require ongoing emergent intervention or inpatient treatment. Pt is hemodynamically stable and mentating appropriately. Discussed findings and plan with patient/guardian, who agrees with care plan. All questions answered. Return precautions  discussed and outpatient follow up given.    Wynetta Emery, PA-C 10/25/14 2041  Geoffery Lyons, MD 10/26/14 2259

## 2014-10-25 NOTE — ED Notes (Signed)
Pt alert, NAD, calm, interactive, lying in fetal position on L side, med injection given, to xray via stretcher.

## 2014-10-25 NOTE — Discharge Instructions (Signed)
For pain control you may take:   of ibuprofen (that is usually 4 over the counter pills)  3 times a day (take with food) and acetaminophen  (this is 3 over the counter pills) four times a day. Do not drink alcohol or combine with other medications that have acetaminophen as an ingredient (Read the labels!).    Please follow with your primary care doctor in the next 2 days for a check-up. They must obtain records for further management.   Do not hesitate to return to the Emergency Department for any new, worsening or concerning symptoms.   Please follow with your primary care doctor in the next 2 days for a check-up. They must obtain records for further management.   Do not hesitate to return to the Emergency Department for any new, worsening or concerning symptoms.

## 2014-10-25 NOTE — ED Notes (Signed)
Back from xray, pt sleeping, NAD, calm, no dyspnea noted.

## 2014-10-25 NOTE — ED Notes (Addendum)
Patient reports "I fell down my sisters steps".  Reports stairs were wooden and she was wearing socks and slipped. Reports pain to neck, back, right ribs and left arm.

## 2014-12-18 ENCOUNTER — Ambulatory Visit (HOSPITAL_BASED_OUTPATIENT_CLINIC_OR_DEPARTMENT_OTHER)
Admission: RE | Admit: 2014-12-18 | Discharge: 2014-12-18 | Disposition: A | Discharge status: Home / Self Care | Payer: Medicaid Other | Source: Ambulatory Visit | Attending: Emergency Medicine | Admitting: Emergency Medicine

## 2014-12-18 ENCOUNTER — Other Ambulatory Visit (HOSPITAL_BASED_OUTPATIENT_CLINIC_OR_DEPARTMENT_OTHER): Payer: Self-pay | Admitting: Emergency Medicine

## 2014-12-18 ENCOUNTER — Emergency Department (HOSPITAL_BASED_OUTPATIENT_CLINIC_OR_DEPARTMENT_OTHER)
Admission: EM | Admit: 2014-12-18 | Discharge: 2014-12-18 | Disposition: A | Discharge status: Home / Self Care | Payer: Medicaid Other | Attending: Emergency Medicine | Admitting: Emergency Medicine

## 2014-12-18 ENCOUNTER — Encounter (HOSPITAL_BASED_OUTPATIENT_CLINIC_OR_DEPARTMENT_OTHER): Payer: Self-pay | Admitting: Emergency Medicine

## 2014-12-18 DIAGNOSIS — Z720 Tobacco use: Secondary | ICD-10-CM | POA: Diagnosis not present

## 2014-12-18 DIAGNOSIS — Z9851 Tubal ligation status: Secondary | ICD-10-CM | POA: Insufficient documentation

## 2014-12-18 DIAGNOSIS — G43909 Migraine, unspecified, not intractable, without status migrainosus: Secondary | ICD-10-CM | POA: Diagnosis not present

## 2014-12-18 DIAGNOSIS — Z791 Long term (current) use of non-steroidal anti-inflammatories (NSAID): Secondary | ICD-10-CM | POA: Insufficient documentation

## 2014-12-18 DIAGNOSIS — J45909 Unspecified asthma, uncomplicated: Secondary | ICD-10-CM | POA: Diagnosis not present

## 2014-12-18 DIAGNOSIS — R1032 Left lower quadrant pain: Secondary | ICD-10-CM | POA: Insufficient documentation

## 2014-12-18 DIAGNOSIS — E119 Type 2 diabetes mellitus without complications: Secondary | ICD-10-CM | POA: Insufficient documentation

## 2014-12-18 DIAGNOSIS — Z90710 Acquired absence of both cervix and uterus: Secondary | ICD-10-CM | POA: Insufficient documentation

## 2014-12-18 DIAGNOSIS — F319 Bipolar disorder, unspecified: Secondary | ICD-10-CM | POA: Diagnosis not present

## 2014-12-18 DIAGNOSIS — M797 Fibromyalgia: Secondary | ICD-10-CM | POA: Diagnosis not present

## 2014-12-18 DIAGNOSIS — Z79899 Other long term (current) drug therapy: Secondary | ICD-10-CM | POA: Diagnosis not present

## 2014-12-18 DIAGNOSIS — N83209 Unspecified ovarian cyst, unspecified side: Secondary | ICD-10-CM

## 2014-12-18 DIAGNOSIS — G2581 Restless legs syndrome: Secondary | ICD-10-CM | POA: Diagnosis not present

## 2014-12-18 DIAGNOSIS — Z9049 Acquired absence of other specified parts of digestive tract: Secondary | ICD-10-CM | POA: Diagnosis not present

## 2014-12-18 DIAGNOSIS — Z7952 Long term (current) use of systemic steroids: Secondary | ICD-10-CM | POA: Diagnosis not present

## 2014-12-18 DIAGNOSIS — R102 Pelvic and perineal pain: Secondary | ICD-10-CM | POA: Insufficient documentation

## 2014-12-18 DIAGNOSIS — Z8742 Personal history of other diseases of the female genital tract: Secondary | ICD-10-CM | POA: Diagnosis not present

## 2014-12-18 LAB — URINALYSIS, ROUTINE W REFLEX MICROSCOPIC
Bilirubin Urine: NEGATIVE
GLUCOSE, UA: NEGATIVE mg/dL
Hgb urine dipstick: NEGATIVE
Ketones, ur: NEGATIVE mg/dL
LEUKOCYTES UA: NEGATIVE
Nitrite: NEGATIVE
PH: 5.5 (ref 5.0–8.0)
Protein, ur: NEGATIVE mg/dL
Specific Gravity, Urine: 1.033 — ABNORMAL HIGH (ref 1.005–1.030)
Urobilinogen, UA: 1 mg/dL (ref 0.0–1.0)

## 2014-12-18 MED ORDER — GI COCKTAIL ~~LOC~~
ORAL | Status: AC
  Filled 2014-12-18: qty 30

## 2014-12-18 MED ORDER — GI COCKTAIL ~~LOC~~
30.0000 mL | Freq: Once | ORAL | Status: AC
  Administered 2014-12-18: 30 mL via ORAL

## 2014-12-18 MED ORDER — ONDANSETRON 8 MG PO TBDP
8.0000 mg | ORAL_TABLET | Freq: Once | ORAL | Status: AC
  Administered 2014-12-18: 8 mg via ORAL
  Filled 2014-12-18: qty 1

## 2014-12-18 MED ORDER — HYDROMORPHONE HCL 1 MG/ML IJ SOLN
2.0000 mg | Freq: Once | INTRAMUSCULAR | Status: AC
Start: 2014-12-18 — End: 2014-12-18
  Administered 2014-12-18: 2 mg via INTRAMUSCULAR
  Filled 2014-12-18: qty 2

## 2014-12-18 MED ORDER — HYDROCODONE-ACETAMINOPHEN 5-325 MG PO TABS: 1.0000 | ORAL_TABLET | Freq: Four times a day (QID) | ORAL | Status: DC | PRN

## 2014-12-18 NOTE — ED Provider Notes (Signed)
CSN: [id number]     Arrival date & time 12/18/14  0235 History   First MD Initiated Contact with Patient 12/18/14 0253     Chief Complaint  Patient presents with  . Abdominal Pain     (Consider location/radiation/quality/duration/timing/severity/associated sxs/prior Treatment) HPI  This is a 34 year old female who is status post hysterectomy but not oophorectomy. She has a history of ovarian cysts. She is here with the acute onset of left lower quadrant pain that awakened her from sleep about 90 minutes ago. Pain is similar to previous ovarian cyst. The pain is sharp and worse with movement or palpation. There is no change in pain with urination and she denies dysuria. She does not have a fever.  Past Medical History  Diagnosis Date  . Migraine   . Depression   . Bipolar 1 disorder (HCC)   . Otitis media   . Strep throat   . Fibromyalgia 04/2012  . Restless leg syndrome   . Menorrhagia   . Anxiety   . Asthma     as a child- no inhaler  . Diabetes mellitus without complication [facility])    Past Surgical History  Procedure Laterality Date  . Tonsillectomy    . Cholecystectomy    . Tubal ligation    . Tympanostomy    . Laparoscopic assisted vaginal hysterectomy N/A 10/08/2012    Procedure: LAPAROSCOPIC ASSISTED VAGINAL HYSTERECTOMY;  Surgeon: Willodean Rosenthal, MD;  Location: [facility];  Service: Gynecology;  Laterality: N/A;  . Bilateral salpingectomy Bilateral 10/08/2012    Procedure: BILATERAL SALPINGECTOMY;  Surgeon: Willodean Rosenthal, MD;  Location: [facility];  Service: Gynecology;  Laterality: Bilateral;  . Abdominal hysterectomy     Family History  Problem Relation Age of Onset  . Depression Mother    Social History  Substance Use Topics  . Smoking status: Current Every Day Smoker -- 1.00 packs/day for 15 years    Types: Cigarettes  . Smokeless tobacco: Never Used  . Alcohol Use: No   OB History    Gravida Para Term Preterm AB TAB SAB Ectopic Multiple Living   Review of Systems  All other systems reviewed and are negative.   Allergies  Morphine and related and Reglan  Home Medications   Prior to Admission medications   Medication Sig Start Date End Date Taking? Authorizing Provider  Aspirin-Acetaminophen-Caffeine (GOODY HEADACHE PO) Take 1 packet by mouth daily as needed (aches/pain).    Historical Provider, MD  busPIRone (BUSPAR) 10 MG tablet Take 20 mg by mouth 3 (three) times daily.     Historical Provider, MD  cetirizine (ZYRTEC) 10 MG tablet Take 10 mg by mouth daily.    Historical Provider, MD  clonazePAM (KLONOPIN) 0.5 MG tablet Take 0.25 mg by mouth 2 (two) times daily as needed for anxiety.     Historical Provider, MD  cloNIDine (CATAPRES) 0.1 MG tablet Take 0.1 mg by mouth daily.    Historical Provider, MD  desvenlafaxine (PRISTIQ) 100 MG 24 hr tablet Take 50 mg by mouth daily.     Historical Provider, MD  desvenlafaxine (PRISTIQ) 50 MG 24 hr tablet Take 50 mg by mouth daily.    Historical Provider, MD  hydrocortisone 2.5 % cream Apply to hemorrhoids twice daily for one week. 04/01/14   Joslynne Klatt, MD  lidocaine (XYLOCAINE) 5 % ointment Apply to hemorrhoids as needed for pain 04/01/14   Paula Libra, MD  loperamide (  IMODIUM) 2 MG capsule Take 1 capsule (2 mg total) by mouth 4 (four) times daily as needed for diarrhea or loose stools. 11/28/12   Willodean Rosenthalarolyn Harraway-Smith, MD  lurasidone (LATUDA) 40 MG TABS tablet Take 25 mg by mouth daily with breakfast.     Historical Provider, MD  meloxicam (MOBIC) 7.5 MG tablet Take 1 tablet (7.5 mg total) by mouth daily. 03/02/13   April Palumbo, MD  tiZANidine (ZANAFLEX) 4 MG tablet Take 4 mg by mouth every 6 (six) hours as needed (muscle spasms).    Historical Provider, MD  topiramate (TOPAMAX) 25 MG tablet Take 150 mg by mouth 2 (two) times daily.     Historical Provider, MD   BP 108/71 mmHg  Pulse 74  Temp(Src) 98.3 F (36.8 C) (Oral)  Resp 16  Ht 5\' 1"  (1.549 m)  Wt 125 lb  (56.7 kg)  BMI 23.63 kg/m2  SpO2 100%  LMP 10/02/2012   Physical Exam  General: Well-developed, well-nourished female in no acute distress; appearance consistent with age of record HENT: normocephalic; atraumatic Eyes: pupils equal, round and reactive to light; extraocular muscles intact Neck: supple Heart: regular rate and rhythm Lungs: clear to auscultation bilaterally Abdomen: soft; nondistended; left suprapubic tenderness; no masses or hepatosplenomegaly; bowel sounds present GU: Normal external genitalia; no cervix; left adnexal tenderness Extremities: No deformity; full range of motion; pulses normal Neurologic: Awake, alert and oriented; motor function intact in all extremities and symmetric; no facial droop Skin: Warm and dry Psychiatric: Normal mood and affect    ED Course  Procedures (including critical care time)    MDM   Nursing notes and vitals signs, including pulse oximetry, reviewed.  Summary of this visit's results, reviewed by myself:  Labs:  Results for orders placed or performed during the hospital encounter of 12/18/14 (from the past 24 hour(s))  Urinalysis, Routine w reflex microscopic ([facility])     Status: Abnormal   Collection Time: 12/18/14  2:50 AM  Result Value Ref Range   Color, Urine YELLOW YELLOW   APPearance CLEAR CLEAR   Specific Gravity, Urine 1.033 (H) 1.005 - 1.030   pH 5.5 5.0 - 8.0   Glucose, UA NEGATIVE NEGATIVE mg/dL   Hgb urine dipstick NEGATIVE NEGATIVE   Bilirubin Urine NEGATIVE NEGATIVE   Ketones, ur NEGATIVE NEGATIVE mg/dL   Protein, ur NEGATIVE NEGATIVE mg/dL   Urobilinogen, UA 1.0 0.0 - 1.0 mg/dL   Nitrite NEGATIVE NEGATIVE   Leukocytes, UA NEGATIVE NEGATIVE    Suspect left ovarian cyst. We'll have her return for ultrasound later today.    Paula LibraJohn Bryceton Hantz, MD 12/18/14 [phone]

## 2014-12-18 NOTE — ED Notes (Signed)
Suprapubic pain x90 min.  Sts it woke her up.

## 2015-01-16 ENCOUNTER — Emergency Department (HOSPITAL_BASED_OUTPATIENT_CLINIC_OR_DEPARTMENT_OTHER)
Admission: EM | Admit: 2015-01-16 | Discharge: 2015-01-16 | Disposition: A | Discharge status: Home / Self Care | Payer: Medicaid Other | Attending: Emergency Medicine | Admitting: Emergency Medicine

## 2015-01-16 ENCOUNTER — Encounter (HOSPITAL_BASED_OUTPATIENT_CLINIC_OR_DEPARTMENT_OTHER): Payer: Self-pay | Admitting: *Deleted

## 2015-01-16 DIAGNOSIS — Z720 Tobacco use: Secondary | ICD-10-CM | POA: Diagnosis not present

## 2015-01-16 DIAGNOSIS — F319 Bipolar disorder, unspecified: Secondary | ICD-10-CM | POA: Diagnosis not present

## 2015-01-16 DIAGNOSIS — Z79899 Other long term (current) drug therapy: Secondary | ICD-10-CM | POA: Diagnosis not present

## 2015-01-16 DIAGNOSIS — Z8742 Personal history of other diseases of the female genital tract: Secondary | ICD-10-CM | POA: Insufficient documentation

## 2015-01-16 DIAGNOSIS — F410 Panic disorder [episodic paroxysmal anxiety] without agoraphobia: Secondary | ICD-10-CM | POA: Insufficient documentation

## 2015-01-16 DIAGNOSIS — G43909 Migraine, unspecified, not intractable, without status migrainosus: Secondary | ICD-10-CM | POA: Insufficient documentation

## 2015-01-16 DIAGNOSIS — F4310 Post-traumatic stress disorder, unspecified: Secondary | ICD-10-CM | POA: Diagnosis not present

## 2015-01-16 DIAGNOSIS — E119 Type 2 diabetes mellitus without complications: Secondary | ICD-10-CM | POA: Diagnosis not present

## 2015-01-16 DIAGNOSIS — M797 Fibromyalgia: Secondary | ICD-10-CM | POA: Diagnosis not present

## 2015-01-16 HISTORY — DX: Post-traumatic stress disorder, unspecified: F43.10

## 2015-01-16 MED ORDER — LORAZEPAM 2 MG/ML IJ SOLN
1.0000 mg | Freq: Once | INTRAMUSCULAR | Status: AC
  Administered 2015-01-16: 1 mg via INTRAVENOUS
  Filled 2015-01-16: qty 1

## 2015-01-16 NOTE — Discharge Instructions (Signed)

## 2015-01-16 NOTE — ED Notes (Signed)
Pt states feels like heart is racing,  Like heart is going to jump out of chest,  Crying,,  Feels like panic attack and anxiety

## 2015-01-16 NOTE — ED Provider Notes (Signed)
CSN: [id number]     Arrival date & time 01/16/15  0442 History   First MD Initiated Contact with Patient 01/16/15 [phone]     Chief Complaint  Patient presents with  . Panic Attack     (Consider location/radiation/quality/duration/timing/severity/associated sxs/prior Treatment) HPI  This is a 34 year old female with a history of anxiety and panic attacks. Her anxiety is a daily problem but she rarely has panic attacks. She is here with a panic attack that came out of the blue about 2 hours ago while she was watching a movie. There was no particular trigger. She characterizes it as her heart pounding and racing, difficulty getting her breath, agitation and tearfulness. The patient describes her symptoms as severe. They have not been relieved by 2 doses of 0.25 milligrams of Klonopin.  Past Medical History  Diagnosis Date  . Migraine   . Depression   . Bipolar 1 disorder (HCC)   . Otitis media   . Strep throat   . Fibromyalgia 04/2012  . Restless leg syndrome   . Menorrhagia   . Anxiety   . Asthma     as a child- no inhaler  . Diabetes mellitus without complication (HCC)   . PTSD (post-traumatic stress disorder)    Past Surgical History  Procedure Laterality Date  . Tonsillectomy    . Cholecystectomy    . Tubal ligation    . Tympanostomy    . Laparoscopic assisted vaginal hysterectomy N/A 10/08/2012    Procedure: LAPAROSCOPIC ASSISTED VAGINAL HYSTERECTOMY;  Surgeon: Willodean Rosenthal, MD;  Location: [facility];  Service: Gynecology;  Laterality: N/A;  . Bilateral salpingectomy Bilateral 10/08/2012    Procedure: BILATERAL SALPINGECTOMY;  Surgeon: Willodean Rosenthal, MD;  Location: [facility];  Service: Gynecology;  Laterality: Bilateral;  . Abdominal hysterectomy     Family History  Problem Relation Age of Onset  . Depression Mother    Social History  Substance Use Topics  . Smoking status: Current Every Day Smoker -- 1.00 packs/day for 15 years    Types: Cigarettes  .  Smokeless tobacco: Never Used  . Alcohol Use: No   OB History    Gravida Para Term Preterm AB TAB SAB Ectopic Multiple Living   Review of Systems  All other systems reviewed and are negative.   Allergies  Morphine and related and Reglan  Home Medications   Prior to Admission medications   Medication Sig Start Date End Date Taking? Authorizing Provider  ziprasidone (GEODON) 20 MG capsule Take 20 mg by mouth 2 (two) times daily with a meal.   Yes Historical Provider, MD  Aspirin-Acetaminophen-Caffeine (GOODY HEADACHE PO) Take 1 packet by mouth daily as needed (aches/pain).    Historical Provider, MD  busPIRone (BUSPAR) 10 MG tablet Take 20 mg by mouth 3 (three) times daily.     Historical Provider, MD  cetirizine (ZYRTEC) 10 MG tablet Take 10 mg by mouth daily.    Historical Provider, MD  clonazePAM (KLONOPIN) 0.5 MG tablet Take 0.25 mg by mouth 2 (two) times daily as needed for anxiety.     Historical Provider, MD  cloNIDine (CATAPRES) 0.1 MG tablet Take 0.1 mg by mouth daily.    Historical Provider, MD  desvenlafaxine (PRISTIQ) 50 MG 24 hr tablet Take 50 mg by mouth daily.    Historical Provider, MD  HYDROcodone-acetaminophen (NORCO) 5-325 MG tablet Take 1-2 tablets by mouth every 6 (six) hours as needed (  for pain). 12/18/14   Jak Haggar, MD  hydrocortisone 2.5 % cream Apply to hemorrhoids twice daily for one week. 04/01/14   Joyelle Siedlecki, MD  lidocaine (XYLOCAINE) 5 % ointment Apply to hemorrhoids as needed for pain 04/01/14   Paula LibraJohn Avyukt Cimo, MD  lurasidone (LATUDA) 40 MG TABS tablet Take 25 mg by mouth daily with breakfast.     Historical Provider, MD  tiZANidine (ZANAFLEX) 4 MG tablet Take 4 mg by mouth every 6 (six) hours as needed (muscle spasms).    Historical Provider, MD  topiramate (TOPAMAX) 25 MG tablet Take 150 mg by mouth 2 (two) times daily.     Historical Provider, MD   BP 135/74 mmHg  Pulse 74  Temp(Src) 97.6 F (36.4 C) (Oral)  Resp 22  Ht 5\' 1"   (1.549 m)  Wt 119 lb (53.978 kg)  BMI 22.50 kg/m2  SpO2 100%  LMP 10/02/2012   Physical Exam  General: Well-developed, well-nourished female in no acute distress; appearance consistent with age of record HENT: normocephalic; atraumatic; edentulous Eyes: pupils equal, round and reactive to light; extraocular muscles intact Neck: supple Heart: regular rate and rhythm Lungs: clear to auscultation bilaterally Abdomen: soft; nondistended; nontender; bowel sounds present Extremities: No deformity; full range of motion; pulses normal Neurologic: Awake, alert and oriented; motor function intact in all extremities and symmetric; no facial droop Skin: Warm and dry Psychiatric: Tearful; anxious    ED Course  Procedures (including critical care time)   EKG Interpretation   Date/Time:  Saturday January 16 2015 04:56:11 EST Ventricular Rate:  65 PR Interval:  132 QRS Duration: 84 QT Interval:  392 QTC Calculation: 407 R Axis:   86 Text Interpretation:  Normal sinus rhythm with sinus arrhythmia Normal ECG  No significant change since Confirmed by Josip Merolla  MD, Jonny RuizJOHN ([id number]) on  01/16/2015 5:11:04 AM      MDM  5:56 AM Sleeping peacefully after Ativan 1 milligram IV.  Paula LibraJohn Syretta Kochel, MD 01/16/15 [phone]

## 2015-01-16 NOTE — ED Notes (Signed)
Pt states she was in bed about 2 hours ago and started feeling like heart was racing and going to jump out of her chest, felt sob, crying  Pt states this happens a lot at night

## 2015-05-05 ENCOUNTER — Encounter (HOSPITAL_BASED_OUTPATIENT_CLINIC_OR_DEPARTMENT_OTHER): Payer: Self-pay | Admitting: Emergency Medicine

## 2015-05-05 ENCOUNTER — Emergency Department (HOSPITAL_BASED_OUTPATIENT_CLINIC_OR_DEPARTMENT_OTHER)
Admission: EM | Admit: 2015-05-05 | Discharge: 2015-05-06 | Disposition: A | Discharge status: Home / Self Care | Payer: Medicaid Other | Attending: Emergency Medicine | Admitting: Emergency Medicine

## 2015-05-05 DIAGNOSIS — G2581 Restless legs syndrome: Secondary | ICD-10-CM | POA: Diagnosis not present

## 2015-05-05 DIAGNOSIS — M797 Fibromyalgia: Secondary | ICD-10-CM | POA: Insufficient documentation

## 2015-05-05 DIAGNOSIS — M62830 Muscle spasm of back: Secondary | ICD-10-CM | POA: Insufficient documentation

## 2015-05-05 DIAGNOSIS — M25552 Pain in left hip: Secondary | ICD-10-CM | POA: Diagnosis not present

## 2015-05-05 DIAGNOSIS — M549 Dorsalgia, unspecified: Secondary | ICD-10-CM | POA: Diagnosis present

## 2015-05-05 DIAGNOSIS — Z79899 Other long term (current) drug therapy: Secondary | ICD-10-CM | POA: Diagnosis not present

## 2015-05-05 DIAGNOSIS — Z8679 Personal history of other diseases of the circulatory system: Secondary | ICD-10-CM | POA: Diagnosis not present

## 2015-05-05 DIAGNOSIS — F419 Anxiety disorder, unspecified: Secondary | ICD-10-CM | POA: Insufficient documentation

## 2015-05-05 DIAGNOSIS — F4310 Post-traumatic stress disorder, unspecified: Secondary | ICD-10-CM | POA: Insufficient documentation

## 2015-05-05 DIAGNOSIS — J45909 Unspecified asthma, uncomplicated: Secondary | ICD-10-CM | POA: Diagnosis not present

## 2015-05-05 DIAGNOSIS — F17210 Nicotine dependence, cigarettes, uncomplicated: Secondary | ICD-10-CM | POA: Diagnosis not present

## 2015-05-05 DIAGNOSIS — Z8742 Personal history of other diseases of the female genital tract: Secondary | ICD-10-CM | POA: Insufficient documentation

## 2015-05-05 DIAGNOSIS — F319 Bipolar disorder, unspecified: Secondary | ICD-10-CM | POA: Diagnosis not present

## 2015-05-05 DIAGNOSIS — E119 Type 2 diabetes mellitus without complications: Secondary | ICD-10-CM | POA: Insufficient documentation

## 2015-05-05 DIAGNOSIS — M79605 Pain in left leg: Secondary | ICD-10-CM | POA: Insufficient documentation

## 2015-05-05 MED ORDER — DIAZEPAM 5 MG PO TABS
5.0000 mg | ORAL_TABLET | Freq: Once | ORAL | Status: AC
  Administered 2015-05-06: 5 mg via ORAL
  Filled 2015-05-05: qty 1

## 2015-05-05 NOTE — Discharge Instructions (Signed)
Please read and follow all provided instructions.  Your diagnoses today include:  1. Back muscle spasm    Tests performed today include:  Vital signs - see below for your results today  Medications prescribed:   Take any prescribed medications only as directed.  Home care instructions:   Follow any educational materials contained in this packet  Please rest, use ice or heat on your back for the next several days  Do not lift, push, pull anything more than 10 pounds for the next week  Follow-up instructions: Please follow-up with your primary care provider in the next 1 week for further evaluation of your symptoms.   Return instructions:  SEEK IMMEDIATE MEDICAL ATTENTION IF YOU HAVE:  New numbness, tingling, weakness, or problem with the use of your arms or legs  Severe back pain not relieved with medications  Loss control of your bowels or bladder  Increasing pain in any areas of the body (such as chest or abdominal pain)  Shortness of breath, dizziness, or fainting.   Worsening nausea (feeling sick to your stomach), vomiting, fever, or sweats  Any other emergent concerns regarding your health   Additional Information:  Your vital signs today were: BP 113/78 mmHg   Pulse 89   Temp(Src) 99 F (37.2 C) (Oral)   Resp 18   Ht  (1.549 m)   Wt 57.607 kg   BMI 24.01 kg/m2   SpO2 100%   LMP 10/02/2012 If your blood pressure (BP) was elevated above 135/85 this visit, please have this repeated by your doctor within one month. --------------

## 2015-05-05 NOTE — ED Provider Notes (Signed)
CSN: [id number]     Arrival date & time 05/05/15  2310 History   First MD Initiated Contact with Patient 05/05/15 2326     Chief Complaint  Patient presents with  . Back Pain   (Consider location/radiation/quality/duration/timing/severity/associated sxs/prior Treatment) HPI  35 y.o. female with a hx of back spasms, presents to the Emergency Department today complaining of back spasms x 4 days. Notes left hip and left leg pain. Notes pain is 8/10 and feels like an ache. No fevers. No spinal tenderness. No loss of bowl or bladder function. No numbness/tingling. Has hx of back spasms and being seen by rheumatologist for symptoms. Has tried Xanaflex with minimal relief. No CP/SOB/ABD pain. No headache. No N/V/D. No other symptoms noted.    Past Medical History  Diagnosis Date  . Migraine   . Depression   . Bipolar 1 disorder (HCC)   . Otitis media   . Strep throat   . Fibromyalgia 04/2012  . Restless leg syndrome   . Menorrhagia   . Anxiety   . Asthma     as a child- no inhaler  . Diabetes mellitus without complication (HCC)   . PTSD (post-traumatic stress disorder)    Past Surgical History  Procedure Laterality Date  . Tonsillectomy    . Cholecystectomy    . Tubal ligation    . Tympanostomy    . Laparoscopic assisted vaginal hysterectomy N/A 10/08/2012    Procedure: LAPAROSCOPIC ASSISTED VAGINAL HYSTERECTOMY;  Surgeon: Willodean Rosenthal, MD;  Location: [facility];  Service: Gynecology;  Laterality: N/A;  . Bilateral salpingectomy Bilateral 10/08/2012    Procedure: BILATERAL SALPINGECTOMY;  Surgeon: Willodean Rosenthal, MD;  Location: [facility];  Service: Gynecology;  Laterality: Bilateral;  . Abdominal hysterectomy     Family History  Problem Relation Age of Onset  . Depression Mother    Social History  Substance Use Topics  . Smoking status: Current Every Day Smoker -- 1.00 packs/day for 15 years    Types: Cigarettes  . Smokeless tobacco: Never Used  . Alcohol Use: No   OB  History    Gravida Para Term Preterm AB TAB SAB Ectopic Multiple Living   Review of Systems ROS reviewed and all are negative for acute change except as noted in the HPI.  Allergies  Morphine and related and Reglan  Home Medications   Prior to Admission medications   Medication Sig Start Date End Date Taking? Authorizing Provider  OXcarbazepine (TRILEPTAL) 150 MG tablet Take 150 mg by mouth 2 (two) times daily.   Yes Historical Provider, MD  Aspirin-Acetaminophen-Caffeine (GOODY HEADACHE PO) Take 1 packet by mouth daily as needed (aches/pain).    Historical Provider, MD  busPIRone (BUSPAR) 10 MG tablet Take 20 mg by mouth 3 (three) times daily.     Historical Provider, MD  cetirizine (ZYRTEC) 10 MG tablet Take 10 mg by mouth daily.    Historical Provider, MD  clonazePAM (KLONOPIN) 0.5 MG tablet Take 0.25 mg by mouth 2 (two) times daily as needed for anxiety.     Historical Provider, MD  cloNIDine (CATAPRES) 0.1 MG tablet Take 0.1 mg by mouth daily.    Historical Provider, MD  desvenlafaxine (PRISTIQ) 50 MG 24 hr tablet Take 50 mg by mouth daily.    Historical Provider, MD  HYDROcodone-acetaminophen (NORCO) 5-325 MG tablet Take 1-2 tablets by mouth every 6 (six) hours as needed (for pain). 12/18/14  John Molpus, MD  hydrocortisone 2.5 % cream Apply to hemorrhoids twice daily for one week. 04/01/14   John Molpus, MD  lidocaine (XYLOCAINE) 5 % ointment Apply to hemorrhoids as needed for pain 04/01/14   Paula Libra, MD  lurasidone (LATUDA) 40 MG TABS tablet Take 25 mg by mouth daily with breakfast.     Historical Provider, MD  tiZANidine (ZANAFLEX) 4 MG tablet Take 4 mg by mouth every 6 (six) hours as needed (muscle spasms).    Historical Provider, MD  topiramate (TOPAMAX) 25 MG tablet Take 150 mg by mouth 2 (two) times daily.     Historical Provider, MD  ziprasidone (GEODON) 20 MG capsule Take 20 mg by mouth 2 (two) times daily with a meal.    Historical Provider, MD    BP 113/78 mmHg  Pulse 89  Temp(Src) 99 F (37.2 C) (Oral)  Resp 18  Ht  (1.549 m)  Wt 57.607 kg  BMI 24.01 kg/m2  SpO2 100%  LMP 10/02/2012   Physical Exam  Constitutional: She is oriented to person, place, and time. She appears well-developed and well-nourished.  HENT:  Head: Normocephalic and atraumatic.  Eyes: EOM are normal. Pupils are equal, round, and reactive to light.  Neck: Normal range of motion. Neck supple.  Cardiovascular: Normal rate, regular rhythm and normal heart sounds.   Pulmonary/Chest: Effort normal and breath sounds normal. No respiratory distress. She has no wheezes. She has no rales.  Abdominal: Soft. There is no tenderness.  Musculoskeletal: Normal range of motion.       Cervical back: Normal.       Thoracic back: Normal.       Lumbar back: Normal.  Negative straight leg raise bilaterally   Neurological: She is alert and oriented to person, place, and time. She has normal strength. No cranial nerve deficit or sensory deficit.  Skin: Skin is warm and dry.  Psychiatric: She has a normal mood and affect. Her behavior is normal. Thought content normal.  Nursing note and vitals reviewed.  ED Course  Procedures (including critical care time) Labs Review Labs Reviewed - No data to display  Imaging Review No results found. I have personally reviewed and evaluated these images and lab results as part of my medical decision-making.   EKG Interpretation None      MDM  I have reviewed the relevant previous healthcare records. I obtained HPI from historian. Patient discussed with supervising physician  ED Course:  Assessment: 67y F with hx back spasms presents with back spasms x4 days. No neurological deficits appreciated. Patient is ambulatory. No warning symptoms of back pain including: fecal incontinence, urinary retention or overflow incontinence, night sweats, waking from sleep with back pain, unexplained fevers or weight loss, h/o cancer,  IVDU, recent trauma. No concern for cauda equina, epidural abscess, or other serious cause of back pain. Conservative measures such as rest, ice/heat and pain medicine indicated with PCP follow-up if no improvement with conservative management.   Disposition/Plan:  DC Home Additional Verbal discharge instructions given and discussed with patient.  Pt Instructed to f/u with PCP in the next 48-72 hours for evaluation and treatment of symptoms. Return precautions given Pt acknowledges and agrees with plan  Supervising Physician Layla Maw Ward, DO    Final diagnoses:  Back muscle spasm     Audry Pili, PA-C 05/06/15 0034  Layla Maw Ward, DO 05/06/15 [id number]

## 2015-05-05 NOTE — ED Notes (Signed)
Patient states that she is having a "fibromyalgia flair" that is effection her left hip and leg. She also reports that she is having back spasms that causing her to have increase in her anxiety. The patient is turning well in the chair to answer her phone and to talk to her daughter in triage, however shows that she has stiffness when she tries to sit down.

## 2015-05-06 MED ORDER — DIAZEPAM 5 MG PO TABS: 5.0000 mg | ORAL_TABLET | Freq: Two times a day (BID) | ORAL | Status: DC | PRN

## 2015-06-20 ENCOUNTER — Emergency Department (HOSPITAL_BASED_OUTPATIENT_CLINIC_OR_DEPARTMENT_OTHER): Payer: Medicaid Other

## 2015-06-20 ENCOUNTER — Emergency Department (HOSPITAL_BASED_OUTPATIENT_CLINIC_OR_DEPARTMENT_OTHER)
Admission: EM | Admit: 2015-06-20 | Discharge: 2015-06-20 | Disposition: A | Discharge status: Home / Self Care | Payer: Medicaid Other | Attending: Emergency Medicine | Admitting: Emergency Medicine

## 2015-06-20 ENCOUNTER — Encounter (HOSPITAL_BASED_OUTPATIENT_CLINIC_OR_DEPARTMENT_OTHER): Payer: Self-pay | Admitting: Emergency Medicine

## 2015-06-20 DIAGNOSIS — Z8709 Personal history of other diseases of the respiratory system: Secondary | ICD-10-CM | POA: Insufficient documentation

## 2015-06-20 DIAGNOSIS — F419 Anxiety disorder, unspecified: Secondary | ICD-10-CM | POA: Diagnosis not present

## 2015-06-20 DIAGNOSIS — M79602 Pain in left arm: Secondary | ICD-10-CM

## 2015-06-20 DIAGNOSIS — G43909 Migraine, unspecified, not intractable, without status migrainosus: Secondary | ICD-10-CM | POA: Insufficient documentation

## 2015-06-20 DIAGNOSIS — Z79899 Other long term (current) drug therapy: Secondary | ICD-10-CM | POA: Diagnosis not present

## 2015-06-20 DIAGNOSIS — Z8742 Personal history of other diseases of the female genital tract: Secondary | ICD-10-CM | POA: Diagnosis not present

## 2015-06-20 DIAGNOSIS — F319 Bipolar disorder, unspecified: Secondary | ICD-10-CM | POA: Diagnosis not present

## 2015-06-20 DIAGNOSIS — J45909 Unspecified asthma, uncomplicated: Secondary | ICD-10-CM | POA: Insufficient documentation

## 2015-06-20 DIAGNOSIS — F17210 Nicotine dependence, cigarettes, uncomplicated: Secondary | ICD-10-CM | POA: Insufficient documentation

## 2015-06-20 DIAGNOSIS — M25532 Pain in left wrist: Secondary | ICD-10-CM | POA: Diagnosis present

## 2015-06-20 MED ORDER — IBUPROFEN 800 MG PO TABS: 800.0000 mg | ORAL_TABLET | Freq: Three times a day (TID) | ORAL | Status: DC

## 2015-06-20 MED ORDER — IBUPROFEN 200 MG PO TABS
600.0000 mg | ORAL_TABLET | Freq: Once | ORAL | Status: AC
  Administered 2015-06-20: 600 mg via ORAL
  Filled 2015-06-20: qty 1

## 2015-06-20 NOTE — ED Provider Notes (Signed)
CSN: [id number]     Arrival date & time 06/20/15  1317 History   First MD Initiated Contact with Patient 06/20/15 1353     Chief Complaint  Patient presents with  . Wrist Pain     (Consider location/radiation/quality/duration/timing/severity/associated sxs/prior Treatment) HPI   Pt is a 35 yo female with hx of fibromyalgia, bipolar, depression who presents to the ED with left arm pain. Pt reports having intermittent sharp pain to her left wrist and forearm for the past 3 days. She notes the pain is worse with certain movements. Denies any recent fall, trauma or injury. She notes she fx her left wrist 5 years ago and reports having similar pain. Pt reports she has been taking tylenol at home without relief. Denies fever, redness, warmth, swelling, numbness, tingling, weakness.   Past Medical History  Diagnosis Date  . Migraine   . Depression   . Bipolar 1 disorder (HCC)   . Otitis media   . Strep throat   . Fibromyalgia 04/2012  . Restless leg syndrome   . Menorrhagia   . Anxiety   . Asthma     as a child- no inhaler  . PTSD (post-traumatic stress disorder)    Past Surgical History  Procedure Laterality Date  . Tonsillectomy    . Cholecystectomy    . Tubal ligation    . Tympanostomy    . Laparoscopic assisted vaginal hysterectomy N/A 10/08/2012    Procedure: LAPAROSCOPIC ASSISTED VAGINAL HYSTERECTOMY;  Surgeon: Willodean Rosenthal, MD;  Location: [facility];  Service: Gynecology;  Laterality: N/A;  . Bilateral salpingectomy Bilateral 10/08/2012    Procedure: BILATERAL SALPINGECTOMY;  Surgeon: Willodean Rosenthal, MD;  Location: [facility];  Service: Gynecology;  Laterality: Bilateral;  . Abdominal hysterectomy     Family History  Problem Relation Age of Onset  . Depression Mother    Social History  Substance Use Topics  . Smoking status: Current Every Day Smoker -- 1.00 packs/day for 15 years    Types: Cigarettes  . Smokeless tobacco: Never Used  . Alcohol Use: No   OB  History    Gravida Para Term Preterm AB TAB SAB Ectopic Multiple Living   Review of Systems  Constitutional: Negative for fever.  Musculoskeletal: Positive for arthralgias (left forearm). Negative for joint swelling.  Skin: Negative for wound.  Neurological: Negative for weakness and numbness.      Allergies  Morphine and related and Reglan  Home Medications   Prior to Admission medications   Medication Sig Start Date End Date Taking? Authorizing Provider  Aspirin-Acetaminophen-Caffeine (GOODY HEADACHE PO) Take 1 packet by mouth daily as needed (aches/pain).    Historical Provider, MD  busPIRone (BUSPAR) 10 MG tablet Take 20 mg by mouth 3 (three) times daily.     Historical Provider, MD  cetirizine (ZYRTEC) 10 MG tablet Take 10 mg by mouth daily.    Historical Provider, MD  clonazePAM (KLONOPIN) 0.5 MG tablet Take 0.25 mg by mouth 2 (two) times daily as needed for anxiety.     Historical Provider, MD  desvenlafaxine (PRISTIQ) 50 MG 24 hr tablet Take 50 mg by mouth daily.    Historical Provider, MD  diazepam (VALIUM) 5 MG tablet Take 1 tablet (5 mg total) by mouth every 12 (twelve) hours as needed for muscle spasms. 05/06/15   Audry Pili, PA-C  hydrocortisone 2.5 % cream Apply to hemorrhoids twice daily for one week. 04/01/14  Paula LibraJohn Molpus, MD  ibuprofen (ADVIL,MOTRIN) 800 MG tablet Take 1 tablet (800 mg total) by mouth 3 (three) times daily. 06/20/15   Barrett HenleNicole Elizabeth Nadeau, PA-C  lidocaine (XYLOCAINE) 5 % ointment Apply to hemorrhoids as needed for pain 04/01/14   Paula LibraJohn Molpus, MD  OXcarbazepine (TRILEPTAL) 150 MG tablet Take 150 mg by mouth 2 (two) times daily.    Historical Provider, MD  phenazopyridine (PYRIDIUM) 200 MG tablet Take 1 tablet (200 mg total) by mouth 3 (three) times daily. 06/21/15   Shawn C Joy, PA-C  sulfamethoxazole-trimethoprim (BACTRIM DS,SEPTRA DS) 800-160 MG tablet Take 1 tablet by mouth 2 (two) times daily. 06/21/15 06/28/15  Shawn C Joy, PA-C   tiZANidine (ZANAFLEX) 4 MG tablet Take 4 mg by mouth every 6 (six) hours as needed (muscle spasms).    Historical Provider, MD  topiramate (TOPAMAX) 25 MG tablet Take 150 mg by mouth 2 (two) times daily.     Historical Provider, MD   BP 112/62 mmHg  Pulse 60  Temp(Src) 98.1 F (36.7 C) (Oral)  Resp 16  Ht 5\' 1"  (1.549 m)  Wt 53.933 kg  BMI 22.48 kg/m2  SpO2 100%  LMP 10/02/2012 Physical Exam  Constitutional: She is oriented to person, place, and time. She appears well-developed and well-nourished. No distress.  HENT:  Head: Normocephalic and atraumatic.  Eyes: Conjunctivae and EOM are normal. Right eye exhibits no discharge. Left eye exhibits no discharge. No scleral icterus.  Neck: Normal range of motion. Neck supple.  Pulmonary/Chest: Effort normal.  Musculoskeletal: Normal range of motion.       Left shoulder: She exhibits normal range of motion, no tenderness, no bony tenderness, no swelling, no effusion, no crepitus, no deformity, no laceration, no pain, no spasm, normal pulse and normal strength.       Left elbow: She exhibits normal range of motion, no swelling, no effusion, no deformity and no laceration. No tenderness found.       Left wrist: She exhibits normal range of motion, no tenderness, no bony tenderness, no swelling, no effusion, no crepitus, no deformity and no laceration.       Left forearm: She exhibits no tenderness, no bony tenderness, no swelling, no edema, no deformity and no laceration.  2+ radial pulses. Cap refill <2. Sensation grossly intact. 5/5 strength of BUE. Pt reports pain against resistance with extension of left wrist, pain radiates from elbow to hand.   Neurological: She is alert and oriented to person, place, and time.  Skin: Skin is warm and dry. She is not diaphoretic. No erythema.  Nursing note and vitals reviewed.   ED Course  Procedures (including critical care time) Labs Review Labs Reviewed - No data to display  Imaging Review Dg  Forearm Left  06/20/2015  CLINICAL DATA:  Left forearm pain for 3 days.  No reported injury. EXAM: LEFT FOREARM - 2 VIEW COMPARISON:  10/25/2014 left wrist radiographs. FINDINGS: There is no evidence of fracture or other focal bone lesions. Soft tissues are unremarkable. IMPRESSION: Negative. Electronically Signed   By: Delbert PhenixJason A Poff M.D.   On: 06/20/2015 14:39   I have personally reviewed and evaluated these images and lab results as part of my medical decision-making.   EKG Interpretation None      MDM   Final diagnoses:  Left arm pain    Pt presents with left forearm/wrist pain. Denies any recent injury. VSS. Exam revealed pain with extension of left wrist, remaining exam unremarkable. Left UE neurovascularly intact.  Left forearm xray negative. I suspect pt's sxs are likely due to lateral epicondylitis. Discussed results and plan for d/c with pt. Discussed symptomatic tx and advised pt to take NSAIDs. Pt given ortho follow up.  Evaluation does not show pathology requring ongoing emergent intervention or admission. Pt is hemodynamically stable and mentating appropriately. Discussed findings/results and plan with patient/guardian, who agrees with plan. All questions answered. Return precautions discussed and outpatient follow up given.      Satira Sark [place], [place] 06/21/15 1314  Jerelyn Scott, MD 06/21/15 1504

## 2015-06-20 NOTE — ED Notes (Signed)
Pt in c/o L wrist pain onset 3 days ago, denies recent injury but states same pain when she had a fracture many years ago.

## 2015-06-20 NOTE — Discharge Instructions (Signed)
Take your medications as prescribed. Recommend eating prior to taking her ibuprofen to prevent GI side effects. Apply ice to affected area for 15-20 minutes 3-4 times daily. I also recommend using a brace which applys pressure/support to your outer elbow used for "tennis elbow". Follow-up with the orthopedic clinic listed above if your symptoms have not improved over the next week. Please return to the Emergency Department if symptoms worsen or new onset of fever, redness, swelling, warmth, numbness, tingling, weakness.

## 2015-06-21 ENCOUNTER — Encounter (HOSPITAL_BASED_OUTPATIENT_CLINIC_OR_DEPARTMENT_OTHER): Payer: Self-pay | Admitting: *Deleted

## 2015-06-21 ENCOUNTER — Emergency Department (HOSPITAL_BASED_OUTPATIENT_CLINIC_OR_DEPARTMENT_OTHER)
Admission: EM | Admit: 2015-06-21 | Discharge: 2015-06-21 | Disposition: A | Discharge status: Home / Self Care | Payer: Medicaid Other | Attending: Emergency Medicine | Admitting: Emergency Medicine

## 2015-06-21 DIAGNOSIS — F419 Anxiety disorder, unspecified: Secondary | ICD-10-CM | POA: Diagnosis not present

## 2015-06-21 DIAGNOSIS — F319 Bipolar disorder, unspecified: Secondary | ICD-10-CM | POA: Diagnosis not present

## 2015-06-21 DIAGNOSIS — Z8742 Personal history of other diseases of the female genital tract: Secondary | ICD-10-CM | POA: Insufficient documentation

## 2015-06-21 DIAGNOSIS — G43909 Migraine, unspecified, not intractable, without status migrainosus: Secondary | ICD-10-CM | POA: Diagnosis not present

## 2015-06-21 DIAGNOSIS — Z79899 Other long term (current) drug therapy: Secondary | ICD-10-CM | POA: Insufficient documentation

## 2015-06-21 DIAGNOSIS — Z7952 Long term (current) use of systemic steroids: Secondary | ICD-10-CM | POA: Insufficient documentation

## 2015-06-21 DIAGNOSIS — J45909 Unspecified asthma, uncomplicated: Secondary | ICD-10-CM | POA: Diagnosis not present

## 2015-06-21 DIAGNOSIS — R300 Dysuria: Secondary | ICD-10-CM | POA: Diagnosis present

## 2015-06-21 DIAGNOSIS — Z8669 Personal history of other diseases of the nervous system and sense organs: Secondary | ICD-10-CM | POA: Insufficient documentation

## 2015-06-21 DIAGNOSIS — N3000 Acute cystitis without hematuria: Secondary | ICD-10-CM | POA: Insufficient documentation

## 2015-06-21 DIAGNOSIS — F4310 Post-traumatic stress disorder, unspecified: Secondary | ICD-10-CM | POA: Insufficient documentation

## 2015-06-21 DIAGNOSIS — F17210 Nicotine dependence, cigarettes, uncomplicated: Secondary | ICD-10-CM | POA: Diagnosis not present

## 2015-06-21 LAB — URINE MICROSCOPIC-ADD ON

## 2015-06-21 LAB — URINALYSIS, ROUTINE W REFLEX MICROSCOPIC
Glucose, UA: NEGATIVE mg/dL
HGB URINE DIPSTICK: NEGATIVE
KETONES UR: NEGATIVE mg/dL
Nitrite: NEGATIVE
Protein, ur: NEGATIVE mg/dL
Specific Gravity, Urine: 1.035 — ABNORMAL HIGH (ref 1.005–1.030)
pH: 5.5 (ref 5.0–8.0)

## 2015-06-21 MED ORDER — SULFAMETHOXAZOLE-TRIMETHOPRIM 800-160 MG PO TABS: 1.0000 | ORAL_TABLET | Freq: Two times a day (BID) | ORAL | Status: AC

## 2015-06-21 MED ORDER — IBUPROFEN 800 MG PO TABS
800.0000 mg | ORAL_TABLET | Freq: Once | ORAL | Status: DC
  Filled 2015-06-21: qty 1

## 2015-06-21 MED ORDER — PHENAZOPYRIDINE HCL 200 MG PO TABS: 200.0000 mg | ORAL_TABLET | Freq: Three times a day (TID) | ORAL | Status: DC

## 2015-06-21 MED FILL — SULFAMETHOXAZOLE-TMP DS TAB: 800-160 | 3 days supply | Qty: 6 | Fill #0

## 2015-06-21 NOTE — Discharge Instructions (Signed)
You have been seen today for urinary discomfort. Your imaging and lab tests showed no abnormalities. Please visit your PCP for any future issues of this nature. Return to ED should symptoms worsen. May use Tylenol, naproxen, or ibuprofen for pain.

## 2015-06-21 NOTE — ED Provider Notes (Signed)
CSN: [id number]     Arrival date & time 06/21/15  [id number] History   First MD Initiated Contact with Patient 06/21/15 [phone]     No chief complaint on file.    (Consider location/radiation/quality/duration/timing/severity/associated sxs/prior Treatment) HPI   Tiffany Whitney is a 35 y.o. female, with a history of bipolar, fibromyalgia, hysterectomy, and depression, presenting to the ED with lower pelvic pain and dysuria that began around 5 am this morning, however, patient currently only endorses pain with urination. Pt rates her pain at 9/10, shooting, radiating from her vulva/vagina/urethra area into her pelvis. Pt states, "The last time I had this problem I had an UTI." Pt took a Goody powder without relief. Pt denies fever/chills, N/V/C/D, vaginal discharge/bleeding, abdominal pain, or any other complaints.     Past Medical History  Diagnosis Date  . Migraine   . Depression   . Bipolar 1 disorder (HCC)   . Otitis media   . Strep throat   . Fibromyalgia 04/2012  . Restless leg syndrome   . Menorrhagia   . Anxiety   . Asthma     as a child- no inhaler  . PTSD (post-traumatic stress disorder)    Past Surgical History  Procedure Laterality Date  . Tonsillectomy    . Cholecystectomy    . Tubal ligation    . Tympanostomy    . Laparoscopic assisted vaginal hysterectomy N/A 10/08/2012    Procedure: LAPAROSCOPIC ASSISTED VAGINAL HYSTERECTOMY;  Surgeon: Willodean Rosenthalarolyn Harraway-Smith, MD;  Location: [facility];  Service: Gynecology;  Laterality: N/A;  . Bilateral salpingectomy Bilateral 10/08/2012    Procedure: BILATERAL SALPINGECTOMY;  Surgeon: Willodean Rosenthalarolyn Harraway-Smith, MD;  Location: [facility];  Service: Gynecology;  Laterality: Bilateral;  . Abdominal hysterectomy     Family History  Problem Relation Age of Onset  . Depression Mother    Social History  Substance Use Topics  . Smoking status: Current Every Day Smoker -- 1.00 packs/day for 15 years    Types: Cigarettes  . Smokeless tobacco: Never Used   . Alcohol Use: No   OB History    Gravida Para Term Preterm AB TAB SAB Ectopic Multiple Living   6 3 2 1 3  3   3      Review of Systems  Constitutional: Negative for fever, chills and diaphoresis.  Gastrointestinal: Negative for nausea and vomiting.  Genitourinary: Positive for dysuria, urgency, difficulty urinating and pelvic pain (resolved). Negative for hematuria.  Musculoskeletal: Negative for back pain.  Skin: Negative for color change and pallor.  All other systems reviewed and are negative.     Allergies  Morphine and related and Reglan  Home Medications   Prior to Admission medications   Medication Sig Start Date End Date Taking? Authorizing Provider  Aspirin-Acetaminophen-Caffeine (GOODY HEADACHE PO) Take 1 packet by mouth daily as needed (aches/pain).   Yes Historical Provider, MD  busPIRone (BUSPAR) 10 MG tablet Take 20 mg by mouth 3 (three) times daily.    Yes Historical Provider, MD  cetirizine (ZYRTEC) 10 MG tablet Take 10 mg by mouth daily.   Yes Historical Provider, MD  clonazePAM (KLONOPIN) 0.5 MG tablet Take 0.25 mg by mouth 2 (two) times daily as needed for anxiety.    Yes Historical Provider, MD  desvenlafaxine (PRISTIQ) 50 MG 24 hr tablet Take 50 mg by mouth daily.   Yes Historical Provider, MD  diazepam (VALIUM) 5 MG tablet Take 1 tablet (5 mg total) by mouth every 12 (twelve) hours as needed for muscle spasms. 05/06/15  Yes Audry Pili, PA-C  hydrocortisone 2.5 % cream Apply to hemorrhoids twice daily for one week. 04/01/14  Yes John Molpus, MD  ibuprofen (ADVIL,MOTRIN) 800 MG tablet Take 1 tablet (800 mg total) by mouth 3 (three) times daily. 06/20/15  Yes Barrett Henle, PA-C  lidocaine (XYLOCAINE) 5 % ointment Apply to hemorrhoids as needed for pain 04/01/14  Yes John Molpus, MD  OXcarbazepine (TRILEPTAL) 150 MG tablet Take 150 mg by mouth 2 (two) times daily.   Yes Historical Provider, MD  tiZANidine (ZANAFLEX) 4 MG tablet Take 4 mg by mouth every 6  (six) hours as needed (muscle spasms).   Yes Historical Provider, MD  topiramate (TOPAMAX) 25 MG tablet Take 150 mg by mouth 2 (two) times daily.    Yes Historical Provider, MD  phenazopyridine (PYRIDIUM) 200 MG tablet Take 1 tablet (200 mg total) by mouth 3 (three) times daily. 06/21/15   Brodric Schauer C Eldred Lievanos, PA-C  sulfamethoxazole-trimethoprim (BACTRIM DS,SEPTRA DS) 800-160 MG tablet Take 1 tablet by mouth 2 (two) times daily. 06/21/15 06/28/15  Ayza Ripoll C Sahithi Ordoyne, PA-C   BP 123/64 mmHg  Pulse 62  Temp(Src) 99.1 F (37.3 C) (Oral)  Resp 16  SpO2 100%  LMP 10/02/2012 Physical Exam  Constitutional: She appears well-developed and well-nourished. No distress.  HENT:  Head: Normocephalic and atraumatic.  Eyes: Conjunctivae are normal. Pupils are equal, round, and reactive to light.  Neck: Neck supple.  Cardiovascular: Normal rate, regular rhythm, normal heart sounds and intact distal pulses.   Pulmonary/Chest: Effort normal and breath sounds normal. No respiratory distress.  Abdominal: Soft. There is no tenderness. There is no guarding.  Musculoskeletal: She exhibits no edema or tenderness.  Lymphadenopathy:    She has no cervical adenopathy.  Neurological: She is alert.  Skin: Skin is warm and dry. She is not diaphoretic.  Psychiatric: She has a normal mood and affect. Her behavior is normal.  Nursing note and vitals reviewed.   ED Course  Procedures (including critical care time) Labs Review Labs Reviewed  URINALYSIS, ROUTINE W REFLEX MICROSCOPIC (NOT AT [facility]) - Abnormal; Notable for the following:    APPearance CLOUDY (*)    Specific Gravity, Urine 1.035 (*)    Bilirubin Urine SMALL (*)    Leukocytes, UA SMALL (*)    All other components within normal limits  URINE MICROSCOPIC-ADD ON - Abnormal; Notable for the following:    Squamous Epithelial / LPF 6-30 (*)    Bacteria, UA MANY (*)    Crystals CA OXALATE CRYSTALS (*)    All other components within normal limits  URINE CULTURE     Imaging Review Dg Forearm Left  06/20/2015  CLINICAL DATA:  Left forearm pain for 3 days.  No reported injury. EXAM: LEFT FOREARM - 2 VIEW COMPARISON:  10/25/2014 left wrist radiographs. FINDINGS: There is no evidence of fracture or other focal bone lesions. Soft tissues are unremarkable. IMPRESSION: Negative. Electronically Signed   By: Delbert Phenix M.D.   On: 06/20/2015 14:39   I have personally reviewed and evaluated these lab results as part of my medical decision-making.   EKG Interpretation None      MDM   Final diagnoses:  Dysuria  Acute cystitis without hematuria    Sirena Riddle presents with dysuria, urgency, and lower pelvic pain that began this morning.  Patient is afebrile, nontoxic appearing, not tachycardic, is normotensive, has a benign abdominal exam, and is in no apparent distress. UA shows evidence of UTI. Antibiotic prescribed. Patient advised  to visit her PCP for any future complaints of this nature. Return precautions discussed. Patient voiced understanding of these instructions and is comfortable with discharge.  Filed Vitals:   06/21/15 0857  BP: 123/64  Pulse: 62  Temp: 99.1 F (37.3 C)  TempSrc: Oral  Resp: 16  SpO2: 100%     Anselm Pancoast, PA-C 06/21/15 0930  Geoffery Lyons, MD 06/21/15 [phone]

## 2015-06-21 NOTE — ED Notes (Signed)
PT refused ibuprofen, states it is not strong enough to relieve her pain. PA aware. Pt offered tylenol and pt refused.

## 2015-06-21 NOTE — ED Notes (Signed)
C/o pain in mid abd and and painful urination and feels unable to urinate. Onset 5 am this morning. No vaginal discharge.

## 2015-06-23 LAB — URINE CULTURE

## 2015-06-24 ENCOUNTER — Telehealth: Payer: Self-pay | Admitting: *Deleted

## 2015-06-24 NOTE — ED Notes (Signed)
Post ED Visit - Positive Culture Follow-up  Culture report reviewed by antimicrobial stewardship pharmacist:  []  Enzo BiNathan Batchelder, Pharm.D. []  Celedonio MiyamotoJeremy Frens, Pharm.D., BCPS []  Garvin FilaMike Maccia, Pharm.D. []  Georgina PillionElizabeth Martin, Pharm.D., BCPS []  CamdentonMinh Pham, [address]Ph[address]D., BCPS, AAHIVP [x]  Estella HuskMichelle Turner, Pharm.D., BCPS, AAHIVP []  Tennis Mustassie Stewart, Pharm.D. []  Sherle Poeob Vincent, [address]Pharm[address]  Positive urine culture Treated with Sulfametoxazole-Trimethoprim, organism sensitive to the same and no further patient follow-up is required at this time.  Virl AxeRobertson, Rashaun Wichert [facility] 06/24/2015, 11:24 AM

## 2015-08-14 ENCOUNTER — Emergency Department (HOSPITAL_BASED_OUTPATIENT_CLINIC_OR_DEPARTMENT_OTHER)
Admission: EM | Admit: 2015-08-14 | Discharge: 2015-08-15 | Disposition: A | Discharge status: Home / Self Care | Payer: Medicaid Other | Attending: Emergency Medicine | Admitting: Emergency Medicine

## 2015-08-14 ENCOUNTER — Encounter (HOSPITAL_BASED_OUTPATIENT_CLINIC_OR_DEPARTMENT_OTHER): Payer: Self-pay | Admitting: *Deleted

## 2015-08-14 DIAGNOSIS — Z7982 Long term (current) use of aspirin: Secondary | ICD-10-CM | POA: Insufficient documentation

## 2015-08-14 DIAGNOSIS — F329 Major depressive disorder, single episode, unspecified: Secondary | ICD-10-CM | POA: Insufficient documentation

## 2015-08-14 DIAGNOSIS — J45909 Unspecified asthma, uncomplicated: Secondary | ICD-10-CM | POA: Insufficient documentation

## 2015-08-14 DIAGNOSIS — J069 Acute upper respiratory infection, unspecified: Secondary | ICD-10-CM

## 2015-08-14 DIAGNOSIS — F17210 Nicotine dependence, cigarettes, uncomplicated: Secondary | ICD-10-CM | POA: Insufficient documentation

## 2015-08-14 DIAGNOSIS — R059 Cough, unspecified: Secondary | ICD-10-CM

## 2015-08-14 DIAGNOSIS — R0981 Nasal congestion: Secondary | ICD-10-CM

## 2015-08-14 DIAGNOSIS — R05 Cough: Secondary | ICD-10-CM | POA: Diagnosis present

## 2015-08-14 DIAGNOSIS — J3489 Other specified disorders of nose and nasal sinuses: Secondary | ICD-10-CM

## 2015-08-14 NOTE — ED Notes (Signed)
Pt reports cough, nasal congestion and sinus pressure x 2 weeks.  Denies fever.

## 2015-08-14 NOTE — ED Provider Notes (Signed)
CSN: [id number]     Arrival date & time 08/14/15  2330 History   First MD Initiated Contact with Patient 08/14/15 2339     Chief Complaint  Patient presents with  . URI     (Consider location/radiation/quality/duration/timing/severity/associated sxs/prior Treatment) HPI Comments: Patient is a 35 year old female who presents with cough, nasal congestion, facial pain, and shortness of breath. Patient reports she had a cold for one week. Patient began with a productive cough 1 week ago. She then began with facial pain and increasing shortness of breath with exertion and laying down 3 days ago. She came in today because for shortness of breath is getting worse. Patient has no known fevers, however she has been feeling hot and cold. Patient has tried Alka-Seltzer, Benadryl, fluticasone at home without much relief. Patient denies any chest pain, abdominal pain, nausea, vomiting, dysuria.  Patient is a 35 y.o. female presenting with URI. The history is provided by the patient.  URI Presenting symptoms: congestion, cough and rhinorrhea   Presenting symptoms: no fever and no sore throat   Associated symptoms: no headaches and no wheezing     Past Medical History  Diagnosis Date  . Migraine   . Depression   . Bipolar 1 disorder (HCC)   . Otitis media   . Strep throat   . Fibromyalgia 04/2012  . Restless leg syndrome   . Menorrhagia   . Anxiety   . Asthma     as a child- no inhaler  . PTSD (post-traumatic stress disorder)    Past Surgical History  Procedure Laterality Date  . Tonsillectomy    . Cholecystectomy    . Tubal ligation    . Tympanostomy    . Laparoscopic assisted vaginal hysterectomy N/A 10/08/2012    Procedure: LAPAROSCOPIC ASSISTED VAGINAL HYSTERECTOMY;  Surgeon: Willodean Rosenthal, MD;  Location: [facility];  Service: Gynecology;  Laterality: N/A;  . Bilateral salpingectomy Bilateral 10/08/2012    Procedure: BILATERAL SALPINGECTOMY;  Surgeon: Willodean Rosenthal, MD;   Location: [facility];  Service: Gynecology;  Laterality: Bilateral;  . Abdominal hysterectomy     Family History  Problem Relation Age of Onset  . Depression Mother    Social History  Substance Use Topics  . Smoking status: Current Every Day Smoker -- 1.00 packs/day for 15 years    Types: Cigarettes  . Smokeless tobacco: Never Used  . Alcohol Use: No   OB History    Gravida Para Term Preterm AB TAB SAB Ectopic Multiple Living   Review of Systems  Constitutional: Negative for fever and chills.  HENT: Positive for congestion, rhinorrhea and sinus pressure. Negative for facial swelling and sore throat.   Respiratory: Positive for cough and shortness of breath. Negative for wheezing and stridor.   Cardiovascular: Negative for chest pain.  Gastrointestinal: Negative for nausea, vomiting and abdominal pain.  Genitourinary: Negative for dysuria.  Musculoskeletal: Negative for back pain.  Skin: Negative for rash and wound.  Neurological: Negative for headaches.  Psychiatric/Behavioral: The patient is not nervous/anxious.       Allergies  Morphine and related and Reglan  Home Medications   Prior to Admission medications   Medication Sig Start Date End Date Taking? Authorizing Provider  amoxicillin-clavulanate (AUGMENTIN) 875-125 MG tablet Take 1 tablet by mouth every 12 (twelve) hours. 08/15/15   Emi Holes, PA-C  Aspirin-Acetaminophen-Caffeine (GOODY HEADACHE PO) Take 1 packet by mouth daily as needed (aches/pain).  Historical Provider, MD  benzonatate (TESSALON) 100 MG capsule Take 1 capsule (100 mg total) by mouth every 8 (eight) hours. 08/15/15   Emi Holes, PA-C  busPIRone (BUSPAR) 10 MG tablet Take 20 mg by mouth 3 (three) times daily.     Historical Provider, MD  cetirizine (ZYRTEC) 10 MG tablet Take 10 mg by mouth daily.    Historical Provider, MD  cetirizine-pseudoephedrine (ZYRTEC-D) 5-120 MG tablet Take 1 tablet by mouth 2 (two) times daily.  08/15/15   Emi Holes, PA-C  clonazePAM (KLONOPIN) 0.5 MG tablet Take 0.25 mg by mouth 2 (two) times daily as needed for anxiety.     Historical Provider, MD  desvenlafaxine (PRISTIQ) 50 MG 24 hr tablet Take 50 mg by mouth daily.    Historical Provider, MD  diazepam (VALIUM) 5 MG tablet Take 1 tablet (5 mg total) by mouth every 12 (twelve) hours as needed for muscle spasms. 05/06/15   Audry Pili, PA-C  hydrocortisone 2.5 % cream Apply to hemorrhoids twice daily for one week. 04/01/14   John Molpus, MD  ibuprofen (ADVIL,MOTRIN) 800 MG tablet Take 1 tablet (800 mg total) by mouth 3 (three) times daily. 06/20/15   Barrett Henle, PA-C  lidocaine (XYLOCAINE) 5 % ointment Apply to hemorrhoids as needed for pain 04/01/14   Paula Libra, MD  OXcarbazepine (TRILEPTAL) 150 MG tablet Take 150 mg by mouth 2 (two) times daily.    Historical Provider, MD  tiZANidine (ZANAFLEX) 4 MG tablet Take 4 mg by mouth every 6 (six) hours as needed (muscle spasms).    Historical Provider, MD  topiramate (TOPAMAX) 25 MG tablet Take 150 mg by mouth 2 (two) times daily.     Historical Provider, MD   BP 105/76 mmHg  Pulse 108  Temp(Src) 98.7 F (37.1 C) (Oral)  Resp 18  Ht 5\' 1"  (1.549 m)  Wt 54.432 kg  BMI 22.69 kg/m2  SpO2 96%  LMP 10/02/2012 Physical Exam  Constitutional: She appears well-developed and well-nourished. No distress.  HENT:  Head: Normocephalic and atraumatic.    Mouth/Throat: Oropharynx is clear and moist. No oropharyngeal exudate.  Facial tenderness on palpation to maxillary sinus, minimal transillumination  Eyes: Conjunctivae are normal. Pupils are equal, round, and reactive to light. Right eye exhibits no discharge. Left eye exhibits no discharge. No scleral icterus.  Neck: Normal range of motion. Neck supple. No thyromegaly present.  Cardiovascular: Normal rate, regular rhythm, normal heart sounds and intact distal pulses.  Exam reveals no gallop and no friction rub.   No murmur  heard. Pulmonary/Chest: Effort normal and breath sounds normal. No stridor. No respiratory distress. She has no wheezes. She has no rales.  Abdominal: Soft. Bowel sounds are normal. She exhibits no distension. There is no tenderness. There is no rebound and no guarding.  Musculoskeletal: She exhibits no edema.  Lymphadenopathy:    She has no cervical adenopathy.  Neurological: She is alert. Coordination normal.  Skin: Skin is warm and dry. No rash noted. She is not diaphoretic. No pallor.  Psychiatric: She has a normal mood and affect.  Nursing note and vitals reviewed.   ED Course  Procedures (including critical care time) Labs Review Labs Reviewed - No data to display  Imaging Review Dg Chest 2 View  08/15/2015  CLINICAL DATA:  35 year old female with shortness of breath and cough EXAM: CHEST  2 VIEW COMPARISON:  Chest radiograph dated 10/25/2014 FINDINGS: The heart size and mediastinal contours are within normal limits. Both lungs  are clear. The visualized skeletal structures are unremarkable. IMPRESSION: No active cardiopulmonary disease. Electronically Signed   By: Elgie CollardArash  Radparvar M.D.   On: 08/15/2015 00:43   I have personally reviewed and evaluated these images and lab results as part of my medical decision-making.   EKG Interpretation None      MDM   CXR shows no active cardiopulmonary disease.  Patient ambulating with O2 sats over 98%. I suspect secondary bacterial infection and I will treat with Augmentin, Zyrtec-D, Tessalon, albuterol. Patient given smoking cessation counseling, as I believe this is a large contributor of her shortness of breath. Patient advised to follow-up with her PCP if her symptoms persist. Strict return precautions given. Patient vitals stable throughout ED course and discharged in satisfactory condition.  Final diagnoses:  Upper respiratory infection  Cough  Sinus pressure  Nasal congestion       Emi Holeslexandra M Mystie Ormand, PA-C 08/15/15  0102  Shon Batonourtney F Horton, MD 08/15/15 [phone]

## 2015-08-15 ENCOUNTER — Emergency Department (HOSPITAL_BASED_OUTPATIENT_CLINIC_OR_DEPARTMENT_OTHER): Payer: Medicaid Other

## 2015-08-15 MED ORDER — AMOXICILLIN-POT CLAVULANATE 875-125 MG PO TABS: 1.0000 | ORAL_TABLET | Freq: Two times a day (BID) | ORAL | Status: DC

## 2015-08-15 MED ORDER — CETIRIZINE-PSEUDOEPHEDRINE ER 5-120 MG PO TB12: 1.0000 | ORAL_TABLET | Freq: Two times a day (BID) | ORAL | Status: DC

## 2015-08-15 MED ORDER — BENZONATATE 100 MG PO CAPS: 100.0000 mg | ORAL_CAPSULE | Freq: Three times a day (TID) | ORAL | Status: DC

## 2015-08-15 MED ORDER — ALBUTEROL SULFATE HFA 108 (90 BASE) MCG/ACT IN AERS
1.0000 | INHALATION_SPRAY | RESPIRATORY_TRACT | Status: DC
  Administered 2015-08-15: 2 via RESPIRATORY_TRACT
  Filled 2015-08-15: qty 6.7

## 2015-08-15 NOTE — ED Notes (Signed)
Pt ambulated around NS with steady gait in NAD. Ox sat 98-100% and pulse 100-105.

## 2015-08-15 NOTE — Discharge Instructions (Signed)
Medications: Augmentin, Zyrtec-D, Tessalon, albuterol inhaler  Treatment: Take Augmentin twice daily as prescribed for 1 week. Take Zyrtec-D twice daily as prescribed for your nasal congestion. Take Tessalon every 8 hours as needed for your cough. Use albuterol inhaler every 4 hours as needed for shortness of breath. Drink plenty of water and get plenty of rest. Quitting smoking will help your overall health and help prevent you from getting cough and shortness of breath such as this in the future.  Follow-up: Please follow-up with your primary care provider if your symptoms are persisting. Please return to emergency department if you develop any new or worsening symptoms.

## 2015-08-15 NOTE — ED Notes (Signed)
Pt given d/c instructions as per chart. Rx x 3. Verbalizes understanding. No questions. 

## 2016-07-17 ENCOUNTER — Emergency Department (HOSPITAL_BASED_OUTPATIENT_CLINIC_OR_DEPARTMENT_OTHER)
Admission: EM | Admit: 2016-07-17 | Discharge: 2016-07-17 | Disposition: A | Discharge status: Home / Self Care | Payer: Medicaid Other | Attending: Emergency Medicine | Admitting: Emergency Medicine

## 2016-07-17 ENCOUNTER — Emergency Department (HOSPITAL_BASED_OUTPATIENT_CLINIC_OR_DEPARTMENT_OTHER): Payer: Medicaid Other

## 2016-07-17 ENCOUNTER — Encounter (HOSPITAL_BASED_OUTPATIENT_CLINIC_OR_DEPARTMENT_OTHER): Payer: Self-pay

## 2016-07-17 DIAGNOSIS — B9689 Other specified bacterial agents as the cause of diseases classified elsewhere: Secondary | ICD-10-CM

## 2016-07-17 DIAGNOSIS — N760 Acute vaginitis: Secondary | ICD-10-CM | POA: Insufficient documentation

## 2016-07-17 DIAGNOSIS — R1031 Right lower quadrant pain: Secondary | ICD-10-CM | POA: Diagnosis present

## 2016-07-17 DIAGNOSIS — F17210 Nicotine dependence, cigarettes, uncomplicated: Secondary | ICD-10-CM | POA: Insufficient documentation

## 2016-07-17 DIAGNOSIS — Z79899 Other long term (current) drug therapy: Secondary | ICD-10-CM | POA: Insufficient documentation

## 2016-07-17 DIAGNOSIS — J45909 Unspecified asthma, uncomplicated: Secondary | ICD-10-CM | POA: Diagnosis not present

## 2016-07-17 HISTORY — DX: Dizziness and giddiness: R42

## 2016-07-17 HISTORY — DX: Borderline personality disorder: F60.3

## 2016-07-17 LAB — COMPREHENSIVE METABOLIC PANEL
ALT: 12 U/L — ABNORMAL LOW (ref 14–54)
AST: 14 U/L — ABNORMAL LOW (ref 15–41)
Albumin: 4 g/dL (ref 3.5–5.0)
Alkaline Phosphatase: 39 U/L (ref 38–126)
Anion gap: 8 (ref 5–15)
BUN: 19 mg/dL (ref 6–20)
CO2: 26 mmol/L (ref 22–32)
Calcium: 9 mg/dL (ref 8.9–10.3)
Chloride: 106 mmol/L (ref 101–111)
Creatinine, Ser: 1 mg/dL (ref 0.44–1.00)
GFR calc Af Amer: >60 mL/min (ref >60)
GFR calc non Af Amer: >60 mL/min (ref >60)
Glucose, Bld: 95 mg/dL (ref 65–99)
Potassium: 3.8 mmol/L (ref 3.5–5.1)
Sodium: 140 mmol/L (ref 135–145)
Total Bilirubin: 0.3 mg/dL (ref 0.3–1.2)
Total Protein: 6.6 g/dL (ref 6.5–8.1)

## 2016-07-17 LAB — CBC WITH DIFFERENTIAL/PLATELET
Basophils Absolute: 0.1 10*3/uL (ref 0.0–0.1)
Basophils Relative: 1 %
Eosinophils Absolute: 0.6 10*3/uL (ref 0.0–0.7)
Eosinophils Relative: 7 %
HCT: 40.9 % (ref 36.0–46.0)
Hemoglobin: 14.5 g/dL (ref 12.0–15.0)
Lymphocytes Relative: 22 %
Lymphs Abs: 2 10*3/uL (ref 0.7–4.0)
MCH: 32.5 pg (ref 26.0–34.0)
MCHC: 35.5 g/dL (ref 30.0–36.0)
MCV: 91.7 fL (ref 78.0–100.0)
Monocytes Absolute: 0.6 10*3/uL (ref 0.1–1.0)
Monocytes Relative: 7 %
Neutro Abs: 5.8 10*3/uL (ref 1.7–7.7)
Neutrophils Relative %: 63 %
Platelets: 256 10*3/uL (ref 150–400)
RBC: 4.46 MIL/uL (ref 3.87–5.11)
RDW: 11.8 % (ref 11.5–15.5)
WBC: 9 10*3/uL (ref 4.0–10.5)

## 2016-07-17 LAB — WET PREP, GENITAL
Sperm: NONE SEEN
Trich, Wet Prep: NONE SEEN
Yeast Wet Prep HPF POC: NONE SEEN

## 2016-07-17 LAB — URINALYSIS, ROUTINE W REFLEX MICROSCOPIC
Bilirubin Urine: NEGATIVE
Glucose, UA: NEGATIVE mg/dL
HGB URINE DIPSTICK: NEGATIVE
Ketones, ur: NEGATIVE mg/dL
Leukocytes, UA: NEGATIVE
Nitrite: NEGATIVE
Protein, ur: NEGATIVE mg/dL
SPECIFIC GRAVITY, URINE: 1.027 (ref 1.005–1.030)
pH: 5.5 (ref 5.0–8.0)

## 2016-07-17 LAB — PREGNANCY, URINE: Preg Test, Ur: NEGATIVE

## 2016-07-17 MED ORDER — IOPAMIDOL (ISOVUE-300) INJECTION 61%
100.0000 mL | Freq: Once | INTRAVENOUS | Status: AC | PRN
  Administered 2016-07-17: 100 mL via INTRAVENOUS

## 2016-07-17 MED ORDER — METRONIDAZOLE 500 MG PO TABS: 500.0000 mg | ORAL_TABLET | Freq: Two times a day (BID) | ORAL | 0 refills | Status: AC

## 2016-07-17 MED FILL — metroNIDAZOLE 500 MG TABS: 500 | 7 days supply | Qty: 14 | Fill #0

## 2016-07-17 NOTE — ED Triage Notes (Signed)
c/o urinary sx x 2 days-NAD-steady gait

## 2016-07-17 NOTE — ED Provider Notes (Signed)
[facility]   CSN: [id number] Arrival date & time: 07/17/16  1201     History   Chief Complaint Chief Complaint  Patient presents with  . Dysuria    HPI Tiffany Whitney is a 36 y.o. female s/p hysterectomy who presents with chief complaint sudden onset, progressively worsening dysuria and frequency which began 2 days ago. Patient states symptoms began 2 evenings ago with significant worsening today. She states she feels "burning all the time "as well as suprapubic abdominal pain that is crampy in nature. She states this feels somewhat different from typical UTI for her. She states she has not tried anything for her symptoms. She has a known ovarian cyst on the right side that was identified several years ago, the patient states that on follow[place] in [place] years ago she was told that it was getting smaller.   Denies hematuria, vaginal itching, discharge, or bleeding, n/v/d, fevers, chills, CP, SOB, HA.   The history is provided by the patient.    Past Medical History:  Diagnosis Date  . Anxiety   . Asthma    as a child- no inhaler  . Bipolar 1 disorder (HCC)   . Borderline personality disorder   . Depression   . Fibromyalgia 04/2012  . Menorrhagia   . Migraine   . Otitis media   . PTSD (post-traumatic stress disorder)   . Restless leg syndrome   . Strep throat   . Vertigo     Patient Active Problem List   Diagnosis Date Noted  . Pelvic pain in female 10/08/2012  . History of PID 04/12/2012    Past Surgical History:  Procedure Laterality Date  . ABDOMINAL HYSTERECTOMY    . BILATERAL SALPINGECTOMY Bilateral 10/08/2012   Procedure: BILATERAL SALPINGECTOMY;  Surgeon: Willodean Rosenthalarolyn Harraway-Smith, MD;  Location: [facility];  Service: Gynecology;  Laterality: Bilateral;  . CHOLECYSTECTOMY    . LAPAROSCOPIC ASSISTED VAGINAL HYSTERECTOMY N/A 10/08/2012   Procedure: LAPAROSCOPIC ASSISTED VAGINAL HYSTERECTOMY;  Surgeon: Willodean Rosenthalarolyn Harraway-Smith, MD;  Location:  [facility];  Service: Gynecology;  Laterality: N/A;  . TONSILLECTOMY    . TUBAL LIGATION    . TYMPANOSTOMY      OB History    Gravida Para Term Preterm AB Living   6 3 2 1 3 3    SAB TAB Ectopic Multiple Live Births   3               Home Medications    Prior to Admission medications   Medication Sig Start Date End Date Taking? Authorizing Provider  HydrOXYzine Pamoate (VISTARIL PO) Take by mouth.   Yes [provider]  MECLIZINE HCL PO Take by mouth.   Yes [provider]  Aspirin-Acetaminophen-Caffeine (GOODY HEADACHE PO) Take 1 packet by mouth daily as needed (aches/pain).    [provider]  busPIRone (BUSPAR) 10 MG tablet Take 20 mg by mouth 3 (three) times daily.     [provider]  desvenlafaxine (PRISTIQ) 50 MG 24 hr tablet Take 50 mg by mouth daily.    [provider]  metroNIDAZOLE (FLAGYL) 500 MG tablet Take 1 tablet (500 mg total) by mouth 2 (two) times daily. 07/17/16 07/24/16  Michela PitcherFawze, Caryl Fate A, PA-C  OXcarbazepine (TRILEPTAL) 150 MG tablet Take 150 mg by mouth 2 (two) times daily.    [provider]    Family History Family History  Problem Relation Age of Onset  . Depression Mother     Social History Social History  Substance Use Topics  . Smoking status: Current Every Day Smoker    Packs/day: 1.00    Years: 15.00    Types: Cigarettes  . Smokeless tobacco: Never Used  . Alcohol use No     Allergies   Morphine and related and Reglan [metoclopramide]   Review of Systems Review of Systems  Constitutional: Negative for chills and fever.  Respiratory: Negative for shortness of breath.   Gastrointestinal: Positive for abdominal pain. Negative for blood in stool, diarrhea, nausea and vomiting.  Genitourinary: Positive for dysuria and frequency. Negative for vaginal bleeding, vaginal discharge and vaginal pain.  Neurological: Negative for headaches.  All other systems reviewed and are  negative.    Physical Exam Updated Vital Signs BP 102/62   Pulse 72   Temp 98.7 F (37.1 C) (Oral)   Resp 16   Ht 5' (1.524 m)   Wt 69.9 kg   LMP 10/02/2012   SpO2 100%   BMI 30.08 kg/m   Physical Exam  Constitutional: She appears well-developed and well-nourished. No distress.  HENT:  Head: Normocephalic and atraumatic.  Eyes: Conjunctivae are normal.  Neck: Neck supple. No JVD present. No tracheal deviation present.  Cardiovascular: Normal rate, regular rhythm and normal heart sounds.   No murmur heard. Pulmonary/Chest: Effort normal and breath sounds normal. No respiratory distress.  Abdominal: Soft. Bowel sounds are normal. There is tenderness.  Right lower quadrant TTP, mild suprapubic tenderness. Negative Murphy's, negative psoas, negative rovsing's, no ttp at Mcburney's point.   Genitourinary:  Genitourinary Comments: No cva tenderness. Examination performed in the presence of a chaperone. No lesions or sores to the external genitalia. Moderate white thick vaginal discharge in the vaginal vault. Cervix is absent. No bleeding or masses noted. Pain on insertion of speculum. No motion tenderness. Right adnexal tenderness. Mild suprapubic tenderness.   Musculoskeletal: She exhibits no edema.  Neurological: She is alert.  Skin: Skin is warm and dry.  Psychiatric: She has a normal mood and affect. Her behavior is normal.  Nursing note and vitals reviewed.    ED Treatments / Results  Labs (all labs ordered are listed, but only abnormal results are displayed) Labs Reviewed  WET PREP, GENITAL - Abnormal; Notable for the following:       Result Value   Clue Cells Wet Prep HPF POC PRESENT (*)    WBC, Wet Prep HPF POC FEW (*)    All other components within normal limits  URINALYSIS, ROUTINE W REFLEX MICROSCOPIC - Abnormal; Notable for the following:    APPearance CLOUDY (*)    All other components within normal limits  COMPREHENSIVE METABOLIC PANEL - Abnormal; Notable  for the following:    AST 14 (*)    ALT 12 (*)    All other components within normal limits  URINE CULTURE  PREGNANCY, URINE  CBC WITH DIFFERENTIAL/PLATELET  GC/CHLAMYDIA PROBE AMP ([place]) NOT AT [facility]    EKG  EKG Interpretation None       Radiology Ct Abdomen Pelvis W Contrast  Result Date: 07/17/2016 CLINICAL DATA:  RLQ pain and burning with urination x 1 week, tubal, hysterectomy, bilateral salpingectomy, chole., bipolar, menorrhagia EXAM: CT ABDOMEN AND PELVIS WITH CONTRAST TECHNIQUE: Multidetector CT imaging of the abdomen and pelvis was performed using the standard protocol following bolus administration of intravenous contrast. CONTRAST:  ISOVUE-300 IOPAMIDOL (ISOVUE-300) INJECTION 61% COMPARISON:  06/26/2014 FINDINGS: Lower chest: Lung bases are clear. Hepatobiliary: No focal hepatic lesion. Postcholecystectomy. No biliary dilatation. Pancreas: Pancreas is  normal. No ductal dilatation. No pancreatic inflammation. Spleen: Normal spleen Adrenals/urinary tract: Adrenal glands kidneys normal. Ureters and bladder normal. Stomach/Bowel: Stomach, small bowel, appendix, and cecum are normal. The colon and rectosigmoid colon are normal. Vascular/Lymphatic: Abdominal aorta is normal caliber. There is no retroperitoneal or periportal lymphadenopathy. No pelvic lymphadenopathy. Reproductive: Post hysterectomy.  Ovaries normal Other: No free fluid. Musculoskeletal: No aggressive osseous lesion. IMPRESSION: 1. No explanation for RIGHT lower quadrant pain.  Normal appendix. 2. Post cholecystectomy and hysterectomy.  Ovaries appear normal. 3. No abnormality of bowel identified. 4. No obstructive uropathy. Electronically Signed   By: Genevive Bi M.D.   On: 07/17/2016 15:02    Procedures Procedures (including critical care time)  Medications Ordered in ED Medications  iopamidol (ISOVUE-300) 61 % injection 100 mL (100 mLs Intravenous Contrast Given 07/17/16 1440)     Initial  Impression / Assessment and Plan / ED Course  I have reviewed the triage vital signs and the nursing notes.  Pertinent labs & imaging results that were available during my care of the patient were reviewed by me and considered in my medical decision making (see chart for details).     Patient with urinary symptoms. Afebrile, vital signs stable. UA not concerning for UTI, sent for culture. Patient tender to palpation in the right lower quadrant. Obtained CT which was unremarkable. Low suspicion of appendicitis, colitis, TOA, ectopic pregnancy. Wet prep shows clue cells and WBCs; patient with BV. Remaining tests sent for culture, informed patient that she will be notified if results are abnormal. Given Rx for Flagyl. Discussed strict ED return precautions. Pt verbalized understanding of and agreement with plan and is safe for discharge home at this time.   Final Clinical Impressions(s) / ED Diagnoses   Final diagnoses:  BV (bacterial vaginosis)    New Prescriptions Discharge Medication List as of 07/17/2016  3:14 PM    START taking these medications   Details  metroNIDAZOLE (FLAGYL) 500 MG tablet Take 1 tablet (500 mg total) by mouth 2 (two) times daily., Starting Mon 07/17/2016, Until Mon 07/24/2016, Print         Luevenia Maxin, [place] A, PA-C 07/17/16 1956    Geoffery Lyons, MD 07/24/16 [phone]

## 2016-07-17 NOTE — Discharge Instructions (Signed)
Please take all of your antibiotics until finished!   You may develop abdominal discomfort or diarrhea from the antibiotic.  You may help offset this with probiotics which you can buy or get in yogurt. Do not eat or take the probiotics until 2 hours after your antibiotic.   You may take Tylenol or ibuprofen for pain. May apply heating pad for comfort. Follow-up with primary care or OB/GYN for reevaluation of your symptoms do not improve or return to the ED if any concerning symptoms develop.

## 2016-07-18 LAB — GC/CHLAMYDIA PROBE AMP (~~LOC~~) NOT AT ARMC
Chlamydia: NEGATIVE
Neisseria Gonorrhea: NEGATIVE

## 2016-07-19 LAB — URINE CULTURE: Culture: 40000 — AB

## 2016-07-20 ENCOUNTER — Telehealth: Payer: Self-pay | Admitting: *Deleted

## 2016-07-20 NOTE — Telephone Encounter (Signed)
Post ED Visit - Positive Culture Follow-up  Culture report reviewed by antimicrobial stewardship pharmacist:  []  Enzo BiNathan Batchelder, Pharm.D. []  Celedonio MiyamotoJeremy Frens, Pharm.D., BCPS AQ-ID []  Garvin FilaMike Maccia, Pharm.D., BCPS []  Georgina PillionElizabeth Martin, Pharm.D., BCPS []  [place]airsburgMinh Pham, [address] BoulevardPharm.D., BCPS, AAHIVP []  Estella HuskMichelle Turner, Pharm.D., BCPS, AAHIVP []  Lysle Pearlachel Rumbarger, PharmD, BCPS []  Casilda Carlsaylor Stone, PharmD, BCPS []  Pollyann SamplesAndy Johnston, PharmD, BCPS   Buel ReamAlexandra Law, PA-C  Positive urine culture Treated with Metronidazole, organism sensitive to the same and no further patient follow-up is required at this time.  Virl AxeRobertson, Simmie Garin [facility] 07/20/2016, 11:29 AM

## 2019-01-30 ENCOUNTER — Emergency Department (HOSPITAL_BASED_OUTPATIENT_CLINIC_OR_DEPARTMENT_OTHER)
Admission: EM | Admit: 2019-01-30 | Discharge: 2019-01-30 | Disposition: A | Discharge status: Home / Self Care | Payer: Self-pay | Attending: Emergency Medicine | Admitting: Emergency Medicine

## 2019-01-30 ENCOUNTER — Encounter (HOSPITAL_BASED_OUTPATIENT_CLINIC_OR_DEPARTMENT_OTHER): Payer: Self-pay | Admitting: Emergency Medicine

## 2019-01-30 ENCOUNTER — Emergency Department (HOSPITAL_BASED_OUTPATIENT_CLINIC_OR_DEPARTMENT_OTHER): Payer: Self-pay

## 2019-01-30 ENCOUNTER — Other Ambulatory Visit: Payer: Self-pay

## 2019-01-30 DIAGNOSIS — M3210 Systemic lupus erythematosus, organ or system involvement unspecified: Secondary | ICD-10-CM | POA: Insufficient documentation

## 2019-01-30 DIAGNOSIS — Z888 Allergy status to other drugs, medicaments and biological substances status: Secondary | ICD-10-CM | POA: Insufficient documentation

## 2019-01-30 DIAGNOSIS — R531 Weakness: Secondary | ICD-10-CM | POA: Insufficient documentation

## 2019-01-30 DIAGNOSIS — Z885 Allergy status to narcotic agent status: Secondary | ICD-10-CM | POA: Insufficient documentation

## 2019-01-30 DIAGNOSIS — F17210 Nicotine dependence, cigarettes, uncomplicated: Secondary | ICD-10-CM | POA: Insufficient documentation

## 2019-01-30 DIAGNOSIS — Z880 Allergy status to penicillin: Secondary | ICD-10-CM | POA: Insufficient documentation

## 2019-01-30 DIAGNOSIS — R197 Diarrhea, unspecified: Secondary | ICD-10-CM | POA: Insufficient documentation

## 2019-01-30 DIAGNOSIS — R1084 Generalized abdominal pain: Secondary | ICD-10-CM | POA: Insufficient documentation

## 2019-01-30 DIAGNOSIS — M797 Fibromyalgia: Secondary | ICD-10-CM | POA: Insufficient documentation

## 2019-01-30 DIAGNOSIS — Z79899 Other long term (current) drug therapy: Secondary | ICD-10-CM | POA: Insufficient documentation

## 2019-01-30 HISTORY — DX: Unspecified osteoarthritis, unspecified site: M19.90

## 2019-01-30 HISTORY — DX: Reserved for concepts with insufficient information to code with codable children: IMO0002

## 2019-01-30 HISTORY — DX: Systemic lupus erythematosus, unspecified: M32.9

## 2019-01-30 LAB — COMPREHENSIVE METABOLIC PANEL
ALT: 67 U/L — ABNORMAL HIGH (ref 0–44)
AST: 66 U/L — ABNORMAL HIGH (ref 15–41)
Albumin: 4 g/dL (ref 3.5–5.0)
Alkaline Phosphatase: 58 U/L (ref 38–126)
Anion gap: 9 (ref 5–15)
BUN: 22 mg/dL — ABNORMAL HIGH (ref 6–20)
CO2: 19 mmol/L — ABNORMAL LOW (ref 22–32)
Calcium: 9.6 mg/dL (ref 8.9–10.3)
Chloride: 103 mmol/L (ref 98–111)
Creatinine, Ser: 1.4 mg/dL — ABNORMAL HIGH (ref 0.44–1.00)
GFR calc Af Amer: 55 mL/min — ABNORMAL LOW (ref >60)
GFR calc non Af Amer: 47 mL/min — ABNORMAL LOW (ref >60)
Glucose, Bld: 112 mg/dL — ABNORMAL HIGH (ref 70–99)
Potassium: 3.3 mmol/L — ABNORMAL LOW (ref 3.5–5.1)
Sodium: 131 mmol/L — ABNORMAL LOW (ref 135–145)
Total Bilirubin: 0.4 mg/dL (ref 0.3–1.2)
Total Protein: 7.4 g/dL (ref 6.5–8.1)

## 2019-01-30 LAB — OCCULT BLOOD X 1 CARD TO LAB, STOOL: Fecal Occult Bld: POSITIVE — AB

## 2019-01-30 LAB — CBC WITH DIFFERENTIAL/PLATELET
Abs Immature Granulocytes: 0.05 10*3/uL (ref 0.00–0.07)
Basophils Absolute: 0.1 10*3/uL (ref 0.0–0.1)
Basophils Relative: 1 %
Eosinophils Absolute: 0.5 10*3/uL (ref 0.0–0.5)
Eosinophils Relative: 5 %
HCT: 42.5 % (ref 36.0–46.0)
Hemoglobin: 14.7 g/dL (ref 12.0–15.0)
Immature Granulocytes: 1 %
Lymphocytes Relative: 14 %
Lymphs Abs: 1.4 10*3/uL (ref 0.7–4.0)
MCH: 32.6 pg (ref 26.0–34.0)
MCHC: 34.6 g/dL (ref 30.0–36.0)
MCV: 94.2 fL (ref 80.0–100.0)
Monocytes Absolute: 1 10*3/uL (ref 0.1–1.0)
Monocytes Relative: 10 %
Neutro Abs: 7.4 10*3/uL (ref 1.7–7.7)
Neutrophils Relative %: 69 %
Platelets: 362 10*3/uL (ref 150–400)
RBC: 4.51 MIL/uL (ref 3.87–5.11)
RDW: 11.8 % (ref 11.5–15.5)
WBC: 10.4 10*3/uL (ref 4.0–10.5)
nRBC: 0 % (ref 0.0–0.2)

## 2019-01-30 LAB — LIPASE, BLOOD: Lipase: 34 U/L (ref 11–51)

## 2019-01-30 LAB — LACTIC ACID, PLASMA: Lactic Acid, Venous: 1.7 mmol/L (ref 0.5–1.9)

## 2019-01-30 MED ORDER — DICYCLOMINE HCL 20 MG PO TABS: 20.0000 mg | ORAL_TABLET | Freq: Two times a day (BID) | ORAL | 0 refills | Status: AC

## 2019-01-30 MED ORDER — CIPROFLOXACIN HCL 250 MG PO TABS: 250.0000 mg | ORAL_TABLET | Freq: Two times a day (BID) | ORAL | 0 refills | Status: DC

## 2019-01-30 MED ORDER — SODIUM CHLORIDE 0.9 % IV BOLUS
1000.0000 mL | Freq: Once | INTRAVENOUS | Status: AC
  Administered 2019-01-30: 1000 mL via INTRAVENOUS

## 2019-01-30 MED ORDER — METRONIDAZOLE 500 MG PO TABS: 500.0000 mg | ORAL_TABLET | Freq: Three times a day (TID) | ORAL | 0 refills | Status: AC

## 2019-01-30 MED ORDER — FENTANYL CITRATE (PF) 100 MCG/2ML IJ SOLN
50.0000 ug | Freq: Once | INTRAMUSCULAR | Status: AC
  Administered 2019-01-30: 19:00:00 50 ug via INTRAVENOUS
  Filled 2019-01-30: qty 2

## 2019-01-30 MED ORDER — IOHEXOL 300 MG/ML  SOLN
80.0000 mL | Freq: Once | INTRAMUSCULAR | Status: AC | PRN
  Administered 2019-01-30: 80 mL via INTRAVENOUS

## 2019-01-30 MED ORDER — POTASSIUM CHLORIDE ER 10 MEQ PO TBCR: 10.0000 meq | EXTENDED_RELEASE_TABLET | Freq: Every day | ORAL | 0 refills | Status: AC

## 2019-01-30 MED ORDER — SODIUM CHLORIDE 0.9 % IV BOLUS: 1000.0000 mL | Freq: Once | INTRAVENOUS | Status: DC

## 2019-01-30 MED ORDER — CIPROFLOXACIN HCL 500 MG PO TABS: 500.0000 mg | ORAL_TABLET | Freq: Two times a day (BID) | ORAL | 0 refills | Status: AC

## 2019-01-30 MED ORDER — ONDANSETRON HCL 4 MG PO TABS: 4.0000 mg | ORAL_TABLET | Freq: Four times a day (QID) | ORAL | 0 refills | Status: AC

## 2019-01-30 NOTE — Discharge Instructions (Addendum)
Take Cipro and Flagyl as prescribed.  Do not do any high impact exercise or running while taking Cipro as it can increase risk for tendon rupture.  Take Bentyl twice daily for abdominal cramping.  Take Zofran every 6 hours as needed for nausea or vomiting.  You will be called if you test positive for any abnormality in your GI panel.  Please follow-up with your doctor on return home.  Please return the emergency department if you develop any new or worsening symptoms.

## 2019-01-30 NOTE — ED Triage Notes (Signed)
Pt reports bloody diarrhea x 3 days with abd cramping.

## 2019-01-30 NOTE — ED Notes (Signed)
Pt IS TOLERATING poS WELL

## 2019-01-30 NOTE — ED Provider Notes (Signed)
[facility] EMERGENCY DEPARTMENT Provider Note   CSN: [id number] Arrival date & time: 01/30/19  1639     History   Chief Complaint Chief Complaint  Patient presents with   Diarrhea    HPI Tiffany Whitney is a 38 y.o. female with history of bipolar 1 disorder, BPD, lupus, fibromyalgia who presents with a 3-day history of abdominal pain and bloody diarrhea.  Patient has had associated generalized weakness over the past day or so.  She denies any nausea or vomiting, back pain, urinary symptoms, fever, chest pain, shortness of breath, cough, loss of taste or smell.  She recently traveled from [place].  She denies eating any abnormal foods.  No interventions taken prior to arrival.  No recent antibiotic use.     HPI  Past Medical History:  Diagnosis Date   Anxiety    Arthritis    Asthma    as a child- no inhaler   Bipolar 1 disorder (HCC)    Borderline personality disorder (HCC)    Depression    Fibromyalgia 04/2012   Lupus (HCC)    Menorrhagia    Migraine    Otitis media    PTSD (post-traumatic stress disorder)    Restless leg syndrome    Strep throat    Vertigo     Patient Active Problem List   Diagnosis Date Noted   Pelvic pain in female 10/08/2012   History of PID 04/12/2012    Past Surgical History:  Procedure Laterality Date   ABDOMINAL HYSTERECTOMY     BILATERAL SALPINGECTOMY Bilateral 10/08/2012   Procedure: BILATERAL SALPINGECTOMY;  Surgeon: Willodean Rosenthal, MD;  Location: [facility];  Service: Gynecology;  Laterality: Bilateral;   CHOLECYSTECTOMY     LAPAROSCOPIC ASSISTED VAGINAL HYSTERECTOMY N/A 10/08/2012   Procedure: LAPAROSCOPIC ASSISTED VAGINAL HYSTERECTOMY;  Surgeon: Willodean Rosenthal, MD;  Location: [facility];  Service: Gynecology;  Laterality: N/A;   TONSILLECTOMY     TUBAL LIGATION     TYMPANOSTOMY       OB History    Gravida  6   Para  3   Term  2   Preterm  1   AB  3   Living  3     SAB  3    TAB      Ectopic      Multiple      Live Births               Home Medications    Prior to Admission medications   Medication Sig Start Date End Date Taking? Authorizing Provider  Aspirin-Acetaminophen-Caffeine (GOODY HEADACHE PO) Take 1 packet by mouth daily as needed (aches/pain).    [provider]  busPIRone (BUSPAR) 10 MG tablet Take 20 mg by mouth 3 (three) times daily.     [provider]  ciprofloxacin (CIPRO) 500 MG tablet Take 1 tablet (500 mg total) by mouth every 12 (twelve) hours. 01/30/19   Arizbeth Cawthorn, Waylan Boga, PA-C  desvenlafaxine (PRISTIQ) 50 MG 24 hr tablet Take 50 mg by mouth daily.    [provider]  dicyclomine (BENTYL) 20 MG tablet Take 1 tablet (20 mg total) by mouth 2 (two) times daily. 01/30/19   Elverda Wendel, Waylan Boga, PA-C  HydrOXYzine Pamoate (VISTARIL PO) Take by mouth.    [provider]  MECLIZINE HCL PO Take by mouth.    [provider]  metroNIDAZOLE (FLAGYL) 500 MG tablet Take 1 tablet (500 mg total) by mouth 3 (three) times daily. 01/30/19  Jawuan Robb M, PA-C  ondansetron (ZOFRAN) 4 MG tablet Take 1 tablet (4 mg total) by mouth every 6 (six) hours. 01/30/19   Lyan Holck, Waylan BogaAlexandra M, PA-C  OXcarbazepine (TRILEPTAL) 150 MG tablet Take 150 mg by mouth 2 (two) times daily.    [provider]  potassium chloride (KLOR-CON) 10 MEQ tablet Take 1 tablet (10 mEq total) by mouth daily. 01/30/19   Emi HolesLaw, Adanely Reynoso M, PA-C    Family History Family History  Problem Relation Age of Onset   Depression Mother     Social History Social History   Tobacco Use   Smoking status: Current Every Day Smoker    Packs/day: 1.00    Years: 15.00    Pack years: 15.00    Types: Cigarettes   Smokeless tobacco: Never Used  Substance Use Topics   Alcohol use: No   Drug use: No     Allergies   Morphine and related, Amoxicillin, and Reglan [metoclopramide]   Review of Systems Review of Systems  Constitutional:  Negative for chills and fever.  HENT: Negative for facial swelling and sore throat.   Respiratory: Negative for shortness of breath.   Cardiovascular: Negative for chest pain.  Gastrointestinal: Positive for abdominal pain, blood in stool and diarrhea. Negative for nausea and vomiting.  Genitourinary: Negative for dysuria.  Musculoskeletal: Negative for back pain.  Skin: Negative for rash and wound.  Neurological: Negative for headaches.  Psychiatric/Behavioral: The patient is not nervous/anxious.      Physical Exam Updated Vital Signs BP 103/71    Pulse 93    Temp 98.5 F (36.9 C) (Oral)    Resp 18    Ht 5\' 1"  (1.549 m)    Wt 68 kg    LMP 10/02/2012    SpO2 100%    BMI 28.34 kg/m   Physical Exam Vitals signs and nursing note reviewed.  Constitutional:      General: She is not in acute distress.    Appearance: She is well-developed. She is not diaphoretic.  HENT:     Head: Normocephalic and atraumatic.     Mouth/Throat:     Pharynx: No oropharyngeal exudate.  Eyes:     General: No scleral icterus.       Right eye: No discharge.        Left eye: No discharge.     Conjunctiva/sclera: Conjunctivae normal.     Pupils: Pupils are equal, round, and reactive to light.  Neck:     Musculoskeletal: Normal range of motion and neck supple.     Thyroid: No thyromegaly.  Cardiovascular:     Rate and Rhythm: Normal rate and regular rhythm.     Heart sounds: Normal heart sounds. No murmur. No friction rub. No gallop.   Pulmonary:     Effort: Pulmonary effort is normal. No respiratory distress.     Breath sounds: Normal breath sounds. No stridor. No wheezing or rales.  Abdominal:     General: Bowel sounds are normal. There is no distension.     Palpations: Abdomen is soft.     Tenderness: There is abdominal tenderness in the periumbilical area. There is guarding. There is no right CVA tenderness, left CVA tenderness or rebound.    Lymphadenopathy:     Cervical: No cervical  adenopathy.  Skin:    General: Skin is warm and dry.     Coloration: Skin is not pale.     Findings: No rash.  Neurological:     Mental Status:  She is alert.     Coordination: Coordination normal.      ED Treatments / Results  Labs (all labs ordered are listed, but only abnormal results are displayed) Labs Reviewed  COMPREHENSIVE METABOLIC PANEL - Abnormal; Notable for the following components:      Result Value   Sodium 131 (*)    Potassium 3.3 (*)    CO2 19 (*)    Glucose, Bld 112 (*)    BUN 22 (*)    Creatinine, Ser 1.40 (*)    AST 66 (*)    ALT 67 (*)    GFR calc non Af Amer 47 (*)    GFR calc Af Amer 55 (*)    All other components within normal limits  OCCULT BLOOD X 1 CARD TO LAB, STOOL - Abnormal; Notable for the following components:   Fecal Occult Bld POSITIVE (*)    All other components within normal limits  C DIFFICILE QUICK SCREEN W PCR REFLEX  CBC WITH DIFFERENTIAL/PLATELET  LACTIC ACID, PLASMA  LIPASE, BLOOD    EKG None  Radiology Ct Abdomen Pelvis W Contrast  Result Date: 01/30/2019 CLINICAL DATA:  Acute abdominal pain, generalized abdominal pain with bloody diarrhea. EXAM: CT ABDOMEN AND PELVIS WITH CONTRAST TECHNIQUE: Multidetector CT imaging of the abdomen and pelvis was performed using the standard protocol following bolus administration of intravenous contrast. CONTRAST:  66mL OMNIPAQUE IOHEXOL 300 MG/ML  SOLN COMPARISON:  06/26/2014 and 07/17/2016 FINDINGS: Lower chest: Lung bases are clear. Hepatobiliary: No signs of suspicious focal hepatic lesion. Portal vein is patent. Post cholecystectomy without biliary ductal distension. Pancreas: Unremarkable. No pancreatic ductal dilatation or surrounding inflammatory changes. Spleen: Normal in size without focal abnormality. Adrenals/Urinary Tract: Adrenal glands are normal. The kidneys enhance symmetrically without signs of focal, suspicious lesion. No signs of hydronephrosis. Stomach/Bowel: No signs of  bowel obstruction or acute bowel process. Diffuse colonic thickening with mural stratification and mucosal hyperenhancement. Mild appendiceal thickening is noted though the colon is the most abnormal finding on today's study with findings of pancolitis. Vascular/Lymphatic: Scattered atherosclerosis. Calcified and noncalcified plaque. No signs of adenopathy in the abdomen or retroperitoneum. No pelvic lymphadenopathy. Reproductive: Findings of hysterectomy. Appearance of adnexa unchanged since prior exam. Ovaries remaining in place. Other: No signs of free air. No signs of ascites. Musculoskeletal: No sign of acute bone finding or evidence of destructive bone process. IMPRESSION: 1. Diffuse colonic thickening with mural stratification and mucosal hyperenhancement, consistent with pancolitis, likely infectious, correlate with any recent antibiotic administration or clinical signs of C difficile colitis. The distribution could also be seen with inflammatory bowel disease. 2. Mild appendiceal thickening likely secondary to diffuse colonic findings. 3. Aortic atherosclerosis. Aortic Atherosclerosis (ICD10-I70.0). Electronically Signed   By: Zetta Bills M.D.   On: 01/30/2019 18:33    Procedures Procedures (including critical care time)  Medications Ordered in ED Medications  sodium chloride 0.9 % bolus 1,000 mL (has no administration in time range)  sodium chloride 0.9 % bolus 1,000 mL (0 mLs Intravenous Stopped 01/30/19 1936)  iohexol (OMNIPAQUE) 300 MG/ML solution 80 mL (80 mLs Intravenous Contrast Given 01/30/19 1759)  fentaNYL (SUBLIMAZE) injection 50 mcg (50 mcg Intravenous Given 01/30/19 1832)     Initial Impression / Assessment and Plan / ED Course  I have reviewed the triage vital signs and the nursing notes.  Pertinent labs & imaging results that were available during my care of the patient were reviewed by me and considered in my medical decision making (see  chart for details).         Patient presenting with several day history of bloody diarrhea.  Patient was feeling very fatigued.  She is found to to have pancolitis.  Low suspicion of C. difficile as patient does not have any risk factors.  Suspect another infectious cause.  Patient has an AKI, however unsure of patient's baseline.  IV fluids given and patient is feeling much better.  Will discharge home with potassium supplementation as patient mildly hypokalemic at 3.3.  Radiologist also reported possibility of inflammatory bowel disease.  Will treat with Cipro and Flagyl at this time and advise follow-up to PCP versus GI symptoms are not improving.  Will discharge home with Zofran and Bentyl.  Return precautions discussed.  Patient understands and agrees with plan.  Patient vitals stable ED course and discharged in satisfactory condition.  Patient also evaluated by my attending, Dr. Stevie Kern, who guided the patient's management and agrees with plan.  Final Clinical Impressions(s) / ED Diagnoses   Final diagnoses:  Diarrhea in adult patient    ED Discharge Orders         Ordered    ondansetron (ZOFRAN) 4 MG tablet  Every 6 hours     01/30/19 1942    ciprofloxacin (CIPRO) 250 MG tablet  Every 12 hours,   Status:  Discontinued     01/30/19 1942    metroNIDAZOLE (FLAGYL) 500 MG tablet  3 times daily     01/30/19 1942    dicyclomine (BENTYL) 20 MG tablet  2 times daily     01/30/19 1942    ciprofloxacin (CIPRO) 500 MG tablet  Every 12 hours     01/30/19 1943    potassium chloride (KLOR-CON) 10 MEQ tablet  Daily     01/30/19 2014           Emi Holes, PA-C 01/30/19 2015    Milagros Loll, MD 01/31/19 1600

## 2019-01-30 NOTE — ED Notes (Signed)
Patient transported to CT 

## 2019-01-31 ENCOUNTER — Telehealth (HOSPITAL_BASED_OUTPATIENT_CLINIC_OR_DEPARTMENT_OTHER): Payer: Self-pay | Admitting: Emergency Medicine
